# Patient Record
Sex: Male | Born: 1945
Health system: Southern US, Community
[De-identification: ages and names within clinical notes are randomized; demographics above are authoritative.]

## PROBLEM LIST (undated history)

## (undated) DIAGNOSIS — I219 Acute myocardial infarction, unspecified: Secondary | ICD-10-CM

## (undated) DIAGNOSIS — I2510 Atherosclerotic heart disease of native coronary artery without angina pectoris: Secondary | ICD-10-CM

## (undated) DIAGNOSIS — I739 Peripheral vascular disease, unspecified: Secondary | ICD-10-CM

## (undated) DIAGNOSIS — M1990 Unspecified osteoarthritis, unspecified site: Secondary | ICD-10-CM

## (undated) DIAGNOSIS — I639 Cerebral infarction, unspecified: Secondary | ICD-10-CM

## (undated) HISTORY — DX: Unspecified osteoarthritis, unspecified site: M19.90

## (undated) HISTORY — PX: CARDIAC CATHETERIZATION: SHX172

## (undated) HISTORY — PX: VASCULAR SURGERY: SHX849

## (undated) HISTORY — PX: CORONARY ARTERY BYPASS GRAFT: SHX141

---

## 2000-06-18 ENCOUNTER — Emergency Department (HOSPITAL_COMMUNITY): Admission: EM | Admit: 2000-06-18 | Discharge: 2000-06-18 | Payer: Self-pay | Admitting: Emergency Medicine

## 2005-03-29 ENCOUNTER — Ambulatory Visit (HOSPITAL_COMMUNITY): Admission: RE | Admit: 2005-03-29 | Discharge: 2005-03-29 | Payer: Self-pay | Admitting: Gastroenterology

## 2005-03-29 ENCOUNTER — Encounter (INDEPENDENT_AMBULATORY_CARE_PROVIDER_SITE_OTHER): Payer: Self-pay | Admitting: *Deleted

## 2009-12-24 DIAGNOSIS — I219 Acute myocardial infarction, unspecified: Secondary | ICD-10-CM

## 2009-12-24 HISTORY — DX: Acute myocardial infarction, unspecified: I21.9

## 2010-04-29 ENCOUNTER — Ambulatory Visit: Payer: Self-pay | Admitting: Internal Medicine

## 2010-04-29 ENCOUNTER — Inpatient Hospital Stay (HOSPITAL_COMMUNITY)
Admission: EM | Admit: 2010-04-29 | Discharge: 2010-05-06 | Payer: Self-pay | Source: Home / Self Care | Admitting: Emergency Medicine

## 2010-04-30 ENCOUNTER — Encounter: Payer: Self-pay | Admitting: Cardiovascular Disease

## 2010-04-30 ENCOUNTER — Ambulatory Visit: Payer: Self-pay | Admitting: Thoracic Surgery (Cardiothoracic Vascular Surgery)

## 2010-05-01 ENCOUNTER — Encounter: Payer: Self-pay | Admitting: Cardiovascular Disease

## 2010-05-08 ENCOUNTER — Ambulatory Visit: Payer: Self-pay | Admitting: Thoracic Surgery (Cardiothoracic Vascular Surgery)

## 2010-05-15 ENCOUNTER — Ambulatory Visit: Payer: Self-pay | Admitting: Thoracic Surgery (Cardiothoracic Vascular Surgery)

## 2010-05-23 ENCOUNTER — Ambulatory Visit: Payer: Self-pay | Admitting: Thoracic Surgery (Cardiothoracic Vascular Surgery)

## 2010-05-29 ENCOUNTER — Ambulatory Visit: Payer: Self-pay | Admitting: Thoracic Surgery (Cardiothoracic Vascular Surgery)

## 2010-05-29 ENCOUNTER — Encounter
Admission: RE | Admit: 2010-05-29 | Discharge: 2010-05-29 | Payer: Self-pay | Admitting: Thoracic Surgery (Cardiothoracic Vascular Surgery)

## 2010-06-01 ENCOUNTER — Encounter (HOSPITAL_COMMUNITY)
Admission: RE | Admit: 2010-06-01 | Discharge: 2010-08-30 | Payer: Self-pay | Source: Home / Self Care | Admitting: Interventional Cardiology

## 2010-08-24 ENCOUNTER — Ambulatory Visit (HOSPITAL_COMMUNITY): Admission: RE | Admit: 2010-08-24 | Discharge: 2010-08-24 | Payer: Self-pay | Admitting: Interventional Cardiology

## 2010-09-04 ENCOUNTER — Ambulatory Visit: Payer: Self-pay | Admitting: Surgery

## 2010-09-22 ENCOUNTER — Ambulatory Visit: Payer: Self-pay | Admitting: Surgery

## 2010-09-22 ENCOUNTER — Inpatient Hospital Stay (HOSPITAL_COMMUNITY)
Admission: RE | Admit: 2010-09-22 | Discharge: 2010-09-26 | Payer: Self-pay | Source: Home / Self Care | Admitting: Surgery

## 2010-09-22 ENCOUNTER — Encounter: Payer: Self-pay | Admitting: Surgery

## 2010-09-22 HISTORY — PX: FEMORAL BYPASS: SHX50

## 2010-10-02 ENCOUNTER — Ambulatory Visit: Payer: Self-pay | Admitting: Surgery

## 2010-10-16 ENCOUNTER — Ambulatory Visit: Payer: Self-pay | Admitting: Surgery

## 2010-11-06 ENCOUNTER — Ambulatory Visit: Payer: Self-pay | Admitting: Surgery

## 2010-12-04 ENCOUNTER — Ambulatory Visit: Payer: Self-pay | Admitting: Surgery

## 2011-01-15 ENCOUNTER — Ambulatory Visit: Admit: 2011-01-15 | Payer: Self-pay | Admitting: Surgery

## 2011-01-15 ENCOUNTER — Ambulatory Visit
Admission: RE | Admit: 2011-01-15 | Discharge: 2011-01-15 | Payer: Self-pay | Source: Home / Self Care | Attending: Surgery | Admitting: Surgery

## 2011-01-16 NOTE — Assessment & Plan Note (Addendum)
OFFICE VISIT  [name], [name] DOB:  [birth year]                                       01/15/2011 JJOAC#:[id number]  The patient returns today for followup.  He is status post left femoral to above-knee popliteal bypass with reversed ipsilateral greater saphenous vein.  Simultaneously he underwent endarterectomy of left common femoral and profunda femoral artery for high-grade profunda femoral stenosis.  His postoperative  course was complicated by slight wound separation in the distal incision which has now subsequently healed.  He is back to work and without significant complaints.  He does have issues with some left leg swelling if he is on his feet all day.  I have given him a prescription for compression stockings; however, he has not been reliable in wearing these.  PHYSICAL EXAMINATION:  Heart rate 60, blood pressure 127/79, O2 sats 98%.  General:  Well-appearing, in no distress.  Respirations are nonlabored.  On his left leg, his incisions are well-healed.  He does have 1 to 2+ pitting edema of the left leg, no active ulceration.  DIAGNOSTIC STUDIES:  Ultrasound was performed today which shows an ABI of 1.2 on the left and 1.2 on the right.  Bypass graft is widely patent without evidence of stenosis.  ASSESSMENT:  Status post left leg bypass.  PLAN:  The patient will be placed on our ultrasound protocol.  I will plan on seeing him back in 6 months.  I have again reiterated to him to wear his compression stockings or at least keep his leg elevated when he is on his feet all day.    Jorge Ny, MD Electronically Signed  VWB/MEDQ  D:  01/15/2011  T:  01/16/2011  Job:  [id number]  cc:   Corky Crafts, MD

## 2011-01-18 IMAGING — CR DG CHEST 1V PORT
1 series · 1 of 1 positions shown · non-contrast
Comparison: 09/12/2010 and 05/29/2010

CLINICAL DATA: Elevated temperature.  Claudication.

PORTABLE CHEST - 1 VIEW

[AP]
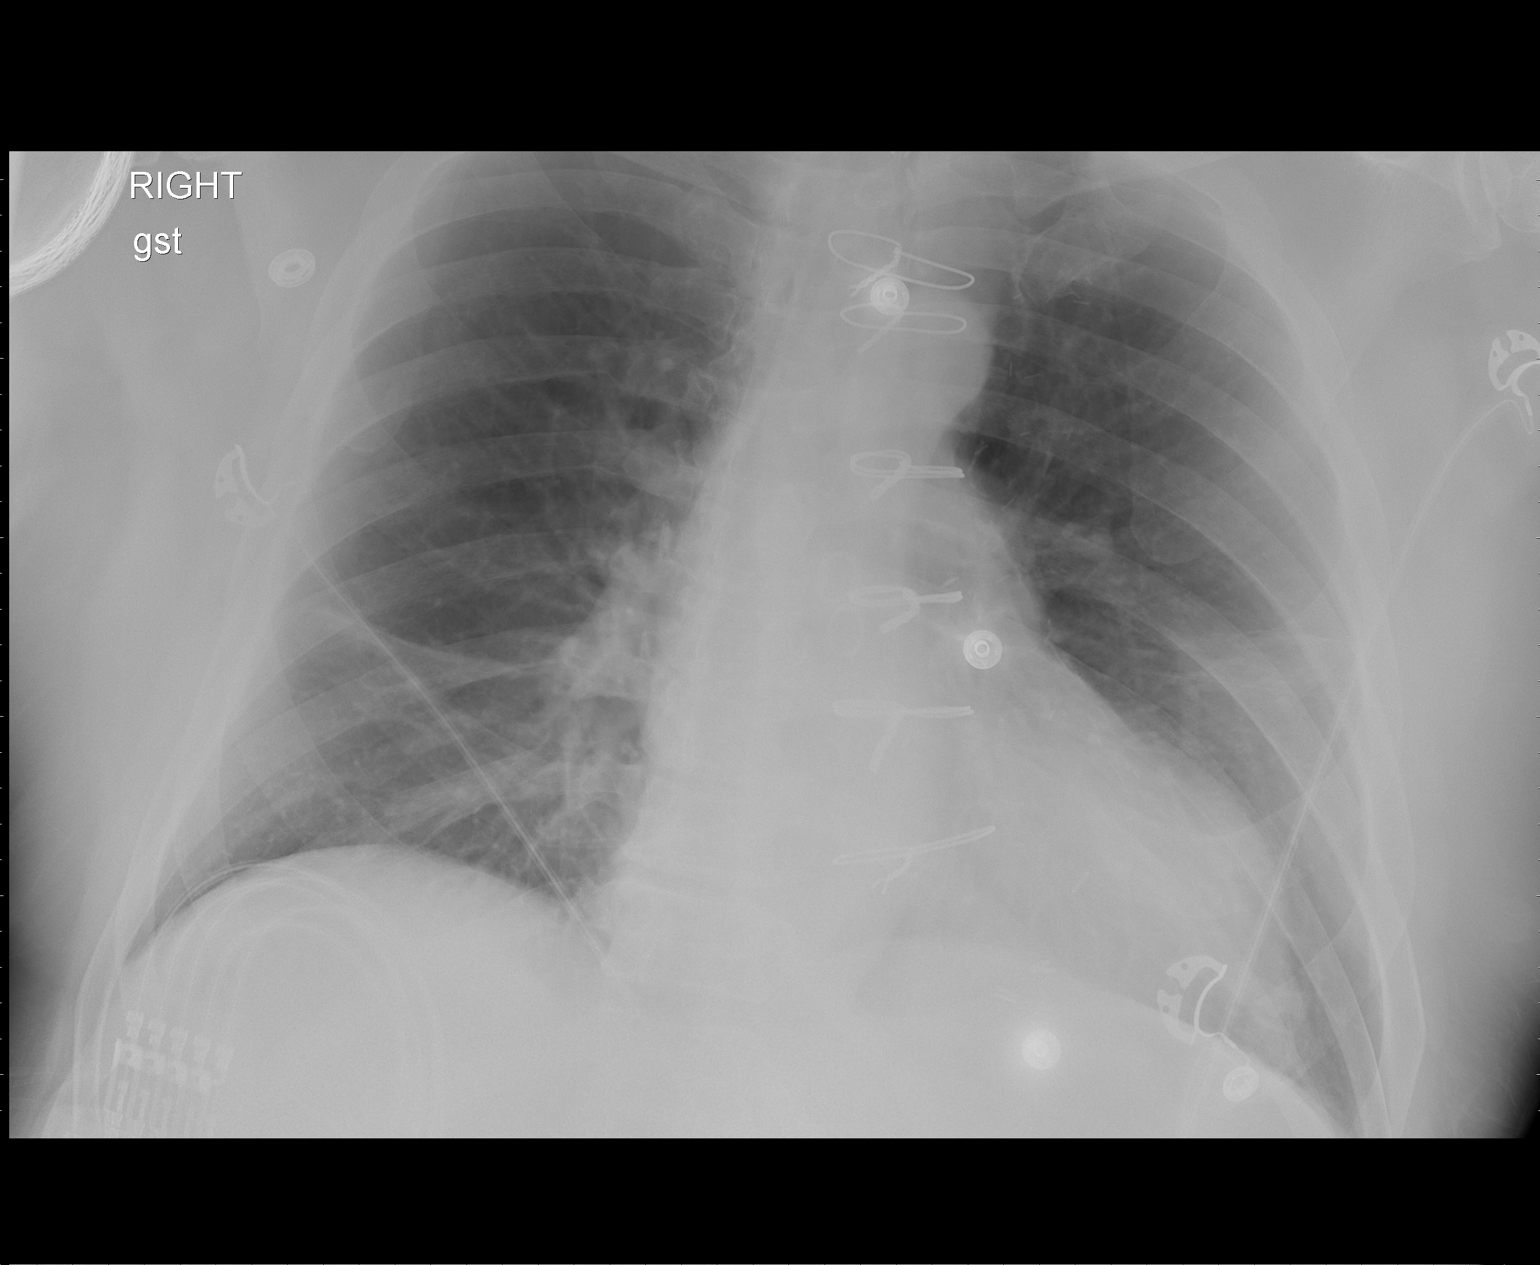

[1 of 1 positions shown; findings below may reference images not displayed]

FINDINGS: Single view of the chest demonstrates a median
sternotomy.  Streaky densities in the left mid lung and right lower
lung.  Findings may represent subsegmental atelectasis.  No
evidence for pulmonary edema.  Heart size is upper limits of normal
but stable.  Osseous structures are intact.
IMPRESSION: Bilateral streaky linear densities that may represent atelectasis.

## 2011-01-22 NOTE — Procedures (Unsigned)
BYPASS GRAFT EVALUATION  INDICATION:  Left lower extremity bypass.  HISTORY: Diabetes:  No. Cardiac:  Yes. Hypertension:  Yes. Smoking:  Quit 14 years ago. Previous Surgery:  Left fem-pop graft 09/22/2010.  SINGLE LEVEL ARTERIAL EXAM                              RIGHT              LEFT Brachial:                    136                138 Anterior tibial:             153                159 Posterior tibial:            169                171 Peroneal: Ankle/brachial index:        1.22               1.24  PREVIOUS ABI:  Date:  10/02/2010  RIGHT:  1.16  LEFT:  1.08  LOWER EXTREMITY BYPASS GRAFT DUPLEX EXAM:  DUPLEX:  Widely patent left fem-pop graft without evidence of restenosis or hyperplasia although a distal anastomosis is difficult to visualize. Triphasic waveforms throughout.  IMPRESSION: 1. Patent left femoral-popliteal graft. 2. Normal ankle brachial indices bilaterally.  ___________________________________________ V. Charlena Cross, MD  LT/MEDQ  D:  01/15/2011  T:  01/15/2011  Job:  [id number]

## 2011-01-24 DIAGNOSIS — I639 Cerebral infarction, unspecified: Secondary | ICD-10-CM

## 2011-01-24 HISTORY — DX: Cerebral infarction, unspecified: I63.9

## 2011-01-28 ENCOUNTER — Emergency Department (HOSPITAL_COMMUNITY): Payer: Medicare Other

## 2011-01-28 ENCOUNTER — Inpatient Hospital Stay (HOSPITAL_COMMUNITY)
Admission: EM | Admit: 2011-01-28 | Discharge: 2011-02-07 | DRG: 246 | Disposition: A | Discharge status: Rehabilitation Facility | Payer: Medicare Other | Attending: Interventional Cardiology | Admitting: Interventional Cardiology

## 2011-01-28 DIAGNOSIS — R079 Chest pain, unspecified: Secondary | ICD-10-CM

## 2011-01-28 DIAGNOSIS — Z951 Presence of aortocoronary bypass graft: Secondary | ICD-10-CM

## 2011-01-28 DIAGNOSIS — Z7902 Long term (current) use of antithrombotics/antiplatelets: Secondary | ICD-10-CM

## 2011-01-28 DIAGNOSIS — I2510 Atherosclerotic heart disease of native coronary artery without angina pectoris: Principal | ICD-10-CM | POA: Diagnosis present

## 2011-01-28 DIAGNOSIS — IMO0002 Reserved for concepts with insufficient information to code with codable children: Secondary | ICD-10-CM | POA: Diagnosis present

## 2011-01-28 DIAGNOSIS — I739 Peripheral vascular disease, unspecified: Secondary | ICD-10-CM | POA: Diagnosis present

## 2011-01-28 DIAGNOSIS — I6340 Cerebral infarction due to embolism of unspecified cerebral artery: Secondary | ICD-10-CM | POA: Diagnosis not present

## 2011-01-28 DIAGNOSIS — G8190 Hemiplegia, unspecified affecting unspecified side: Secondary | ICD-10-CM | POA: Diagnosis not present

## 2011-01-28 DIAGNOSIS — Z7982 Long term (current) use of aspirin: Secondary | ICD-10-CM

## 2011-01-28 DIAGNOSIS — E119 Type 2 diabetes mellitus without complications: Secondary | ICD-10-CM | POA: Diagnosis present

## 2011-01-28 DIAGNOSIS — I10 Essential (primary) hypertension: Secondary | ICD-10-CM | POA: Diagnosis present

## 2011-01-28 LAB — DIFFERENTIAL
Basophils Relative: 0 % (ref 0–1)
Eosinophils Absolute: 0.3 10*3/uL (ref 0.0–0.7)
Eosinophils Relative: 5 % (ref 0–5)
Monocytes Absolute: 0.8 10*3/uL (ref 0.1–1.0)
Monocytes Relative: 15 % — ABNORMAL HIGH (ref 3–12)

## 2011-01-28 LAB — COMPREHENSIVE METABOLIC PANEL
CO2: 29 mEq/L (ref 19–32)
Calcium: 9 mg/dL (ref 8.4–10.5)
Creatinine, Ser: 1.26 mg/dL (ref 0.4–1.5)
GFR calc Af Amer: >60 mL/min (ref >60)
GFR calc non Af Amer: 57 mL/min — ABNORMAL LOW (ref >60)
Glucose, Bld: 108 mg/dL — ABNORMAL HIGH (ref 70–99)

## 2011-01-28 LAB — CBC
Hemoglobin: 15.2 g/dL (ref 13.0–17.0)
MCH: 30.5 pg (ref 26.0–34.0)
MCHC: 33 g/dL (ref 30.0–36.0)
RDW: 14.1 % (ref 11.5–15.5)

## 2011-01-28 LAB — CK TOTAL AND CKMB (NOT AT ARMC)
CK, MB: 3.4 ng/mL (ref 0.3–4.0)
Total CK: 180 U/L (ref 7–232)

## 2011-01-28 LAB — URINALYSIS, ROUTINE W REFLEX MICROSCOPIC
Bilirubin Urine: NEGATIVE
Hgb urine dipstick: NEGATIVE
Protein, ur: NEGATIVE mg/dL
Urine Glucose, Fasting: NEGATIVE mg/dL

## 2011-01-28 LAB — POCT CARDIAC MARKERS
Myoglobin, poc: 134 ng/mL (ref 12–200)
Troponin i, poc: <0.05 ng/mL (ref 0.00–0.09)

## 2011-01-28 LAB — PROTIME-INR: Prothrombin Time: 13.3 seconds (ref 11.6–15.2)

## 2011-01-28 LAB — TROPONIN I: Troponin I: 0.02 ng/mL (ref 0.00–0.06)

## 2011-01-29 ENCOUNTER — Inpatient Hospital Stay (HOSPITAL_COMMUNITY): Payer: Medicare Other

## 2011-01-29 LAB — HEMOGLOBIN A1C
Hgb A1c MFr Bld: 6.4 % — ABNORMAL HIGH (ref <5.7)
Mean Plasma Glucose: 137 mg/dL — ABNORMAL HIGH (ref <117)

## 2011-01-29 LAB — CARDIAC PANEL(CRET KIN+CKTOT+MB+TROPI)
CK, MB: 2.2 ng/mL (ref 0.3–4.0)
Relative Index: 1.6 (ref 0.0–2.5)
Relative Index: 1.6 (ref 0.0–2.5)
Troponin I: 0.01 ng/mL (ref 0.00–0.06)

## 2011-01-29 LAB — BASIC METABOLIC PANEL
BUN: 9 mg/dL (ref 6–23)
BUN: 8 mg/dL (ref 6–23)
Calcium: 8.7 mg/dL (ref 8.4–10.5)
Calcium: 8.5 mg/dL (ref 8.4–10.5)
Creatinine, Ser: 1.25 mg/dL (ref 0.4–1.5)
GFR calc non Af Amer: 59 mL/min — ABNORMAL LOW (ref >60)
GFR calc non Af Amer: 58 mL/min — ABNORMAL LOW (ref >60)
Glucose, Bld: 103 mg/dL — ABNORMAL HIGH (ref 70–99)

## 2011-01-29 LAB — GLUCOSE, CAPILLARY
Glucose-Capillary: 92 mg/dL (ref 70–99)
Glucose-Capillary: 109 mg/dL — ABNORMAL HIGH (ref 70–99)

## 2011-01-29 LAB — PROTIME-INR
INR: 1.91 — ABNORMAL HIGH (ref 0.00–1.49)
Prothrombin Time: 22 seconds — ABNORMAL HIGH (ref 11.6–15.2)

## 2011-01-29 LAB — CBC
HCT: 44.2 % (ref 39.0–52.0)
Hemoglobin: 14.2 g/dL (ref 13.0–17.0)
Platelets: 218 10*3/uL (ref 150–400)
RBC: 4.78 MIL/uL (ref 4.22–5.81)
RDW: 14.1 % (ref 11.5–15.5)
WBC: 6.5 10*3/uL (ref 4.0–10.5)

## 2011-01-29 LAB — LIPID PANEL
Cholesterol: 138 mg/dL (ref 0–200)
HDL: 35 mg/dL — ABNORMAL LOW (ref >39)
LDL Cholesterol: 86 mg/dL (ref 0–99)
Total CHOL/HDL Ratio: 3.9 RATIO
Triglycerides: 85 mg/dL (ref <150)

## 2011-01-29 LAB — MRSA PCR SCREENING: MRSA by PCR: NEGATIVE

## 2011-01-29 LAB — APTT: aPTT: 68 seconds — ABNORMAL HIGH (ref 24–37)

## 2011-01-30 ENCOUNTER — Inpatient Hospital Stay (HOSPITAL_COMMUNITY): Payer: Medicare Other

## 2011-01-30 LAB — LIPID PANEL
LDL Cholesterol: 100 mg/dL — ABNORMAL HIGH (ref 0–99)
Triglycerides: 74 mg/dL (ref <150)
VLDL: 15 mg/dL (ref 0–40)

## 2011-01-30 LAB — BASIC METABOLIC PANEL
Calcium: 8.8 mg/dL (ref 8.4–10.5)
Creatinine, Ser: 1.27 mg/dL (ref 0.4–1.5)
GFR calc Af Amer: >60 mL/min (ref >60)
GFR calc non Af Amer: 57 mL/min — ABNORMAL LOW (ref >60)
Glucose, Bld: 85 mg/dL (ref 70–99)
Sodium: 138 mEq/L (ref 135–145)

## 2011-01-30 LAB — GLUCOSE, CAPILLARY
Glucose-Capillary: 83 mg/dL (ref 70–99)
Glucose-Capillary: 79 mg/dL (ref 70–99)
Glucose-Capillary: 67 mg/dL — ABNORMAL LOW (ref 70–99)
Glucose-Capillary: 88 mg/dL (ref 70–99)
Glucose-Capillary: 68 mg/dL — ABNORMAL LOW (ref 70–99)

## 2011-01-30 LAB — CBC
MCH: 30.5 pg (ref 26.0–34.0)
MCHC: 33.3 g/dL (ref 30.0–36.0)
RDW: 14.3 % (ref 11.5–15.5)

## 2011-01-30 LAB — POCT ACTIVATED CLOTTING TIME: Activated Clotting Time: 629 seconds

## 2011-01-31 ENCOUNTER — Other Ambulatory Visit (HOSPITAL_COMMUNITY): Payer: MEDICARE

## 2011-01-31 LAB — CBC
MCH: 31.6 pg (ref 26.0–34.0)
MCHC: 34.4 g/dL (ref 30.0–36.0)
MCV: 91.9 fL (ref 78.0–100.0)
Platelets: 229 10*3/uL (ref 150–400)
RDW: 14 % (ref 11.5–15.5)

## 2011-01-31 LAB — BASIC METABOLIC PANEL
BUN: 10 mg/dL (ref 6–23)
Calcium: 8.8 mg/dL (ref 8.4–10.5)
Chloride: 101 mEq/L (ref 96–112)
Creatinine, Ser: 1.29 mg/dL (ref 0.4–1.5)
GFR calc Af Amer: >60 mL/min (ref >60)

## 2011-01-31 LAB — GLUCOSE, CAPILLARY
Glucose-Capillary: 89 mg/dL (ref 70–99)
Glucose-Capillary: 92 mg/dL (ref 70–99)
Glucose-Capillary: 73 mg/dL (ref 70–99)

## 2011-01-31 LAB — MRSA PCR SCREENING: MRSA by PCR: NEGATIVE

## 2011-01-31 LAB — PROTIME-INR: Prothrombin Time: 13.8 seconds (ref 11.6–15.2)

## 2011-02-01 ENCOUNTER — Inpatient Hospital Stay (HOSPITAL_COMMUNITY): Payer: Medicare Other

## 2011-02-01 DIAGNOSIS — R209 Unspecified disturbances of skin sensation: Secondary | ICD-10-CM

## 2011-02-01 DIAGNOSIS — I69921 Dysphasia following unspecified cerebrovascular disease: Secondary | ICD-10-CM

## 2011-02-01 DIAGNOSIS — I69998 Other sequelae following unspecified cerebrovascular disease: Secondary | ICD-10-CM

## 2011-02-01 DIAGNOSIS — G8100 Flaccid hemiplegia affecting unspecified side: Secondary | ICD-10-CM

## 2011-02-01 LAB — GLUCOSE, CAPILLARY
Glucose-Capillary: 76 mg/dL (ref 70–99)
Glucose-Capillary: 80 mg/dL (ref 70–99)
Glucose-Capillary: 86 mg/dL (ref 70–99)
Glucose-Capillary: 89 mg/dL (ref 70–99)
Glucose-Capillary: 93 mg/dL (ref 70–99)

## 2011-02-01 MED ORDER — IOHEXOL 300 MG/ML  SOLN
200.0000 mL | Freq: Once | INTRAMUSCULAR | Status: AC | PRN
  Administered 2011-02-01: 110 mL via INTRAVENOUS

## 2011-02-02 LAB — GLUCOSE, CAPILLARY
Glucose-Capillary: 118 mg/dL — ABNORMAL HIGH (ref 70–99)
Glucose-Capillary: 109 mg/dL — ABNORMAL HIGH (ref 70–99)
Glucose-Capillary: 110 mg/dL — ABNORMAL HIGH (ref 70–99)
Glucose-Capillary: 185 mg/dL — ABNORMAL HIGH (ref 70–99)

## 2011-02-03 LAB — BASIC METABOLIC PANEL
CO2: 26 mEq/L (ref 19–32)
Calcium: 8.7 mg/dL (ref 8.4–10.5)
Chloride: 106 mEq/L (ref 96–112)
Creatinine, Ser: 1.31 mg/dL (ref 0.4–1.5)
Glucose, Bld: 137 mg/dL — ABNORMAL HIGH (ref 70–99)
Sodium: 138 mEq/L (ref 135–145)

## 2011-02-03 LAB — GLUCOSE, CAPILLARY
Glucose-Capillary: 127 mg/dL — ABNORMAL HIGH (ref 70–99)
Glucose-Capillary: 137 mg/dL — ABNORMAL HIGH (ref 70–99)
Glucose-Capillary: 119 mg/dL — ABNORMAL HIGH (ref 70–99)
Glucose-Capillary: 119 mg/dL — ABNORMAL HIGH (ref 70–99)

## 2011-02-03 LAB — CBC
HCT: 45.4 % (ref 39.0–52.0)
Hemoglobin: 15.7 g/dL (ref 13.0–17.0)
MCH: 31.2 pg (ref 26.0–34.0)
MCHC: 34.6 g/dL (ref 30.0–36.0)
RBC: 5.04 MIL/uL (ref 4.22–5.81)

## 2011-02-04 LAB — GLUCOSE, CAPILLARY
Glucose-Capillary: 126 mg/dL — ABNORMAL HIGH (ref 70–99)
Glucose-Capillary: 92 mg/dL (ref 70–99)

## 2011-02-04 NOTE — H&P (Signed)
NAME:  [name], [name] NO.:  [id number]  MEDICAL RECORD NO.:  [id number]           PATIENT TYPE:  I  LOCATION:  2025                         FACILITY:  [facility]  PHYSICIAN:  Marca Ancona, MD      DATE OF BIRTH:  02-15-1946  DATE OF ADMISSION:  01/28/2011 DATE OF DISCHARGE:                             HISTORY & PHYSICAL   PRIMARY CARDIOLOGIST:  Lyn Records, MD  PRIMARY CARE PHYSICIAN:  Georgann Housekeeper, MD.  HISTORY OF PRESENT ILLNESS:  This is a 64-year with history of hypertension, diabetes, peripheral arterial disease and coronary artery disease, status post bypass surgery in May 2011 who presents with chest pain.  For the last 2-3 days, the patient has had occasional episodes of chest tightness that been mild and nonexertional for the most part. However, all morning today, the patient had very mild 2/10 chest tightness that came and went.  This seemed to resolve by the afternoon. However, tonight the patient ate a salad at [employer] __________ then went outside to feed his dog.  While walking back inside, after this, he developed 5/10 chest tightness similar to his prior ischemic pain but not as severe.  EMS came.  The patient was given one nitroglycerin, which helped but did not resolve the pain.  He came to the ER where his pain resolved after a total of three sublingual nitroglycerin.  He is currently completely chest painfree.  ALLERGIES:  No known drug allergies.  MEDICATIONS: 1. Glipizide 2.5 mg b.i.d. 2. Lipitor 20 mg daily. 3. Atenolol 50 mg daily. 4. Aspirin 325 mg daily.  PAST MEDICAL HISTORY: 1. Hypertension. 2. Diabetes. 3. Hyperlipidemia. 4. Peripheral arterial disease. 5. The patient status post left femoral-popliteal bypass. 6. He is also status post endarterectomy at the left common femoral     and profunda femoris arteries. 7. Echocardiogram in May 2011 showed EF 55-60% with normal wall motion 8. Coronary artery disease.   The patient had an inferior MI with     angioplasty in 1990s.  He was admitted with a STEMI in May 2011     likely due to plaque rupture in the circumflex.  He was found on     cath to have severe three-vessel disease and was taken to bypass     surgery with a LIMA to LAD, vein graft to diagonal, vein graft to     obtuse marginal, and sequential saphenous vein graft to the acute     marginal, and the PDA.  SOCIAL HISTORY:  The patient lives in [place] with his wife.  He owns his own business.  He does not smoke or drink any alcohol.  FAMILY HISTORY:  Noncontributory.  REVIEW OF SYSTEMS:  All systems were reviewed and were negative except as noted in history of present illness.  PHYSICAL EXAMINATION:  VITAL SIGNS:  Temperature is 98.6, pulse is in 60s to 70s, blood pressure initially 167/75, decreased to 134/86, oxygen saturation 99% on room air. GENERAL:  A well-developed man in no apparent distress. HEENT:  Normal exam. ABDOMEN:  Soft, nontender.  No hepatosplenomegaly.  Normal bowel sounds. EXTREMITIES:  No clubbing  or cyanosis. NECK:  There is no thyromegaly or thyroid nodule.  There is no JVD. CARDIOVASCULAR:  Heart regular S1 and S2.  No S3, no S4.  There is no murmur.  There is 1+ edema to the knee on the left.  This has been chronic since surgery.  There is minimal edema on the right.  The patient has 2+ posterior tibial pulses bilaterally. LUNGS:  Clear to auscultation bilaterally with normal respiratory effort. NEUROLOGIC:  Alert and orient x3.  Normal affect. SKIN:  Normal exam.  RADIOLOGY:  Chest x-ray shows clear lungs.  EKG shows normal sinus rhythm with old inferior and anterior myocardial infarctions.  The anterior MI is new compared to the May 2011 EKG.  LABORATORY DATA:  Hematocrit 46, potassium 4.3, creatinine 1.26.  First set of cardiac markers is negative.  IMPRESSION:  A 65 year old with history of diabetes, hypertension, peripheral arterial disease,  and coronary artery disease, status post coronary artery bypass grafting who presents with chest pain, worrisome for unstable angina. 1. Chest pain.  This is possible unstable angina.  The patient is     currently chest painfree.  He has had one set of negative cardiac     enzymes.  EKG shows old inferior and anterior myocardial     infarctions.  I am concerned that the pain feels like his prior     myocardial infarction in May though not severe.  We will plan on     cycling his cardiac enzymes as well as EKGs.  We are going to admit     him to a telemetry bed.  We will start him on aspirin, heparin     drip, and continue on a statin and beta-blocker.  We will keep him     n.p.o. now for either a catheterization or Myoview in the morning.     Given the similarity of his symptoms to prior ischemic symptoms and     the relief on nitroglycerin, I think that a catheterization would     certainly be reasonable. 2. Peripheral arterial disease.  This seems to be stable.  The patient     has goo peripheral pulses.  He has had left greater than right     lower leg edema since his last vascular surgery on the left leg,     and there has been no change in this.     Marca Ancona, MD     DM/MEDQ  D:  01/28/2011  T:  01/29/2011  Job:  [id number]  Electronically Signed by Marca Ancona MD on 02/01/2011 08:50:50 AM

## 2011-02-05 ENCOUNTER — Inpatient Hospital Stay (HOSPITAL_COMMUNITY): Payer: Medicare Other

## 2011-02-05 LAB — GLUCOSE, CAPILLARY
Glucose-Capillary: 115 mg/dL — ABNORMAL HIGH (ref 70–99)
Glucose-Capillary: 131 mg/dL — ABNORMAL HIGH (ref 70–99)
Glucose-Capillary: 94 mg/dL (ref 70–99)

## 2011-02-05 LAB — BASIC METABOLIC PANEL
BUN: 16 mg/dL (ref 6–23)
Calcium: 8.6 mg/dL (ref 8.4–10.5)
Chloride: 111 mEq/L (ref 96–112)
Creatinine, Ser: 1.14 mg/dL (ref 0.4–1.5)
GFR calc non Af Amer: >60 mL/min (ref >60)

## 2011-02-06 LAB — GLUCOSE, CAPILLARY
Glucose-Capillary: 92 mg/dL (ref 70–99)
Glucose-Capillary: 97 mg/dL (ref 70–99)

## 2011-02-06 NOTE — Procedures (Signed)
NAME:  [name], [name]            ACCOUNT NO.:  [id number]  MEDICAL RECORD NO.:  [id number]           PATIENT TYPE:  I  LOCATION:  2912                         FACILITY:  [facility]  PHYSICIAN:  Lyn Records, M.D.   DATE OF BIRTH:  [birth year]  DATE OF PROCEDURE:  01/29/2011 DATE OF DISCHARGE:                           CARDIAC CATHETERIZATION   INDICATION FOR THE PROCEDURE:  Recurrent chest pain, post bypass surgery with sequential saphenous vein graft to the right coronary, saphenous vein graft to the diagonal, saphenous vein graft to the OM, and LIMA to the LAD in May 2011.  PROCEDURES PERFORMED: 1. Left heart cath. 2. Coronary angio. 3. Left ventriculography. 4. Bypass graft angiography. 5. LIMA angiography. 6. PTCA and stent using cutting balloon and stent to treat diagonal     LAD bifurcation disease in unprotected LAD territory.  DESCRIPTION:  After informed consent, the patient was brought to the cath lab where the right iliofemoral region was sterilely prepped and draped.  A 5-French sheath was placed in the right femoral artery.  We then performed diagnostic left heart catheterization, left ventriculography, and bypass graft angiography using a 5 F A2 multipurpose catheter.  We used a 5F internal mammary catheter for left internal mammary artery angiography.  He was given 1 mg of Versed and 50 mcg of fentanyl intravenously at the beginning of the case.  The patient became very groggy, started snoring, became hypoxic requiring O2 therapy, and began having involuntary arm and leg movement.  We gave additional Versed which seemed to make things worse.  We ultimately gave Romazicon 0.4 mg IV to reverse Versed(see below). This did incompletely improve the restlessness. He was arousable and able to follow commands.  We decided to perform PCI of his left anterior descending/diagonal after we identified that the LIMA to the LAD was atretic, the saphenous vein graft to  the diagonal was occluded. We also noted the saphenous vein graft to the right coronary had moderate to severe mid vessel obstruction. We discussed the findings and plans for stenting the LAD/diagonal with the patient before proceeding.  We used an XB LAD 6-French #4 guide catheter.  We used a two-wire technique to protect the diagonal and LAD.  Angiomax bolus followed by an infusion produced an ACT greater than 300.  Plavix 600 mg was administered orally.  We performed cutting balloon angioplasty using a 2.25 x 10- mm long cutting balloon.  We then did angioplasty followed by stenting of the LAD.  A 3.0 Promus Element stent was deployed in the LAD.  The diagonal was not treated with stenting.  We postdilated to 3.5 using a Poneto apex balloon to 16 atmospheres.    During the PCI, the patient became increasingly difficult to control on the table.  He could not control movement of his legs. We gave Romazicon during the PCI in an attempt to reverse the Versed thinking that he was having an adverse reaction (as noted above). The procedure was completed with difficulty and angio-Seal  was performed postprocedure to facilitate groin hemostasis with good result.  RESULTS: 1. Hemodynamic data:     a.  Aortic pressure 131/80.     b.     Left ventricular pressure 134/90 mmHg. 2. Left ventriculography:  LV cavity size is dilated.  Inferior wall     was severely hypokinetic.  EF was 45%. 3. Coronary angiography:     a.     Left main coronary:  50% distal stenosis.     b.     Left anterior descending coronary artery:  Left anterior      descending coronary artery contains 80% proximal eccentric      stenosis.  The diagonal that arises from this territory contains      90% segmental ostial stenosis and 90% mid. The distal LAD contains      90% obstruction.     c.     Circumflex artery:  99% proximal. Competitive flow from the patent graft was noted.     d.     Right coronary:  99% mid with  competitive flow distally. 4. Coronary angiography:     a.     Saphenous vein graft sequential to the RCA:  This graft      contains 70% mid graft body stenosis.     b.     Saphenous vein graft to the diagonal:  100% aorto-ostial.     c.     Saphenous vein graft to the OM:  Widely patent. 5. LIMA to LAD:  Occluded in the misd vessel. 6. PCI of the LAD diagonal:  The diagonal was angioplastied from 95%     to 25% using a 2.25 x 10-mm long cutting balloon.  One balloon     inflation for 1 minute 30 seconds was performed.  The LAD contained     85% stenosis and was reduced to 0% after stenting with a Promus     Element 3.0 stent, postdilated to 3.90mm.  RESULTS: 1. Coronary atherosclerotic heart disease with occlusion of the     saphenous vein graft to the diagonal, and 70% stenosis of the     saphenous vein graft to the right coronary. 2. Atretic LIMA to LAD. 3. 80-85% proximal LAD, 90% first diagonal with distal disease in the LAD     as well.  The proximal circumflex has high-grade disease, but is     grafted.  The right coronary is severely diseased, but has a patent     graft, although there is moderate and perhaps significant stenosis     in the mid graft. 4. Successful drug-eluting stent LAD.  Successful cutting balloon     angioplasty diagonal #1. 5. Left ventricular dysfunction. 6. Postprocedure neurological abnormalities were noted.  I was     concerned that the patient developed an adverse reaction to     medications, although we have called a code stroke to rule out the     possibility of embolic CVA related to his cath.  PLAN: 1. Emergency CT. 2. Neurology. 3. Continue aspirin and Plavix. 4. ICU after scanning. 5. Discuss with family in full disclosure.     Lyn Records, M.D.     HWS/MEDQ  D:  01/29/2011  T:  01/30/2011  Job:  [id number]  cc:   Georgann Housekeeper, MD  Electronically Signed by Verdis Prime M.D. on 01/31/2011 04:54:09 PM

## 2011-02-07 ENCOUNTER — Inpatient Hospital Stay (HOSPITAL_COMMUNITY)
Admission: RE | Admit: 2011-02-07 | Discharge: 2011-03-07 | DRG: 945 | Disposition: A | Discharge status: Home / Self Care | Payer: Medicare Other | Source: Other Acute Inpatient Hospital | Attending: Physical Medicine & Rehabilitation | Admitting: Physical Medicine & Rehabilitation

## 2011-02-07 DIAGNOSIS — Z7982 Long term (current) use of aspirin: Secondary | ICD-10-CM

## 2011-02-07 DIAGNOSIS — M109 Gout, unspecified: Secondary | ICD-10-CM

## 2011-02-07 DIAGNOSIS — Z7902 Long term (current) use of antithrombotics/antiplatelets: Secondary | ICD-10-CM

## 2011-02-07 DIAGNOSIS — Z8249 Family history of ischemic heart disease and other diseases of the circulatory system: Secondary | ICD-10-CM

## 2011-02-07 DIAGNOSIS — R1310 Dysphagia, unspecified: Secondary | ICD-10-CM

## 2011-02-07 DIAGNOSIS — Z951 Presence of aortocoronary bypass graft: Secondary | ICD-10-CM

## 2011-02-07 DIAGNOSIS — I6322 Cerebral infarction due to unspecified occlusion or stenosis of basilar arteries: Secondary | ICD-10-CM

## 2011-02-07 DIAGNOSIS — I6330 Cerebral infarction due to thrombosis of unspecified cerebral artery: Secondary | ICD-10-CM

## 2011-02-07 DIAGNOSIS — R29810 Facial weakness: Secondary | ICD-10-CM

## 2011-02-07 DIAGNOSIS — I2510 Atherosclerotic heart disease of native coronary artery without angina pectoris: Secondary | ICD-10-CM

## 2011-02-07 DIAGNOSIS — G8190 Hemiplegia, unspecified affecting unspecified side: Secondary | ICD-10-CM

## 2011-02-07 DIAGNOSIS — Z833 Family history of diabetes mellitus: Secondary | ICD-10-CM

## 2011-02-07 DIAGNOSIS — R4789 Other speech disturbances: Secondary | ICD-10-CM

## 2011-02-07 DIAGNOSIS — Z87891 Personal history of nicotine dependence: Secondary | ICD-10-CM

## 2011-02-07 DIAGNOSIS — E669 Obesity, unspecified: Secondary | ICD-10-CM

## 2011-02-07 DIAGNOSIS — E785 Hyperlipidemia, unspecified: Secondary | ICD-10-CM

## 2011-02-07 DIAGNOSIS — Z5189 Encounter for other specified aftercare: Principal | ICD-10-CM

## 2011-02-07 LAB — GLUCOSE, CAPILLARY
Glucose-Capillary: 101 mg/dL — ABNORMAL HIGH (ref 70–99)
Glucose-Capillary: 115 mg/dL — ABNORMAL HIGH (ref 70–99)
Glucose-Capillary: 97 mg/dL (ref 70–99)

## 2011-02-07 LAB — URINALYSIS, ROUTINE W REFLEX MICROSCOPIC
Hgb urine dipstick: NEGATIVE
Nitrite: NEGATIVE
Specific Gravity, Urine: 1.019 (ref 1.005–1.030)
Urobilinogen, UA: 1 mg/dL (ref 0.0–1.0)

## 2011-02-08 DIAGNOSIS — G8100 Flaccid hemiplegia affecting unspecified side: Secondary | ICD-10-CM

## 2011-02-08 DIAGNOSIS — I6340 Cerebral infarction due to embolism of unspecified cerebral artery: Secondary | ICD-10-CM

## 2011-02-08 LAB — DIFFERENTIAL
Basophils Relative: 1 % (ref 0–1)
Eosinophils Absolute: 0.4 10*3/uL (ref 0.0–0.7)
Eosinophils Relative: 6 % — ABNORMAL HIGH (ref 0–5)
Monocytes Absolute: 0.6 10*3/uL (ref 0.1–1.0)
Monocytes Relative: 10 % (ref 3–12)
Neutrophils Relative %: 62 % (ref 43–77)

## 2011-02-08 LAB — COMPREHENSIVE METABOLIC PANEL
AST: 34 U/L (ref 0–37)
Albumin: 2.8 g/dL — ABNORMAL LOW (ref 3.5–5.2)
Calcium: 8.8 mg/dL (ref 8.4–10.5)
Creatinine, Ser: 1.43 mg/dL (ref 0.4–1.5)
GFR calc Af Amer: >60 mL/min (ref >60)

## 2011-02-08 LAB — GLUCOSE, CAPILLARY
Glucose-Capillary: 87 mg/dL (ref 70–99)
Glucose-Capillary: 103 mg/dL — ABNORMAL HIGH (ref 70–99)

## 2011-02-08 LAB — CBC
MCH: 31 pg (ref 26.0–34.0)
MCHC: 34.3 g/dL (ref 30.0–36.0)
Platelets: 322 10*3/uL (ref 150–400)
RDW: 13.3 % (ref 11.5–15.5)

## 2011-02-09 LAB — GLUCOSE, CAPILLARY
Glucose-Capillary: 96 mg/dL (ref 70–99)
Glucose-Capillary: 96 mg/dL (ref 70–99)
Glucose-Capillary: 81 mg/dL (ref 70–99)

## 2011-02-09 LAB — URINE CULTURE
Colony Count: >=100000
Culture  Setup Time: 201202160129

## 2011-02-10 LAB — GLUCOSE, CAPILLARY: Glucose-Capillary: 86 mg/dL (ref 70–99)

## 2011-02-11 LAB — GLUCOSE, CAPILLARY
Glucose-Capillary: 89 mg/dL (ref 70–99)
Glucose-Capillary: 119 mg/dL — ABNORMAL HIGH (ref 70–99)

## 2011-02-12 DIAGNOSIS — G8100 Flaccid hemiplegia affecting unspecified side: Secondary | ICD-10-CM

## 2011-02-12 DIAGNOSIS — I6330 Cerebral infarction due to thrombosis of unspecified cerebral artery: Secondary | ICD-10-CM

## 2011-02-12 LAB — GLUCOSE, CAPILLARY: Glucose-Capillary: 130 mg/dL — ABNORMAL HIGH (ref 70–99)

## 2011-02-13 LAB — BASIC METABOLIC PANEL
Calcium: 9.1 mg/dL (ref 8.4–10.5)
Creatinine, Ser: 1.36 mg/dL (ref 0.4–1.5)
GFR calc Af Amer: >60 mL/min (ref >60)
GFR calc non Af Amer: 53 mL/min — ABNORMAL LOW (ref >60)

## 2011-02-13 LAB — GLUCOSE, CAPILLARY
Glucose-Capillary: 107 mg/dL — ABNORMAL HIGH (ref 70–99)
Glucose-Capillary: 87 mg/dL (ref 70–99)

## 2011-02-14 LAB — GLUCOSE, CAPILLARY: Glucose-Capillary: 94 mg/dL (ref 70–99)

## 2011-02-14 NOTE — H&P (Signed)
NAME:  [name], [name]            ACCOUNT NO.:  [id number]  MEDICAL RECORD NO.:  [id number]           PATIENT TYPE:  I  LOCATION:  4039                         FACILITY:  [facility]  PHYSICIAN:  Erick Colace, M.D.DATE OF BIRTH:  May 13, 1946  DATE OF ADMISSION:  02/07/2011 DATE OF DISCHARGE:                             HISTORY & PHYSICAL   REASON FOR ADMISSION:  Right-sided weakness.  A 65 year old male with history of coronary artery disease, underwent a CABG in May 2011.  He was admitted on January 29, 2011, with chest pain, unstable angina, underwent cardiac cath revealing left IMA occlusion. He underwent stenting with Dr. Verdis Prime.  Postprocedure, noted to have right facial weakness, slurred speech.  Code stroke initiated.  MRI of the brain showed acute infarct throughout the left PCA and small infarcts in left cerebellum, bilateral thalami and right occipital lobes.  Carotid Dopplers showed no ICA stenosis.  MRA of the brain showed severe basal artery stenosis and cerebral angiogram showed a large basilar clot.  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  The patient had right great toe pain secondary to gout, started on IV Toradol for treatment.  The patient with dysphagia, initially n.p.o., upgraded to D2 nectar liquids.  Noted to have left gaze preference, right facial weakness, dense right hemiparesis.  REVIEW OF SYSTEMS:  Positive for double vision, right-sided numbness.  PAST HISTORY:  In addition to above, dyslipidemia, obesity, colonic polyps, left lower extremity claudication status post endarterectomy, fem-pop October 2011, diverticulosis, gout.  FAMILY HISTORY:  Diabetes and CAD.  SOCIAL HISTORY:  Married, lives in two-level home, three steps to enter. Bedroom on first level.  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  Quit tobacco 15 years ago.  Negative EtOH.  FUNCTIONAL HISTORY:  Independent prior to  admission.  Meds include atenolol, Lipitor, aspirin, glipizide.  His last BUN was 8, creatinine 1.26.  Sodium 140, potassium 4.3, hemoglobin 15.2, white count 5.1.  Hemoglobin A1c 6.4.  PHYSICAL EXAMINATION:  GENERAL:  This is an obese male in no acute distress.  He appears somnolent but easily arousable to voice. VITAL SIGNS:  His blood pressure is 141/90, pulse 60, respirations 18, temp 97.7. EYES:  Anicteric.  He has mild ptosis, left.  He has right facial droop. OROPHARYNX:  Some thrush on tongue, otherwise clear. NECK:  Supple without adenopathy. RESPIRATORY:  Effort is good. LUNGS:  Clear. HEART:  Regular rate and rhythm. ABDOMEN:  Mildly distended.  Positive bowel sounds. EXTREMITIES:  Pedal pulses and posterior tibial pulses normal.  He has edema bilateral lower extremities 1+ pretibial. MUSCULOSKELETAL:  Motor strength is 0/5 in the right deltoid, biceps, triceps, finger flexor, hip flexor, quad, TA, and gastroc.  He does have some flicker of adductor on the right side, but this is not reproducible.  Left side is 4/5 strength in left upper and left lower extremity. PSYCHIATRIC:  Orientation to person.  He is oriented to [facility] but thinks he is in short stay.  He has decreased memory, flat affect. NEUROLOGIC:  Sensation is reduced on the right side, although he can identify  which finger is touched but it takes him a long time and repeated testing to identify correctly toe or finger touched on the right.  POSTADMISSION PHYSICIAN EVALUATION: 1. 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  He has right     hemiparesis, right facial weakness, dysphagia, dysarthria,     cognitive deficits. 2. This patient admitted to receive collaborative interdisciplinary     care between the physiatrist, rehab nursing staff, and therapy     team. 3. The patient's level of medical complexity and substantial therapy      needs in context of that medical necessity cannot be provided at a     lesser intensity of care. 4. The patient has experienced potential functional loss from his     baseline.  Upon functional assessment at the time of preadmission     screening, the patient was not yet tested.  Upon functional     examination today, the patient was at a +3 total assist transfers     40% with lower extremities locked.  He has difficulty with holding     and sitting at midline.  Judging by the patient's diagnosis,     physical exam, functional history, the patient has potential for     functional progress which will result in measurable gains while in     inpatient rehab.  These gains will be of substantial and practical     use upon discharge to home in facilitating mobility, self-care, and     independence.  Interim changes in medical status since preadmission     screening are detailed in history of present illness. 5. Physiatrist will provide 24-hour management of medical needs as     well as oversight of the therapy plan/treatment and provide     guidance as appropriate regarding interaction of the two. 6. The 24-Hour Rehab Nursing will assess in management of skin, bowel,     bladder, help integrate therapy concepts, techniques, education. 7. PT will assess and treat for pre-gait training, gait training,     endurance, safety, sitting balance.  Goals are for min assist     wheelchair level, mobility. 8. OT will assess and treat for ADLs, cognitive perceptual skills,     transfers, toileting.  Goals are for min assist with ADLs including     toileting. 9. Speech and Language Pathology will assess and treat for dysphagia,     dysarthria, cognition.  Goals are for safe p.o. intake, 100% of     fluid and caloric needs. 10.Case Management and social worker will assess and treat for     psychosocial issues and discharge planning. 11.Team conference will be held weekly to assess the patient's      progress/goals and to determine barriers to discharge. 12.The patient has demonstrated sufficient medical stability and     exercise capacity to tolerate at least 3 hours of therapy per day     at least 5 days per week. 13.Estimated length of stay is 3-4 weeks.  Prognosis for further     functional improvement is fair to good.  MEDICAL PROBLEMS AND PLAN: 1. Diabetes.  Add glipizide 2.5 b.i.d. 2. Hypertension.  Atenolol 25 b.i.d.  We will let blood pressures run     a little higher on the systolic side given his low-flow state     through his basilar artery. 3. Somnolence.  Consider Ritalin, this is likely due to thalamic  infarcts. 4. Baseline artery thrombosis.  Plavix and aspirin. 5. Deep vein thrombosis prophylaxis.  Lovenox.  Discussed rehab medicine services with wife and the patient, questions answered.  The patient has adequate mood and motivation, although his somnolence may be an issue in terms of participating full in this rehab program.  We will monitor overtime.     Erick Colace, M.D.     AEK/MEDQ  D:  02/07/2011  T:  02/07/2011  Job:  [id number]  cc:   Georgann Housekeeper, MD Lyn Records, M.D. Pramod P. Pearlean Brownie, MD Everardo All Madilyn Fireman, M.D.  Electronically Signed by Claudette Laws M.D. on 02/14/2011 09:56:30 AM

## 2011-02-15 LAB — GLUCOSE, CAPILLARY
Glucose-Capillary: 104 mg/dL — ABNORMAL HIGH (ref 70–99)
Glucose-Capillary: 79 mg/dL (ref 70–99)

## 2011-02-16 DIAGNOSIS — G8100 Flaccid hemiplegia affecting unspecified side: Secondary | ICD-10-CM

## 2011-02-16 DIAGNOSIS — I6330 Cerebral infarction due to thrombosis of unspecified cerebral artery: Secondary | ICD-10-CM

## 2011-02-16 LAB — GLUCOSE, CAPILLARY: Glucose-Capillary: 140 mg/dL — ABNORMAL HIGH (ref 70–99)

## 2011-02-17 DIAGNOSIS — I6330 Cerebral infarction due to thrombosis of unspecified cerebral artery: Secondary | ICD-10-CM

## 2011-02-17 DIAGNOSIS — G8100 Flaccid hemiplegia affecting unspecified side: Secondary | ICD-10-CM

## 2011-02-17 LAB — GLUCOSE, CAPILLARY
Glucose-Capillary: 163 mg/dL — ABNORMAL HIGH (ref 70–99)
Glucose-Capillary: 92 mg/dL (ref 70–99)
Glucose-Capillary: 81 mg/dL (ref 70–99)

## 2011-02-18 LAB — GLUCOSE, CAPILLARY: Glucose-Capillary: 105 mg/dL — ABNORMAL HIGH (ref 70–99)

## 2011-02-19 LAB — GLUCOSE, CAPILLARY
Glucose-Capillary: 101 mg/dL — ABNORMAL HIGH (ref 70–99)
Glucose-Capillary: 101 mg/dL — ABNORMAL HIGH (ref 70–99)

## 2011-02-20 LAB — GLUCOSE, CAPILLARY
Glucose-Capillary: 99 mg/dL (ref 70–99)
Glucose-Capillary: 76 mg/dL (ref 70–99)

## 2011-02-21 LAB — GLUCOSE, CAPILLARY: Glucose-Capillary: 112 mg/dL — ABNORMAL HIGH (ref 70–99)

## 2011-02-22 DIAGNOSIS — I6330 Cerebral infarction due to thrombosis of unspecified cerebral artery: Secondary | ICD-10-CM

## 2011-02-22 DIAGNOSIS — G8100 Flaccid hemiplegia affecting unspecified side: Secondary | ICD-10-CM

## 2011-02-22 LAB — GLUCOSE, CAPILLARY
Glucose-Capillary: 106 mg/dL — ABNORMAL HIGH (ref 70–99)
Glucose-Capillary: 144 mg/dL — ABNORMAL HIGH (ref 70–99)
Glucose-Capillary: 76 mg/dL (ref 70–99)
Glucose-Capillary: 87 mg/dL (ref 70–99)

## 2011-02-23 DIAGNOSIS — F4321 Adjustment disorder with depressed mood: Secondary | ICD-10-CM

## 2011-02-23 DIAGNOSIS — I619 Nontraumatic intracerebral hemorrhage, unspecified: Secondary | ICD-10-CM

## 2011-02-23 LAB — GLUCOSE, CAPILLARY
Glucose-Capillary: 176 mg/dL — ABNORMAL HIGH (ref 70–99)
Glucose-Capillary: 59 mg/dL — ABNORMAL LOW (ref 70–99)
Glucose-Capillary: 73 mg/dL (ref 70–99)

## 2011-02-24 DIAGNOSIS — I6330 Cerebral infarction due to thrombosis of unspecified cerebral artery: Secondary | ICD-10-CM

## 2011-02-24 DIAGNOSIS — G8100 Flaccid hemiplegia affecting unspecified side: Secondary | ICD-10-CM

## 2011-02-24 LAB — GLUCOSE, CAPILLARY
Glucose-Capillary: 106 mg/dL — ABNORMAL HIGH (ref 70–99)
Glucose-Capillary: 150 mg/dL — ABNORMAL HIGH (ref 70–99)

## 2011-02-25 LAB — GLUCOSE, CAPILLARY: Glucose-Capillary: 130 mg/dL — ABNORMAL HIGH (ref 70–99)

## 2011-02-26 LAB — GLUCOSE, CAPILLARY: Glucose-Capillary: 115 mg/dL — ABNORMAL HIGH (ref 70–99)

## 2011-02-26 NOTE — Consult Note (Signed)
NAMEVASHON, [name]            ACCOUNT NO.:  [id number]  MEDICAL RECORD NO.:  [id number]           PATIENT TYPE:  I  LOCATION:  2912                         FACILITY:  [facility]  PHYSICIAN:  Joycelyn Schmid, MD   DATE OF BIRTH:  [birth year]  DATE OF CONSULTATION:  01/29/2011 DATE OF DISCHARGE:                                CONSULTATION   REASON FOR CONSULTATION:  Facial droop.  HISTORY OF PRESENT ILLNESS:  The patient is a 65 year old male who was undergoing angiography and developed facial droop on the left side upon waking up.  He was last known to be completely normal before the procedure at 12:05 p.m.  The patient, at the time of reversible sedation, started having some difficulty with left-sided facial weakness.  The patient does not report any such instances in the past. The patient reports being normal before undergoing the procedure.  The patient does not recall any such instances of TIA or stroke in the past. The patient does not report any vision problem.  The patient does not report any nausea, vomiting, diarrhea, constipation, fever, headache, or vision loss at this time.  PAST MEDICAL HISTORY: 1. Hypertension. 2. Diabetes type 2. 3. High cholesterol. 4. Myocardial infarction 16 years ago. 5. Coronary artery disease. 6. Peripheral arterial disease. 7. Status post left femoral-popliteal bypass. 8. Post-endarterectomy of left common femoral, profunda femoris     arteries. 9. Echo, May 2011, with EF of 55-60%.  SOCIAL HISTORY:  Not known smoker or alcoholic.  Lives with family.  No report of illicit drug use.  FAMILY HISTORY:  Hypertension and diabetes in parents, could not provide any more specifics at the time of interview due to sedation.  REVIEW OF SYMPTOMS:  As per HPI.  All 10 systems were interviewed and were negative.  PHYSICAL EXAMINATION:  VITAL SIGNS:  Blood pressure 141/86, pulse of 62 sinus rhythm, respiratory rate of 18, 99% oxygen saturation  on 2 liters, temperature is 96. NEUROLOGIC:  Mental status, alert and oriented x3.  Carries out 2-step commands.  Cranial nerves, eyes, PERRLA, midline, conjugate gaze deficiency on the left eye towards the medial side.  Lateral conjugate gaze is intact on both eyes.  There is no ptosis.  There is no visual defect.  There is no nystagmus.  Face is symmetrical with minimal change on the left side which is more prominent with the clenching of teeth as well as broad smile.  Tongue is protruding midline as well as uvula is midline.  There is no dysarthria, aphasia, or slurred speech.  There is no sensation deficit on V1 to V3.  Shoulder shrug and head turns are normal.  Coordination exam, finger-to-nose as well as heel-to-shin exam are normal, however, on finger-to-nose exam, there is some past-pointing which likely is not from ataxia but from the visual difficulty.  Gait was not assessed.  Motor exam, there is a 5/5 power in all extremities and deep tendon reflexes are normal in all extremities.  There is no pronator drift. CORONARY:  Clear to auscultation bilaterally.  No wheezing, no rhonchi. CARDIOVASCULAR:  S1 and S2 are normal.  Regular rate and rhythm.  No murmurs.  There is no bruit in the neck.  Sensations are normal.  LABS:  Sodium 138, potassium 4.3, chloride 104, bicarb 25, BUN is 9, creatinine is 1.23, glucose of 103.  Hemoglobin of 14.2, white count of 5.6, platelets is 236.  Triglycerides are 85, cholesterol is 138, HDL is 35, LDL is 86.  IMAGING STUDIES:  CT head is negative for hemorrhagic stroke.  MRI is suggestive for further investigation, ischemic stroke is suspected.  ASSESSMENT AND PLAN: 1. Left-sided facial droop.  The patient has developed left-sided     facial droop with left conjugate deficiency on the left eyeball to     the medial side which is likely secondary to small vessel disease.     The patient has severe left anterior descending coronary artery      block as further angiogram that was done at this time.  The     patient, at this time, likely will be most benefited with aspirin     and Plavix agents.  We also advised that other risk factors     including diabetes, hypertension, and hypercholesteremia will be     aggressively managed.  The patient should get carotid Dopplers for     further workup for possible carotid artery stenosis.     Clerance Lav, MD PhD   I examined and evaluated patient and agree with plan above.  ______________________________ Joycelyn Schmid, MD    RS/MEDQ  D:  01/29/2011  T:  01/30/2011  Job:  [id number]  Electronically Signed by Clerance Lav MD PHD on 02/26/2011 10:58:05 AM Electronically Signed by Joycelyn Schmid  on 02/26/2011 02:11:11 PM

## 2011-02-27 LAB — GLUCOSE, CAPILLARY
Glucose-Capillary: 115 mg/dL — ABNORMAL HIGH (ref 70–99)
Glucose-Capillary: 107 mg/dL — ABNORMAL HIGH (ref 70–99)
Glucose-Capillary: 142 mg/dL — ABNORMAL HIGH (ref 70–99)

## 2011-02-28 LAB — GLUCOSE, CAPILLARY

## 2011-03-01 LAB — GLUCOSE, CAPILLARY
Glucose-Capillary: 95 mg/dL (ref 70–99)
Glucose-Capillary: 77 mg/dL (ref 70–99)

## 2011-03-01 NOTE — Consult Note (Signed)
  NAME:  [name], [name]            ACCOUNT NO.:  [id number]  MEDICAL RECORD NO.:  [id number]           PATIENT TYPE:  I  LOCATION:  3005                         FACILITY:  [facility]  PHYSICIAN:  Debbera Wolken C. Madilyn Fireman, M.D.    DATE OF BIRTH:  12-27-1945  DATE OF CONSULTATION:  02/03/2011 DATE OF DISCHARGE:                                CONSULTATION   REASON FOR CONSULTATION:  PEG placement.  HISTORY OF PRESENT ILLNESS:  The patient is a 65 year old white male who underwent cardiac catheterization, found to have facial droop and subsequent hemiparesis and dysphagia, was found to have a CVA, defined as a large distal basilar artery thrombus.  He has been on tube feeds and has had a high risk of aspiration on previous modified barium swallow.  Another modified barium swallow was ordered for 2 days from now.  We requested to consider PEG.  The patient is on aspirin and Plavix since the event. He was fed some applesauce yesterday according to his wife which he seemed to tolerate.  PAST MEDICAL HISTORY: 1. Hypertension. 2. Diabetes. 3. Coronary artery disease. 4. Peripheral artery disease, status post femoral-popliteal bypass. 5. History of left femoral endarterectomy.  SOCIAL HISTORY:  Denies alcohol or tobacco use.  FAMILY HISTORY:  Noncontributory.  PHYSICAL EXAM:  GENERAL:  Obese, black male, in no acute distress. Tends to speak with some facial asymmetry and some slurred speech. ABDOMEN:  Soft, distended, with normoactive bowel sounds.  No hepatosplenomegaly, mass, or guarding noted.  No abdominal surgical scars, except 2 small punctate scars left over from previous CABG.  LABS:  Hemoglobin 15, platelets 235, WBC 7900.  PT 13.8, INR 1.04, PTT 29.  IMPRESSION:  Status post cerebrovascular accident with persistent deficits precluding adequate p.o. intake at present, consideration being given for a PEG placement.  PLAN:  Before proceeding with PEG, we will need to discuss with  partners who will be doing the procedure, comfort level of proceeding with the patient on Plavix and whether there is an alternative reversible IV anticoagulant that could be substituted during the periprocedural.  We will also defer until results of modified barium swallow are to see whether PEG could clearly will be warranted.          ______________________________ Everardo All. Madilyn Fireman, M.D.     JCH/MEDQ  D:  02/03/2011  T:  02/04/2011  Job:  [id number]  Electronically Signed by Dorena Cookey M.D. on 02/27/2011 07:10:05 PM

## 2011-03-02 DIAGNOSIS — I69921 Dysphasia following unspecified cerebrovascular disease: Secondary | ICD-10-CM

## 2011-03-02 LAB — GLUCOSE, CAPILLARY
Glucose-Capillary: 94 mg/dL (ref 70–99)
Glucose-Capillary: 91 mg/dL (ref 70–99)
Glucose-Capillary: 117 mg/dL — ABNORMAL HIGH (ref 70–99)

## 2011-03-02 NOTE — Discharge Summary (Signed)
NAME:  [name], FARABEE NO.:  [id number]  MEDICAL RECORD NO.:  [id number]           PATIENT TYPE:  I  LOCATION:  3005                         FACILITY:  [facility]  PHYSICIAN:  Lyn Records, M.D.   DATE OF BIRTH:  [birth year]  DATE OF ADMISSION:  01/28/2011 DATE OF DISCHARGE:  02/08/2011                              DISCHARGE SUMMARY   REASON FOR ADMISSION TO THE HOSPITAL:  Recurrent angina in a patient who is 8 months status post coronary artery bypass surgery.  DISCHARGE DIAGNOSES: 1. Coronary atherosclerotic heart disease with documented bypass graft     occlusion including atresia/occlusion of the left internal mammary     artery graft to the left anterior descending, total occlusion of     the saphenous vein graft to the diagonal, and 70% stenosis of the     saphenous vein graft to the right coronary.     a.     Drug-eluting stent implantation and percutaneous      transluminal coronary angioplasty of left anterior      descending/diagonal on January 29, 2011.  This lesion was felt to      be the culprit for the patient's presentation with recurrent      angina. 2. Posterior circulation embolic cerebrovascular accident with     devastating deficits complicating catheterization performed on     January 29, 2011.     a.     Right hemiplegia plus other neurological deficits. 3. Hypertension. 4. Diabetes mellitus. 5. Hyperlipidemia. 6. Peripheral vascular disease.     a.     Prior history of left femoral artery and profunda      endarterectomy.     b.     Atherosclerosis of the vertebrobasilar system documented by      MRI and selective cerebral angiography during hospital stay.  PROCEDURES PERFORMED: 1. Cardiac catheterization with percutaneous coronary intervention on     January 29, 2011. 2. Head CT at 2:23 p.m. on January 29, 2011. 3. Repeat head CT on January 29, 2011, at 9:30 p.m. 4. A 2-D echocardiogram on January 30, 2011. 5. MRI/MRA of brain on  January 31, 2011. 6. Carotid and vertebral angiography on February 01, 2011. 7. Panda tube insertion on January 30, 2011.  CONSULTATIONS: 1. Neurology consultation on January 29, 2011, immediately post     cardiac catheterization/stent procedure. 2. Rehab Medicine consultation on February 01, 2011. 3. Neuro radiology consultation on January 31, 2011. 4. GI consultation on February 03, 2011.  DISCHARGE PLANS: 1. The patient will be discharged to the [facility]. 2. Medications at discharge include clopidogrel 75 mg per day for 1     year, aspirin 325 mg per day continuously, atenolol 50 mg per day,     glipizide 2.5 mg every morning, atorvastatin 20 mg daily,     nitroglycerin 0.4 mg sublingually p.r.n. chest pain, Docusate 100     mg daily. 3. Aspirin and Plavix dual antiplatelet therapy should be continued     for 1 year because of drug-eluting stent placed in the LAD. 4. Neurology will follow up with the patient  in 2-3 months post     discharge from the hospital. 5. Cardiology followup in 10-14 days post discharge from [facility]     Inpatient Rehab. 6. Family support as much as possible.  HISTORY AND PHYSICAL AND HOSPITAL COURSE:  Please see the admitting history and physical.  In brief summary, the patient is 25 and had undergone coronary artery bypass grafting in May 2011.  On the day of admission to the hospital, he experienced severe recurring angina.  He was hospitalized and placed on IV heparin, aspirin, beta-blocker therapy, and statin therapy.  The following morning, the patient's symptoms had resolved, although the patient's story was compatible with recurrent angina.  Markers were negative.  After discussing the potential complications of recurrent symptoms, coronary angiography was discussed as a means of identifying the problem.  After some discussion, the patient agreed to allow coronary angiography to define anatomy.  This was performed later in the day  on January 29, 2011.  This procedure identified bypass graft occlusion with total occlusion of the LIMA to LAD, total occlusion of the saphenous vein graft to the diagonal, and 70% stenosis of the saphenous vein graft to the right coronary.  The saphenous vein graft to the circumflex was widely patent.  Native vessel disease was severe with 80% eccentric proximal LAD 90 plus percent stenosis in the first diagonal, 90% distal LAD, 99% circumflex stenosis, and 99% mid RCA with 50% distal left main. After reviewing the anatomy, the patient underwent PCI with drug-eluting stent implantation in the LAD and cutting balloon angioplasty of the first diagonal with a good angiographic result.  The procedure was complicated from the very beginning by restlessness.  This occurred nearly immediately upon given the patient Versed for sedation during the diagnostic procedure.  Please see the cardiac catheterization report for details.  Immediately upon arriving in the holding area, the staff became concerned that the patient had neurological findings.  He was able to move all extremities and was arousable.  He was able to participate in conversation.  There appeared to be a right facial droop and some difficulty with left eye movement.  After given Narcan and Romazicon, a code stroke was called when his neurological status seemed unchanged, and we were certain that the sedation had no role in his behavior.  He was seen immediately by Neurology.  Code stroke protocol was followed.  Head CT scan was performed that was unremarkable.  Aspirin and Plavix were continued.  It was felt that he had had an embolic stroke related to the cath.  Over the next 6-7 hours, the patient's clinical status continued to worsen.  There was waxing and waning neurological abnormalities.  This included slurring of speech intermittently and some difficulty with moving his right arm.  Neurology was consulted again concerning  this and a repeat CT scan was performed at around 9:30 p.m.  Again, this study was without abnormality or any evidence of bleeding.  By the next morning, the patient had developed complete hemiplegia of his right side.  He continued to be followed by Neurology.  Multiple testing was then performed including an MRI/MRA, which demonstrated severe stenosis in the superior portion of the basal artery and also evidence of vertebral obstruction bilaterally up to 70%.  The patient ultimately underwent cerebral angiography by Dr. Kerby Nora.  This was very revealing, demonstrating evidence of a filling defect in the superior portion of the basal artery.  An MRI earlier had demonstrated multiple infarcts in the brainstem  and cerebellum.  The thought was that the patient had an embolic CVA at the time of catheterization.  The progressive course was unusual.  The source of the embolus could include catheter-based, arterial embolic debris from instrumentation of the left subclavian doing left internal mammary artery angiography, or cardioembolic from within the cardiac chambers, perhaps induced by the ventriculogram.  The neurological deficits are devastating.  There is complete hemiplegia.  For the ensuing 48 hours, the patient was unable to open his eyes.  He was unable to pass a swallow test.  After approximately 6 days, the patient's alertness improved.  He was able to pass a swallow test, and even though Gastroenterology was consulted for PEG tube placement, this was decided against as the patient was able to swallow on his own.  At the time of discharge, he has had no recurrent angina.  He still has a dense right hemiplegia.  He is able to speak and participate in conversation with understanding, although there is still some confusion. He will be transferred to the [facility] for further rehabilitation and occupational therapy.  CONDITION ON DISCHARGE:   Clearly worse than on presentation to the hospital because he has a life-altering brainstem/posterior circulation stroke.  Cardiac status is stable.     Lyn Records, M.D.     HWS/MEDQ  D:  02/07/2011  T:  02/07/2011  Job:  [id number]  cc:   Pramod P. Pearlean Brownie, MD Georgann Housekeeper, MD  Electronically Signed by Verdis Prime M.D. on 03/01/2011 09:22:49 AM

## 2011-03-03 LAB — GLUCOSE, CAPILLARY
Glucose-Capillary: 112 mg/dL — ABNORMAL HIGH (ref 70–99)
Glucose-Capillary: 107 mg/dL — ABNORMAL HIGH (ref 70–99)
Glucose-Capillary: 117 mg/dL — ABNORMAL HIGH (ref 70–99)

## 2011-03-04 LAB — GLUCOSE, CAPILLARY
Glucose-Capillary: 92 mg/dL (ref 70–99)
Glucose-Capillary: 103 mg/dL — ABNORMAL HIGH (ref 70–99)

## 2011-03-05 LAB — GLUCOSE, CAPILLARY
Glucose-Capillary: 98 mg/dL (ref 70–99)
Glucose-Capillary: 90 mg/dL (ref 70–99)
Glucose-Capillary: 103 mg/dL — ABNORMAL HIGH (ref 70–99)

## 2011-03-06 DIAGNOSIS — G8100 Flaccid hemiplegia affecting unspecified side: Secondary | ICD-10-CM

## 2011-03-06 DIAGNOSIS — I6330 Cerebral infarction due to thrombosis of unspecified cerebral artery: Secondary | ICD-10-CM

## 2011-03-06 DIAGNOSIS — I69921 Dysphasia following unspecified cerebrovascular disease: Secondary | ICD-10-CM

## 2011-03-06 LAB — GLUCOSE, CAPILLARY
Glucose-Capillary: 97 mg/dL (ref 70–99)
Glucose-Capillary: 110 mg/dL — ABNORMAL HIGH (ref 70–99)
Glucose-Capillary: 88 mg/dL (ref 70–99)

## 2011-03-08 LAB — TYPE AND SCREEN: Antibody Screen: NEGATIVE

## 2011-03-08 LAB — BASIC METABOLIC PANEL
CO2: 29 mEq/L (ref 19–32)
CO2: 31 mEq/L (ref 19–32)
Chloride: 101 mEq/L (ref 96–112)
Chloride: 102 mEq/L (ref 96–112)
Creatinine, Ser: 1.27 mg/dL (ref 0.4–1.5)
GFR calc Af Amer: >60 mL/min (ref >60)
GFR calc Af Amer: >60 mL/min (ref >60)
Glucose, Bld: 128 mg/dL — ABNORMAL HIGH (ref 70–99)
Potassium: 4.9 mEq/L (ref 3.5–5.1)
Sodium: 135 mEq/L (ref 135–145)
Sodium: 136 mEq/L (ref 135–145)

## 2011-03-08 LAB — URINALYSIS, MICROSCOPIC ONLY
Bilirubin Urine: NEGATIVE
Glucose, UA: NEGATIVE mg/dL
Ketones, ur: NEGATIVE mg/dL
Leukocytes, UA: NEGATIVE
Nitrite: NEGATIVE
Specific Gravity, Urine: 1.022 (ref 1.005–1.030)
pH: 6 (ref 5.0–8.0)

## 2011-03-08 LAB — COMPREHENSIVE METABOLIC PANEL
AST: 24 U/L (ref 0–37)
BUN: 11 mg/dL (ref 6–23)
CO2: 31 mEq/L (ref 19–32)
Calcium: 9.6 mg/dL (ref 8.4–10.5)
Chloride: 103 mEq/L (ref 96–112)
Creatinine, Ser: 1.38 mg/dL (ref 0.4–1.5)
GFR calc non Af Amer: 52 mL/min — ABNORMAL LOW (ref >60)
Glucose, Bld: 105 mg/dL — ABNORMAL HIGH (ref 70–99)
Total Bilirubin: 0.9 mg/dL (ref 0.3–1.2)

## 2011-03-08 LAB — GLUCOSE, CAPILLARY
Glucose-Capillary: 121 mg/dL — ABNORMAL HIGH (ref 70–99)
Glucose-Capillary: 99 mg/dL (ref 70–99)

## 2011-03-08 LAB — CBC
HCT: 40.4 % (ref 39.0–52.0)
Hemoglobin: 13.1 g/dL (ref 13.0–17.0)
Hemoglobin: 13.2 g/dL (ref 13.0–17.0)
Hemoglobin: 15.9 g/dL (ref 13.0–17.0)
MCH: 29.9 pg (ref 26.0–34.0)
MCH: 30.1 pg (ref 26.0–34.0)
MCH: 30.8 pg (ref 26.0–34.0)
MCHC: 32.7 g/dL (ref 30.0–36.0)
MCHC: 33.8 g/dL (ref 30.0–36.0)
MCV: 90.2 fL (ref 78.0–100.0)
MCV: 92 fL (ref 78.0–100.0)
MCV: 91.1 fL (ref 78.0–100.0)
Platelets: 181 10*3/uL (ref 150–400)
RBC: 4.38 MIL/uL (ref 4.22–5.81)
RBC: 4.39 MIL/uL (ref 4.22–5.81)
RBC: 5.17 MIL/uL (ref 4.22–5.81)
WBC: 10.4 10*3/uL (ref 4.0–10.5)

## 2011-03-08 LAB — APTT: aPTT: 29 seconds (ref 24–37)

## 2011-03-08 LAB — PROTIME-INR
INR: 1.01 (ref 0.00–1.49)
Prothrombin Time: 13.5 seconds (ref 11.6–15.2)

## 2011-03-08 LAB — URIC ACID: Uric Acid, Serum: 7.2 mg/dL (ref 4.0–7.8)

## 2011-03-08 LAB — URINALYSIS, ROUTINE W REFLEX MICROSCOPIC
Bilirubin Urine: NEGATIVE
Ketones, ur: NEGATIVE mg/dL
Nitrite: NEGATIVE
Protein, ur: NEGATIVE mg/dL
pH: 5.5 (ref 5.0–8.0)

## 2011-03-08 LAB — POTASSIUM: Potassium: 4.9 mEq/L (ref 3.5–5.1)

## 2011-03-13 LAB — GLUCOSE, CAPILLARY
Glucose-Capillary: 100 mg/dL — ABNORMAL HIGH (ref 70–99)
Glucose-Capillary: 104 mg/dL — ABNORMAL HIGH (ref 70–99)
Glucose-Capillary: 117 mg/dL — ABNORMAL HIGH (ref 70–99)
Glucose-Capillary: 118 mg/dL — ABNORMAL HIGH (ref 70–99)
Glucose-Capillary: 143 mg/dL — ABNORMAL HIGH (ref 70–99)
Glucose-Capillary: 147 mg/dL — ABNORMAL HIGH (ref 70–99)
Glucose-Capillary: 168 mg/dL — ABNORMAL HIGH (ref 70–99)
Glucose-Capillary: 203 mg/dL — ABNORMAL HIGH (ref 70–99)

## 2011-03-13 LAB — CBC
HCT: 30.9 % — ABNORMAL LOW (ref 39.0–52.0)
HCT: 39.1 % (ref 39.0–52.0)
HCT: 39.8 % (ref 39.0–52.0)
Hemoglobin: 10.5 g/dL — ABNORMAL LOW (ref 13.0–17.0)
Hemoglobin: 11.6 g/dL — ABNORMAL LOW (ref 13.0–17.0)
Hemoglobin: 12.8 g/dL — ABNORMAL LOW (ref 13.0–17.0)
Hemoglobin: 13.1 g/dL (ref 13.0–17.0)
Hemoglobin: 13.6 g/dL (ref 13.0–17.0)
Hemoglobin: 14.6 g/dL (ref 13.0–17.0)
MCHC: 33.6 g/dL (ref 30.0–36.0)
MCHC: 33.7 g/dL (ref 30.0–36.0)
MCHC: 33.9 g/dL (ref 30.0–36.0)
MCHC: 34 g/dL (ref 30.0–36.0)
MCHC: 34 g/dL (ref 30.0–36.0)
MCHC: 34 g/dL (ref 30.0–36.0)
MCHC: 34.1 g/dL (ref 30.0–36.0)
MCV: 92.9 fL (ref 78.0–100.0)
MCV: 93.4 fL (ref 78.0–100.0)
MCV: 93.8 fL (ref 78.0–100.0)
MCV: 93.8 fL (ref 78.0–100.0)
MCV: 93.9 fL (ref 78.0–100.0)
MCV: 94.1 fL (ref 78.0–100.0)
Platelets: 109 10*3/uL — ABNORMAL LOW (ref 150–400)
Platelets: 136 10*3/uL — ABNORMAL LOW (ref 150–400)
Platelets: 140 10*3/uL — ABNORMAL LOW (ref 150–400)
Platelets: 193 10*3/uL (ref 150–400)
Platelets: 193 10*3/uL (ref 150–400)
RBC: 4.01 MIL/uL — ABNORMAL LOW (ref 4.22–5.81)
RBC: 4.19 MIL/uL — ABNORMAL LOW (ref 4.22–5.81)
RBC: 4.24 MIL/uL (ref 4.22–5.81)
RBC: 4.6 MIL/uL (ref 4.22–5.81)
RDW: 13.8 % (ref 11.5–15.5)
RDW: 13.8 % (ref 11.5–15.5)
RDW: 14 % (ref 11.5–15.5)
RDW: 14.2 % (ref 11.5–15.5)
WBC: 14.1 10*3/uL — ABNORMAL HIGH (ref 4.0–10.5)
WBC: 17.8 10*3/uL — ABNORMAL HIGH (ref 4.0–10.5)
WBC: 4.8 10*3/uL (ref 4.0–10.5)
WBC: 6.3 10*3/uL (ref 4.0–10.5)

## 2011-03-13 LAB — BASIC METABOLIC PANEL
BUN: 11 mg/dL (ref 6–23)
BUN: 15 mg/dL (ref 6–23)
CO2: 23 mEq/L (ref 19–32)
CO2: 27 mEq/L (ref 19–32)
CO2: 27 mEq/L (ref 19–32)
CO2: 31 mEq/L (ref 19–32)
Calcium: 8 mg/dL — ABNORMAL LOW (ref 8.4–10.5)
Calcium: 8.3 mg/dL — ABNORMAL LOW (ref 8.4–10.5)
Chloride: 102 mEq/L (ref 96–112)
Chloride: 107 mEq/L (ref 96–112)
Chloride: 108 mEq/L (ref 96–112)
Creatinine, Ser: 1.17 mg/dL (ref 0.4–1.5)
Creatinine, Ser: 1.34 mg/dL (ref 0.4–1.5)
Creatinine, Ser: 1.48 mg/dL (ref 0.4–1.5)
GFR calc Af Amer: >60 mL/min (ref >60)
GFR calc Af Amer: >60 mL/min (ref >60)
GFR calc non Af Amer: 56 mL/min — ABNORMAL LOW (ref >60)
GFR calc non Af Amer: >60 mL/min (ref >60)
Glucose, Bld: 147 mg/dL — ABNORMAL HIGH (ref 70–99)
Glucose, Bld: 96 mg/dL (ref 70–99)
Glucose, Bld: 97 mg/dL (ref 70–99)
Potassium: 4.2 mEq/L (ref 3.5–5.1)
Potassium: 4.2 mEq/L (ref 3.5–5.1)
Sodium: 138 mEq/L (ref 135–145)
Sodium: 138 mEq/L (ref 135–145)

## 2011-03-13 LAB — COMPREHENSIVE METABOLIC PANEL
ALT: 20 U/L (ref 0–53)
ALT: 26 U/L (ref 0–53)
AST: 22 U/L (ref 0–37)
Albumin: 3 g/dL — ABNORMAL LOW (ref 3.5–5.2)
Alkaline Phosphatase: 59 U/L (ref 39–117)
Alkaline Phosphatase: 69 U/L (ref 39–117)
CO2: 27 mEq/L (ref 19–32)
GFR calc Af Amer: >60 mL/min (ref >60)
GFR calc non Af Amer: 52 mL/min — ABNORMAL LOW (ref >60)
Glucose, Bld: 117 mg/dL — ABNORMAL HIGH (ref 70–99)
Potassium: 4 mEq/L (ref 3.5–5.1)
Potassium: 4 mEq/L (ref 3.5–5.1)
Sodium: 137 mEq/L (ref 135–145)
Sodium: 141 mEq/L (ref 135–145)
Total Protein: 6.3 g/dL (ref 6.0–8.3)

## 2011-03-13 LAB — POCT I-STAT 4, (NA,K, GLUC, HGB,HCT)
Glucose, Bld: 96 mg/dL (ref 70–99)
Glucose, Bld: 99 mg/dL (ref 70–99)
HCT: 28 % — ABNORMAL LOW (ref 39.0–52.0)
HCT: 31 % — ABNORMAL LOW (ref 39.0–52.0)
HCT: 39 % (ref 39.0–52.0)
Hemoglobin: 10.5 g/dL — ABNORMAL LOW (ref 13.0–17.0)
Hemoglobin: 13.3 g/dL (ref 13.0–17.0)
Hemoglobin: 9.5 g/dL — ABNORMAL LOW (ref 13.0–17.0)
Potassium: 4.3 mEq/L (ref 3.5–5.1)
Potassium: 4.3 mEq/L (ref 3.5–5.1)
Potassium: 4.9 mEq/L (ref 3.5–5.1)
Sodium: 132 mEq/L — ABNORMAL LOW (ref 135–145)
Sodium: 134 mEq/L — ABNORMAL LOW (ref 135–145)
Sodium: 136 mEq/L (ref 135–145)

## 2011-03-13 LAB — BLOOD GAS, ARTERIAL
Acid-Base Excess: 2.8 mmol/L — ABNORMAL HIGH (ref 0.0–2.0)
O2 Saturation: 94.4 %
Patient temperature: 98.7
TCO2: 29.2 mmol/L (ref 0–100)

## 2011-03-13 LAB — HEMOGLOBIN AND HEMATOCRIT, BLOOD
HCT: 28.9 % — ABNORMAL LOW (ref 39.0–52.0)
Hemoglobin: 9.8 g/dL — ABNORMAL LOW (ref 13.0–17.0)

## 2011-03-13 LAB — DIFFERENTIAL
Basophils Absolute: 0 10*3/uL (ref 0.0–0.1)
Basophils Relative: 1 % (ref 0–1)
Eosinophils Absolute: 0.2 10*3/uL (ref 0.0–0.7)
Eosinophils Relative: 3 % (ref 0–5)
Lymphocytes Relative: 32 % (ref 12–46)
Lymphs Abs: 1.7 10*3/uL (ref 0.7–4.0)
Monocytes Absolute: 0.6 10*3/uL (ref 0.1–1.0)
Monocytes Relative: 10 % (ref 3–12)
Neutro Abs: 2.9 10*3/uL (ref 1.7–7.7)
Neutrophils Relative %: 54 % (ref 43–77)

## 2011-03-13 LAB — URINALYSIS, ROUTINE W REFLEX MICROSCOPIC
Bilirubin Urine: NEGATIVE
Ketones, ur: NEGATIVE mg/dL
Nitrite: NEGATIVE
Protein, ur: NEGATIVE mg/dL
Specific Gravity, Urine: 1.011 (ref 1.005–1.030)
Urobilinogen, UA: 0.2 mg/dL (ref 0.0–1.0)

## 2011-03-13 LAB — APTT: aPTT: 33 seconds (ref 24–37)

## 2011-03-13 LAB — POCT I-STAT 3, ART BLOOD GAS (G3+)
Acid-base deficit: 2 mmol/L (ref 0.0–2.0)
Acid-base deficit: 3 mmol/L — ABNORMAL HIGH (ref 0.0–2.0)
Bicarbonate: 22.5 mEq/L (ref 20.0–24.0)
Bicarbonate: 26.8 mEq/L — ABNORMAL HIGH (ref 20.0–24.0)
O2 Saturation: 93 %
Patient temperature: 35.3
pCO2 arterial: 43.2 mmHg (ref 35.0–45.0)
pH, Arterial: 7.327 — ABNORMAL LOW (ref 7.350–7.450)
pH, Arterial: 7.408 (ref 7.350–7.450)
pO2, Arterial: 272 mmHg — ABNORMAL HIGH (ref 80.0–100.0)
pO2, Arterial: 65 mmHg — ABNORMAL LOW (ref 80.0–100.0)
pO2, Arterial: 68 mmHg — ABNORMAL LOW (ref 80.0–100.0)

## 2011-03-13 LAB — PROTIME-INR
INR: 1.01 (ref 0.00–1.49)
INR: 1.13 (ref 0.00–1.49)
INR: 1.52 — ABNORMAL HIGH (ref 0.00–1.49)
Prothrombin Time: 13.2 seconds (ref 11.6–15.2)
Prothrombin Time: 14.4 seconds (ref 11.6–15.2)
Prothrombin Time: 18.2 seconds — ABNORMAL HIGH (ref 11.6–15.2)

## 2011-03-13 LAB — CARDIAC PANEL(CRET KIN+CKTOT+MB+TROPI)
CK, MB: 2.1 ng/mL (ref 0.3–4.0)
CK, MB: 4.7 ng/mL — ABNORMAL HIGH (ref 0.3–4.0)
Relative Index: 1.4 (ref 0.0–2.5)
Relative Index: 2.4 (ref 0.0–2.5)
Total CK: 196 U/L (ref 7–232)

## 2011-03-13 LAB — POCT I-STAT, CHEM 8
BUN: 18 mg/dL (ref 6–23)
Calcium, Ion: 1.18 mmol/L (ref 1.12–1.32)
Creatinine, Ser: 1.7 mg/dL — ABNORMAL HIGH (ref 0.4–1.5)
TCO2: 26 mmol/L (ref 0–100)

## 2011-03-13 LAB — TYPE AND SCREEN
ABO/RH(D): O POS
Antibody Screen: NEGATIVE

## 2011-03-13 LAB — HEPARIN LEVEL (UNFRACTIONATED)
Heparin Unfractionated: 0.29 IU/mL — ABNORMAL LOW (ref 0.30–0.70)
Heparin Unfractionated: 0.38 IU/mL (ref 0.30–0.70)

## 2011-03-13 LAB — MRSA PCR SCREENING: MRSA by PCR: NEGATIVE

## 2011-03-13 LAB — BRAIN NATRIURETIC PEPTIDE: Pro B Natriuretic peptide (BNP): <30 pg/mL (ref 0.0–100.0)

## 2011-03-13 LAB — POCT I-STAT GLUCOSE: Glucose, Bld: 93 mg/dL (ref 70–99)

## 2011-03-13 LAB — LIPID PANEL
HDL: 31 mg/dL — ABNORMAL LOW (ref >39)
Triglycerides: 71 mg/dL (ref <150)
VLDL: 14 mg/dL (ref 0–40)

## 2011-03-13 LAB — MAGNESIUM: Magnesium: 2.2 mg/dL (ref 1.5–2.5)

## 2011-03-13 LAB — CK TOTAL AND CKMB (NOT AT ARMC): Total CK: 232 U/L (ref 7–232)

## 2011-03-13 LAB — ABO/RH: ABO/RH(D): O POS

## 2011-03-13 LAB — HEMOGLOBIN A1C
Hgb A1c MFr Bld: 6.6 % — ABNORMAL HIGH (ref <5.7)
Mean Plasma Glucose: 143 mg/dL — ABNORMAL HIGH (ref <117)

## 2011-03-13 LAB — CREATININE, SERUM: GFR calc Af Amer: 47 mL/min — ABNORMAL LOW (ref >60)

## 2011-03-13 LAB — TROPONIN I: Troponin I: 0.04 ng/mL (ref 0.00–0.06)

## 2011-03-22 NOTE — Discharge Summary (Signed)
NAME:  [name], [name]            ACCOUNT NO.:  [id number]  MEDICAL RECORD NO.:  [id number]           PATIENT TYPE:  I  LOCATION:  4039                         FACILITY:  [facility]  PHYSICIAN:  Erick Colace, M.D.DATE OF BIRTH:  July 15, 1946  DATE OF ADMISSION:  02/07/2011 DATE OF DISCHARGE:  03/07/2011                              DISCHARGE SUMMARY   DISCHARGE DIAGNOSES: 1. Embolic posterior cerebral artery PCA infarct with right     hemiparesis, right facial weakness, dysphagia, dysarthria, left     gaze preference and diplopia and cognitive deficits. 2. Diabetes mellitus, type 2. 3. Coronary artery disease status post stenting. 4. Obesity. 5. Peripheral edema, resolved. 6. Gout flare. 7. Diabetes mellitus, type 2.  HISTORY OF PRESENT ILLNESS:  [name] is a 65 year old male with history of coronary artery disease with CABG in May 2011, peripheral vascular disease, admitted on January 29, 2011, with chest pain, question unstable angina.  He underwent cardiac cath revealing LIMA occlusion and underwent PCI of his stent by Dr. Katrinka Blazing.  Post procedure, the patient with right facial weakness and slurred speech.  Code stroke was initiated, and MRA of brain done showed acute infarcts throughout left PCA and small infarct in left cerebellum, bilateral thalamic and right occipital lobe.  Carotid Dopplers done showed no ICA stenosis. MRI brain showed severe basilar artery stenosis, and cerebral angio showed large basilar artery clot, question source.  The patient is on aspirin, Plavix for posterior circulation infarct due to basilar occlusion per Neurology input.  Currently, the patient has been limited by right toe pain due to gout flare.  He has been treated with IV Toradol.  The patient also with dysphagia and recent MBS done showed reduced mastication with decreased sensation.  Currently, the patient is on D2 diet nectar liquids due to fatigue factor.  He currently  continues with left gaze preference, right facial weakness, and dense right hemiparesis.  PAST MEDICAL HISTORY:  Significant for: 1. Coronary artery disease with CABG. 2. Dyslipidemia. 3. Obesity. 4. Colon polyps. 5. Left lower extremity claudication with left common femoral vein and     profunda femoral artery. 6. Above knee bypass graft. 7. Diverticulosis. 8. Chronic lower extremity edema. 9. Gout. 10.Recent diagnosis of diabetes.  ALLERGIES:  No known drug allergies.  FAMILY HISTORY:  Positive for diabetes mellitus and coronary artery disease.  SOCIAL HISTORY:  The patient is married, lives in two-level home with three steps at entry, bedroom on first level.  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  Quit tobacco 15 years ago.  Does not use any alcohol.  FUNCTIONAL HISTORY:  The patient was independent and working prior to admission.  FUNCTIONAL STATUS:  The patient is currently working on visual tracking and finding as well as holding head in midline.  He is able to sit at edge of bed with min to total assist for 25 minutes.  He is +3 total assist 40% for transfer with bilateral lower extremity block.  PHYSICAL EXAMINATION:  VITAL SIGNS:  Blood pressure 141/90, pulse 60, respirations 18, temperature 97.7. GENERAL:  The patient is an obese male with right facial weakness and  left ptosis, in no acute distress. HEENT:  Eyes anicteric and noninjected.  He has mild ptosis on the left. Oral mucosa with some thrush on tongue, otherwise moist. NECK:  Supple without JVD or lymphadenopathy. LUNGS:  Good respiratory effort and are clear to auscultation bilaterally. HEART:  Regular rate and rhythm without murmurs or gallops. ABDOMEN:  Soft, nontender with positive bowel sounds. EXTREMITIES:  Positive posterior tibial pulses, 1+ pretibial edema bilaterally. NEUROLOGIC:  The patient is oriented to person and place.  He was noted to have decreased memory, flat affect, and appears somewhat  somnolent but easily arousable.  Sensation reduced on right side.  Left motor strength is 0/5 right deltoid, biceps, triceps, finger flexors, hip flexures, quad, TA, and gastroc.  He does have some flick of abductor on right side but this is not reproducible.  Left side strength is 4/5 in left upper and left lower extremity.  HOSPITAL COURSE:  [name] was admitted to rehab on February 07, 2011, for inpatient therapies to consist of PT, OT, and speech therapy at least 3 hours 5 days a week.  Past admission physiatrist rehab RN and therapy team have worked together to provide customized collaborative interdisciplinary care.  The patient's blood pressures were checked on b.i.d. basis during this stay and these have shown good control ranging from 110s to 130s systolic, 70s to 91Y diastolic, heart rate has been stable in the 60s range.  His p.o. intake was monitored on his dysphagia diet nectar liquids.  Wife has been very supportive and has helped pushing fluids to maintain hydration on nectar liquids. Routine check of labs were done during this stay with close monitoring of renal status.  The patient was advanced to regular diet, D3 diet thin liquids, by the time of discharge and currently is tolerating this without difficulty.  Most recent electrolytes of February 13, 2011, revealed sodium 136, potassium 4.7, chloride 104, CO2 of 25, BUN 21, creatinine 1.36, glucose 104.  CBC last of February 08, 2011, reveals hemoglobin 15.0, hematocrit 43.7, white count 5.8, platelets 322.  A UA/UC was done past admission, and the patient was noted to have Serratia UTI.  This was treated with Cipro.  DISCHARGE SUMMARY ADDENDUM TO FOLLOW.     Delle Reining, P.A.   ______________________________ Erick Colace, M.D.    PL/MEDQ  D:  03/07/2011  T:  03/08/2011  Job:  [id number]  cc:   Georgann Housekeeper, MD Lyn Records, M.D. Pramod P. Pearlean Brownie, MD  Electronically Signed by Osvaldo Shipper. on 03/12/2011 08:28:15 AM Electronically Signed by Claudette Laws M.D. on 03/22/2011 04:38:58 PM

## 2011-03-22 NOTE — Discharge Summary (Signed)
NAME:  [name], [name]            ACCOUNT NO.:  [id number]  MEDICAL RECORD NO.:  [id number]           PATIENT TYPE:  I  LOCATION:  4039                         FACILITY:  [facility]  PHYSICIAN:  Erick Colace, M.D.DATE OF BIRTH:  [birth year]  DATE OF ADMISSION:  02/07/2011 DATE OF DISCHARGE:  03/07/2011                              DISCHARGE SUMMARY   ADDENDUM TO DISCHARE SUMMARY  DISCHARGE DIAGNOSES: 1. 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 2. Diabetes mellitus type 2. 3. Coronary artery disease status post stents. 4. Obesity. 5. Peripheral neuropathy. 6. Gout flare.  At the time of admission, the patient was noted to have Serratia UTI. He was treated with Cipro x7 days for this.  CBGs were checked on a.c. and h.s. basis during this stay.  Sliding scale insulin was used for elevated blood sugars.  Initially, blood sugars were noted to be high due to thickened liquids most of which were juices.  His glipizide was increased to 2.5 mg b.i.d.  Currently, blood sugars are ranging from 80- 110 range.  The patient and wife have been advised regarding continuing carb-modified diet for now.  Recheck blood sugars on b.i.d. basis and follow up with Dr. Donette Larry for further adjustment in his diabetes regimen.  The patient was noted to have issues with adjustment reaction and some lability during this stay.  Dr. Leonides Cave, Neuropsych, evaluated the patient and has been following along for support.  He felt the patient was unguarded and insightful, but he was experiencing no psychological distress than he reported, especially as a man of his generation who was used to being self-reliant and taking care of his family.  He felt the patient with adjustment reaction with depressed mood.  The patient was not interested in any mood enhancing medication at this  time.  Ego support has been offered throughout his stay by the whole therapy team.  The patient's wife also reported some questionable history of sleep apnea.  O2 was used on p.r.n. basis at bedtime to help with his symptoms.  They are to follow up with primary care regarding setting up a sleep study on outpatient basis.  During the patient's stay in rehab, weekly team conferences were held to monitor the patient's progress, set goals, as well as discuss barriers to discharge.  At admission, the patient was noted to have right hemiparesis with right lean, right inattention, as well as decreased strength and decreased balance overall affecting his mobility.  The patient required +2 sliding board assist for transfers.  He was able to maintain standing balance and +2 total assist 20%.  The patient's wife has been supportive and has been here during most of his therapy sessions.  Currently, the patient has progress to being at min assist for transfers.  He was supervision for wheelchair mobility.  He is total assist +2 25% for ambulating short distances.  The patient is now able to initiate right lower extremity movement, but due to his hemisensory deficits and proprioception deficits, gait goals were not  met.  Family education has been completed with wife regarding min assist, wheelchair transfers, as well as mod assist car transfers.  Further followup home health therapies to continue working on overall mobility deficits.  OT has been ongoing during this stay with neuro reeducation on going for right upper extremity weakness as well as his diplopia.  The patient initially required cues for his right inattention as well as body spatial awareness.  He was at max to total assist for self-care tasks. Currently, the patient is min assist for bathing and dressing.  Wife is to provide assistance with self-care tasks past discharge.  Speech Therapy has been working with the patient on dysphagia  treatment. Currently, the patient has been advanced to D3 diet with thin liquids. He is told provide straws to prevent aspiration episodes.  Speech Therapy has also worked with the patient on improving ability to follow two-step commands as well as worked on sustained attention for functional tasks.  They have also been working on oral motor exercises to help improve speech intelligibility.  Currently, the patient is supervision with basic communication tasks.  He is modified independent for his word-finding deficits.  He is showing increased use of organizational word strategies for his expressive difficulties.  He continues to require mod to max assist for high-level cognitive tasks. Further followup home health speech therapy and occupational therapy to continue past discharge.  On March 07, 2011, the patient is discharged to home.  DISCHARGE MEDICATIONS: 1. Tylenol 325-650 mg p.o. q.4 h. p.r.n. pain. 2. Allopurinol 100 mg p.o. per day. 3. Pepcid 20 mg p.o. b.i.d. 4. Senokot-S one p.o. at bedtime. 5. Atenolol 50 mg one p.o. b.i.d. 6. Glipizide 5 mg one p.o. b.i.d. 7. Coated aspirin 325 mg per day. 8. Plavix 75 mg per day with meals. 9. Lipitor 20 mg p.o. q.p.m. 10.Nitroglycerin 0.4 mg sublingual p.r.n. chest pain.  DIET:  Carb-modified, medium low fat, low salt.  SPECIAL INSTRUCTIONS:  A 24-hour supervision and assistance.  No strenuous activity.  No alcohol, no smoking, no driving.  No straws. Check blood sugars on b.i.d. basis and record.  [facility] to provide PT, OT, Speech Therapy, and RN.  FOLLOWUP:  The patient to follow up with Dr. Wynn Banker on April 10, 2011, at 9:30 to 10 a.m., follow up with Dr. Donette Larry in 2 weeks, follow up with Dr. Verdis Prime in 2-4 weeks, follow up with Dr. Pearlean Brownie in 4 weeks.     Delle Reining, P.A.   ______________________________ Erick Colace, M.D.    PL/MEDQ  D:  03/07/2011  T:  03/08/2011  Job:  [id number]  cc:   Georgann Housekeeper, MD Lyn Records, M.D. Pramod P. Pearlean Brownie, MD  Electronically Signed by Osvaldo Shipper. on 03/12/2011 08:26:09 AM Electronically Signed by Claudette Laws M.D. on 03/22/2011 04:38:50 PM

## 2011-04-02 ENCOUNTER — Ambulatory Visit: Payer: Medicare Other | Attending: Internal Medicine | Admitting: Physical Therapy

## 2011-04-02 DIAGNOSIS — M2420 Disorder of ligament, unspecified site: Secondary | ICD-10-CM | POA: Insufficient documentation

## 2011-04-02 DIAGNOSIS — Z5189 Encounter for other specified aftercare: Secondary | ICD-10-CM | POA: Insufficient documentation

## 2011-04-02 DIAGNOSIS — I69998 Other sequelae following unspecified cerebrovascular disease: Secondary | ICD-10-CM | POA: Insufficient documentation

## 2011-04-02 DIAGNOSIS — R41842 Visuospatial deficit: Secondary | ICD-10-CM | POA: Insufficient documentation

## 2011-04-02 DIAGNOSIS — R269 Unspecified abnormalities of gait and mobility: Secondary | ICD-10-CM | POA: Insufficient documentation

## 2011-04-02 DIAGNOSIS — M629 Disorder of muscle, unspecified: Secondary | ICD-10-CM | POA: Insufficient documentation

## 2011-04-02 DIAGNOSIS — M6281 Muscle weakness (generalized): Secondary | ICD-10-CM | POA: Insufficient documentation

## 2011-04-03 ENCOUNTER — Ambulatory Visit: Payer: Medicare Other | Admitting: Physical Therapy

## 2011-04-03 ENCOUNTER — Ambulatory Visit: Payer: Medicare Other | Admitting: Occupational Therapy

## 2011-04-09 ENCOUNTER — Ambulatory Visit: Payer: Medicare Other | Admitting: Physical Therapy

## 2011-04-10 ENCOUNTER — Inpatient Hospital Stay (HOSPITAL_BASED_OUTPATIENT_CLINIC_OR_DEPARTMENT_OTHER): Payer: Medicare Other | Admitting: Physical Medicine & Rehabilitation

## 2011-04-10 ENCOUNTER — Ambulatory Visit: Payer: Medicare Other | Admitting: Physical Therapy

## 2011-04-10 ENCOUNTER — Encounter: Payer: Medicare Other | Attending: Physical Medicine & Rehabilitation

## 2011-04-10 DIAGNOSIS — I69959 Hemiplegia and hemiparesis following unspecified cerebrovascular disease affecting unspecified side: Secondary | ICD-10-CM | POA: Insufficient documentation

## 2011-04-10 DIAGNOSIS — I2510 Atherosclerotic heart disease of native coronary artery without angina pectoris: Secondary | ICD-10-CM | POA: Insufficient documentation

## 2011-04-10 DIAGNOSIS — Z951 Presence of aortocoronary bypass graft: Secondary | ICD-10-CM | POA: Insufficient documentation

## 2011-04-10 DIAGNOSIS — I739 Peripheral vascular disease, unspecified: Secondary | ICD-10-CM | POA: Insufficient documentation

## 2011-04-10 DIAGNOSIS — G8110 Spastic hemiplegia affecting unspecified side: Secondary | ICD-10-CM

## 2011-04-11 NOTE — Assessment & Plan Note (Signed)
REASON FOR VISIT:  Right-sided weakness.  HISTORY:  A 65 year old male with history of coronary artery disease, status post CABG in May 2011, peripheral vascular disease as well admitted with chest pain on January 29, 2011, underwent cardiac cath revealing coronary artery occlusion, underwent stenting, but had a peri- procedure CVA.  MRI of the brain showed acute infarcts throughout the left PCA and small infarct in the left cerebellum, bilateral thalamic and right occipital lobe as well.  MRA showed severe basilar artery stenosis, large basilar artery clot, placed on Plavix.  He initially had severe dysphagia, but was able to be upgraded to a regular diet without signs of aspiration.  He went through inpatient rehabilitation from February 07, 2011 to March 07, 2011.  He initially received home health therapy, but this was not particularly helpful and they switched rather rapidly to outpatient therapy once the patient was able to get in and out of the car without difficulty.  He has been going to outpatient PT, OT and speech.  He has had no seizures.  No falls.  He does complain of some depression, but this is more or less ups and downs day-to-day rather than a continuous type process.  He has been fitted for a KAFO.  He had blood pressure 118/46, pulse 66, respirations 18 and O2 sat 97% on room air.  General in no acute distress.  Mood and affect appropriate.  His gait was not tested.  Sensation is intact.  Motor strength is 5/5 in the left upper and left lower extremity.  In the right upper extremity, he has 2- at the arm flexors and arm abductor. In the right lower extremity, he has 2- in the hip flexors and has intermittent knee extensor in a synergy pattern.  He does have a right central seven facial droop.  IMPRESSION: 1. Right spastic hemiplegia due to cerebrovascular accident.  His     spasticity is not a huge issue at this point, although he does have     some pronator  spasticity that does inhibit his supination. 2. In terms of his gait, has just been fitted with knee-ankle-foot     orthosis.  PLAN:  I will see him back in 1 month to monitor his therapy progress, assess for need for Botox.  I will ask him to bring in his KAFO to ambulate for me next visit.  He will continue with his current medications.  He will follow up Dr. Pearlean Brownie on April 16, 2011.  He will follow up with primary care as well as Cardiology.     Erick Colace, M.D. Electronically Signed    AEK/MedQ D:  04/10/2011 13:17:07  T:  04/11/2011 00:45:41  Job #:  [id number]  cc:   Pramod P. Pearlean Brownie, MD Fax: [phone]  Lyn Records, M.D. Fax: [phone]

## 2011-04-12 ENCOUNTER — Ambulatory Visit: Payer: Medicare Other | Admitting: Occupational Therapy

## 2011-04-12 ENCOUNTER — Ambulatory Visit: Payer: Medicare Other | Admitting: Physical Therapy

## 2011-04-16 ENCOUNTER — Ambulatory Visit: Payer: Medicare Other | Admitting: Physical Therapy

## 2011-04-16 ENCOUNTER — Ambulatory Visit: Payer: Medicare Other | Admitting: Occupational Therapy

## 2011-04-17 ENCOUNTER — Ambulatory Visit: Payer: Medicare Other | Admitting: Occupational Therapy

## 2011-04-17 ENCOUNTER — Ambulatory Visit: Payer: Medicare Other | Admitting: Physical Therapy

## 2011-04-19 ENCOUNTER — Ambulatory Visit: Payer: Medicare Other | Admitting: Occupational Therapy

## 2011-04-19 ENCOUNTER — Ambulatory Visit: Payer: Medicare Other | Admitting: Physical Therapy

## 2011-04-23 ENCOUNTER — Ambulatory Visit: Payer: Medicare Other | Admitting: Occupational Therapy

## 2011-04-23 ENCOUNTER — Ambulatory Visit: Payer: Medicare Other | Admitting: Physical Therapy

## 2011-04-25 ENCOUNTER — Ambulatory Visit: Payer: Medicare Other | Attending: Internal Medicine | Admitting: Occupational Therapy

## 2011-04-25 ENCOUNTER — Ambulatory Visit: Payer: Medicare Other | Admitting: Physical Therapy

## 2011-04-25 DIAGNOSIS — R41842 Visuospatial deficit: Secondary | ICD-10-CM | POA: Insufficient documentation

## 2011-04-25 DIAGNOSIS — M2420 Disorder of ligament, unspecified site: Secondary | ICD-10-CM | POA: Insufficient documentation

## 2011-04-25 DIAGNOSIS — M629 Disorder of muscle, unspecified: Secondary | ICD-10-CM | POA: Insufficient documentation

## 2011-04-25 DIAGNOSIS — I69998 Other sequelae following unspecified cerebrovascular disease: Secondary | ICD-10-CM | POA: Insufficient documentation

## 2011-04-25 DIAGNOSIS — R269 Unspecified abnormalities of gait and mobility: Secondary | ICD-10-CM | POA: Insufficient documentation

## 2011-04-25 DIAGNOSIS — M6281 Muscle weakness (generalized): Secondary | ICD-10-CM | POA: Insufficient documentation

## 2011-04-25 DIAGNOSIS — Z5189 Encounter for other specified aftercare: Secondary | ICD-10-CM | POA: Insufficient documentation

## 2011-04-26 ENCOUNTER — Ambulatory Visit: Payer: Medicare Other | Admitting: Occupational Therapy

## 2011-04-26 ENCOUNTER — Ambulatory Visit: Payer: Medicare Other | Admitting: Physical Therapy

## 2011-04-30 ENCOUNTER — Ambulatory Visit: Payer: Medicare Other | Admitting: Physical Therapy

## 2011-04-30 ENCOUNTER — Ambulatory Visit: Payer: Medicare Other | Admitting: Occupational Therapy

## 2011-05-01 ENCOUNTER — Ambulatory Visit: Payer: Medicare Other | Admitting: Occupational Therapy

## 2011-05-01 ENCOUNTER — Ambulatory Visit: Payer: Medicare Other | Admitting: Physical Therapy

## 2011-05-03 ENCOUNTER — Ambulatory Visit: Payer: Medicare Other | Admitting: Occupational Therapy

## 2011-05-03 ENCOUNTER — Ambulatory Visit: Payer: Medicare Other | Admitting: Physical Therapy

## 2011-05-07 ENCOUNTER — Ambulatory Visit: Payer: Medicare Other | Admitting: Occupational Therapy

## 2011-05-07 ENCOUNTER — Ambulatory Visit: Payer: Medicare Other | Admitting: Physical Therapy

## 2011-05-08 NOTE — Assessment & Plan Note (Signed)
OFFICE VISIT   AZAI, GAFFIN  DOB:  18-May-1946                                        May 08, 2010  CHART #:  [id number]   HISTORY:  The patient comes in today with complaints of increased  swelling and redness around his leg incision.  He is status post  coronary artery bypass grafting x5 on May 01, 2010 by Dr. Dorris Fetch.  His postoperative course was significant for mild volume overload, for  which he was started on diuretics.  Also, a mild renal insufficiency,  which had returned to baseline by the time of discharge.  He was  discharged home on May 06, 2010 in good condition.  He states that  throughout his hospital stay and following his discharge, his right  lower extremity was slightly more edematous than the left as this is a  saphenous vein harvest leg.  However, over the past 24 hours, he has  noticed increased erythema around the puncture site at the lower leg and  extending laterally.  The area has become more tender to touch.  The  patient denies any drainage from the incision sites and has had no  fevers or chills.  His only other complaint has been abdominal bloating.  He is only able to eat small amounts several times a day.  He has not  had a bowel movement since early in his hospital stay.  He is passing  flatus and has had no nausea or vomiting.  He is taking the pain  medication frequently.   PHYSICAL EXAMINATION:  Vital Signs:  Blood pressure is 115/77, pulse is  82, respirations 20, and O2 sat 96% on room air.  Chest:  His sternal  incision is healing well.  His central chest tube site has a small  amount of serosanguineous drainage and an area of superficial  separation.  The left lateral chest tube site is healing well.  There is  no erythema.  Heart:  Regular rate and rhythm without murmurs, rubs, or  gallops.  Lungs:  Clear, although breath sounds are slightly diminished  in both bases.  Extremities:  He does have bilateral  lower extremity  edema, slightly greater on the right than the left.  His EVH incisions  are all dry and intact, however, he does have erythema at the right  lower leg stab and graft site, which extends laterally.  The area is  slightly warm to touch and tender to palpation as well.  There is no  calf tenderness to palpation.  There appears to be a moderate amount of  ecchymosis along the entire [facility] tunnel site.   ASSESSMENT/PLAN:  The patient is status post coronary artery bypass  graft x5 on May 01, 2010.  He appears to have an endoscopic vein harvest  site infection and possibly an early cellulitis.  I have started him on  Augmentin 500 mg b.i.d. x1 week.  He also was sent home on a short  course of Lasix and I have extended this for a full week to end when he  returns for followup next week.  I have asked him to keep his legs  elevated as much as possible.  I have also encouraged him to continue  taking stool softeners to help with his constipation issues and he may  take an over-the-counter  laxative as needed.  We will have him return  for followup in 1 week for wound recheck.  I have asked him to call  sooner if the erythema worsens, he develops any drainage from the  incisions, worsening edema, fevers, or chills.   Salvatore Decent Dorris Fetch, M.D.  Electronically Signed   GC/MEDQ  D:  05/08/2010  T:  05/09/2010  Job:  [id number]   cc:   [facility] office  Lyn Records, M.D.

## 2011-05-08 NOTE — Procedures (Signed)
VASCULAR LAB EXAM   INDICATION:  Preop evaluation for bypass graft placement.     HISTORY:  Diabetes:  Yes.  Cardiac:  MI.  Hypertension:  Yes.   EXAM:  Left leg vein mapping.   IMPRESSION:  1. The left greater saphenous vein is compressible with diameter      measurements ranging from 0.36-0.82 cm.  2. The left short saphenous vein is compressible with diameter      measurements ranging from 0.21-0.37 cm.  3. Additional measurements noted on the attached worksheet.    ___________________________________________  V. Charlena Cross, MD   CH/MEDQ  D:  09/04/2010  T:  09/04/2010  Job:  [id number]

## 2011-05-08 NOTE — Assessment & Plan Note (Signed)
OFFICE VISIT   [name], [name]  DOB:  Jun 17, 1946                                       10/16/2010  CHART#:[id number]   The patient comes back in today.  He is status post left femoral to  above-knee popliteal bypass graft on September 22, 2010.  He has had  separation of the below-knee incision.  I have debrided some of tissue  there.  It appears to be very superficial.  I got back to healthy  granulation tissue.  I think this will heal with dressing changes.  I am  arranging for him have hydrogel dressing changes daily from home health  and I will see him back in 3 weeks.     Jorge Ny, MD  Electronically Signed   VWB/MEDQ  D:  10/16/2010  T:  10/17/2010  Job:  [id number]

## 2011-05-08 NOTE — Assessment & Plan Note (Signed)
OFFICE VISIT   [name], [name]  DOB:  Mar 28, 1946                                       11/06/2010  CHART#:[id number]   The patient is status post left femoral to above knee popliteal bypass  graft with reversed ipsilateral greater saphenous vein and  endarterectomy of his left common femoral and profunda femoral artery on  09/22/2010.  He has been doing well but he has had a wound separation of  his distal incision.  He is doing dressing changes with this.  It  appears to be very healthy with good granulation tissue at the base.  I  am going to continue doing dressing changes and see him back in a month.  There was nothing to debride today.     Jorge Ny, MD  Electronically Signed   VWB/MEDQ  D:  11/06/2010  T:  11/07/2010  Job:  [phone]

## 2011-05-08 NOTE — Assessment & Plan Note (Signed)
OFFICE VISIT   Craig, [name]  DOB:  12/31/1945                                        May 29, 2010  CHART #:  [id number]   HISTORY:  The patient comes in today for a 1-week followup.  He was seen  last week in our office for evaluation of what was a resolving right  lower extremity cellulitis and infected EVH site.  He has been treated  with 2-week course of Augmentin, of which he has approximately 2 more  doses.  He states that over the last week his leg has significantly  improved.  He still has a small amount of swelling in both lower  extremities but pretty much the tenderness and erythema have resolved.  Otherwise, he is doing well.  He is status post coronary artery bypass  grafting x5 on May 01, 2010.  He has been seen in follow up by Dr. Katrinka [name]  and by Dr. Donette [name].  He had been running low blood pressures in the 80-90  systolic range and his lisinopril has been discontinued.  Otherwise, he  has progressed as expected.  He is scheduled to begin cardiac rehab  phase II orientation this week.  He is slowly improving with stamina and  mobility.  He denies any discomfort or shortness of breath.   PHYSICAL EXAMINATION:  Blood pressure is 108/75, pulse is 75,  respirations 18, O2 sat 97% on room air.  His sternal incision has  healed well and sternum is stable to palpation.  Heart is regular rate  and rhythm without murmurs, rubs, or gallops.  Lungs are clear.  Lower  extremities show chronic venous stasis changes, but all EVH sites have  healed well and there is no erythema or drainage.  He does have some  mild chronic lower extremity edema, but this has significantly improved  from his last visit as well.   ASSESSMENT AND PLAN:  The patient is progressing well status post  coronary artery bypass graft.  His right lower extremity cellulitis has  resolved at this point.  I have asked him to finish up the course of  antibiotics with the last 2 doses and  I do not feel that he will need  any further treatment.  Also, he may begin to increase his activity at  this point including driving.  I do not feel that he needs to return on  a regular basis for follow up in our office; however, I have asked him  to obtain a chest x-ray in [facility] as he leaves the office  today.  I will review  this, and if there are any issues I will call him with the results.  Otherwise, he will follow up as directed with Dr. Katrinka [name] and with Dr.  Donette [name].   [name] [name], M.D.  Electronically Signed   GC/MEDQ  D:  05/29/2010  T:  05/30/2010  Job:  [id number]   cc:   [name], M.D.  Craig Housekeeper, MD

## 2011-05-08 NOTE — Assessment & Plan Note (Signed)
OFFICE VISIT   [name], [name]  DOB:  09-29-1946                                        May 23, 2010  CHART #:  [id number]   REASON FOR OFFICE VISIT:  Cellulitis of right lower extremity.   HISTORY OF PRESENT ILLNESS:  This is a 65 year old African American male  who is status post CABG x5 on May 01, 2010.  The patient has been seen on  two separate occasions regarding cellulitis of the right lower  extremity.  The patient was last seen in the office on May 15, 2010.  The patient had tenderness and swelling.  He denied any fever or chills.  He had been previously placed on Augmentin, which he took for 5 days.  It was felt that he needed a longer course of antibiotics and he was  given one more week's worth.  The patient states he now has less  swelling, tenderness, and erythema and he has just finished his second  course of Augmentin.  He denies any fever, chills, chest pain, or  shortness of breath.   PHYSICAL EXAMINATION:  General:  This is a pleasant 65 year old [place]  American male, who is in no acute distress, who is alert, oriented, and  cooperative.  Vital Signs:  His vital signs are as follows.  BP 85/62,  heart rate 81, respirations 18, and O2 sat 96% on room air.  Cardiovascular:  Regular rate and rhythm.  Pulmonary:  Clear to  auscultation bilaterally.  No rales, wheezes, or rhonchi.  Sternal wound  is clean and dry.  There is a scab on the midline, this was removed.  There is also a scab on the previous right chest tube site, which was  removed.  Extremities:  Right lower extremity, positive swelling, minor  erythema, and tenderness.  Right lower extremity wound is clean, dry,  and well healed.  Right thigh is soft.   IMPRESSION AND PLAN:  Cellulitis of the right lower extremity.  The  patient will be continued on Augmentin for one more week.  He will  return to the office next Monday for followup appointment.  The patient  was  instructed if he develops any fever, chills, increased redness,  tenderness, or erythema, he is to contact the office immediately.  At  this time, the patient does not need to be admitted for the  administration of IV antibiotics.  We will continue to monitor his  progress closely, but his right lower extremity cellulitis is continuing  to improve.  Regarding the patient's blood pressure at today's office  visit, the patient has typically been in the low 100s/60s.  The patient  states he was seen by Dr. Katrinka Blazing recently and his blood pressure was also  lower there.  There have been no adjustments made to his medication.  He  is currently on atenolol 50 mg p.o. daily as well as lisinopril 10 mg  p.o. daily.  The patient was instructed to obtain blood pressure  readings and keep a log.  He was also instructed to contact our office  or Dr. Katrinka Blazing if he develops any dizziness or lightheadedness as he may  need to have adjustments either to his atenolol or lisinopril.   Salvatore Decent Dorris Fetch, M.D.  Electronically Signed   DZ/MEDQ  D:  05/23/2010  T:  05/24/2010  Job:  [id number]   cc:   Lyn Records, M.D.

## 2011-05-08 NOTE — Assessment & Plan Note (Signed)
OFFICE VISIT   [name], [name]  DOB:  1946-01-18                                        May 15, 2010  CHART #:  [id number]   HISTORY:  The patient is seen in routine followup following coronary  artery bypass grafting x5 done May 01, 2010 by Dr. Dorris Fetch.  He was  last seen in the office on May 08, 2010, at which time he was noted to  have some increased erythema in the right lower leg at the vein harvest  site associated with tenderness and swelling.  He is seen on today's  date to reevaluate this.  Currently, he reports that he does feel  somewhat better, but there is still continues to be some tenderness and  he also notes that he continues to be somewhat red.  He denies fevers,  chills, or other constitutional symptoms.   PHYSICAL EXAMINATION:  Vital Signs:  Blood pressure is 103/68, pulse 74,  respirations 16, and oxygen saturation is 97% on room air.  Extremities:  The right lower extremity is inspected.  There does continue to be some  pretibial edema and erythema.  It is tender to palpation.   ASSESSMENT/PLAN:  Continued cellulitis associated with right lower  extremity vein harvest site in the setting of chronic venous stasis.  The patient reports that he only took 5 days worth of antibiotics.  There is some improvement as compared to previous evaluation, it may be  that he just was not treated with a long enough course.  I will give him  another week of Augmentin 500 mg p.o. b.i.d. and we will see him again  in 1 week.  If it is better at that time, consideration will be made to  change the antibiotics versus the possible need for intravenous  antibiotics.  If it worsens prior to then, we will see him of course on  a p.r.n. basis.   Rowe Clack, P.A.-C.   Sherryll Burger  D:  05/15/2010  T:  05/16/2010  Job:  [id number]   cc:   Lyn Records, M.D.

## 2011-05-08 NOTE — Procedures (Signed)
   NAME:  [name], [name]            ACCOUNT NO.:  [id number]   MEDICAL RECORD NO.:  [id number]          PATIENT TYPE:  AMB   LOCATION:  SDS                          FACILITY:  [facility]   PHYSICIAN:  Corky Crafts, MDDATE OF BIRTH:  March 30, 1946   DATE OF PROCEDURE:  08/24/2010  DATE OF DISCHARGE:                    PERIPHERAL VASCULAR INVASIVE PROCEDURE   PRIMARY CARDIOLOGIST:  Lyn Records, M.D.   PROCEDURE PERFORMED:  1. Abdominal aortogram.  2. Bilateral lower extremity runoff pelvic angiogram.   OPERATOR:  Corky Crafts, MD   INDICATIONS:  Claudication.   PROCEDURE NARRATIVE:  The risks and benefits of PV angiography were  explained to the patient and informed consent was obtained.  He was  brought to the [facility] laboratory.  He was prepped and draped in the usual  sterile fashion.  His right groin was infiltrated 1% lidocaine.  A 5-  French sheath was placed into the right common femoral artery using  modified Seldinger technique.  A pigtail catheter was advanced through  the abdominal aorta under fluoroscopic guidance.  Power injection of  contrast was performed in the AP projection image of the abdominal  aorta.  The catheter was then withdrawn to the aortoiliac bifurcation.  Power injection of contrast was performed in the AP projection image of  both lower extremities and LAO projection of the pelvic vasculature was  then performed using the pigtail catheter.   FINDINGS:  There is no abdominal aortic aneurysm.  There are bilateral  single renal arteries, both of which were widely patent.  There is mild  atherosclerosis of the aorta.  The aortoiliac bifurcation is widely  patent.  Bilateral common iliacs and bilateral external iliacs appear  widely patent.  Both internal iliacs appear widely patent.   Right leg:  Common femoral artery, profunda femoral artery are widely  patent.  The SFA has mild atherosclerosis to the mid portion.  The  popliteal artery is  widely patent.  There is patent 3-vessel runoff  below the knee.   Left leg:  Shows a patent common femoral artery.  The superficial  femoral artery is occluded at the origin.  The profunda femoral artery  has a 90% ostial with a proximal stenosis.  The SFA reconstitutes  distally.  There is 3-vessel runoff below the leg.  There also appears  to be a collateral from the external iliac to the profunda femoral  artery.   IMPRESSION:  1. Widely patent right lower extremity vasculature.  2. Severe left lower extremity atherosclerotic disease at the      superficial femoral artery/profunda femoral artery bifurcation and      extending into the mid SFA.   RECOMMENDATIONS:  This is not a good candidate for endovascular repair.  Discussed case with vascular surgery and consider femoral popliteal  bypass.      Corky Crafts, MD     JSV/MEDQ  D:  08/24/2010  T:  08/24/2010  Job:  [id number]   Electronically Signed by Lance Muss MD on 08/31/2010 12:04:52 PM

## 2011-05-08 NOTE — Assessment & Plan Note (Signed)
OFFICE VISIT   [name], [name]  DOB:  1946/04/10                                       12/04/2010  CHART#:[id number]   The patient comes back in today for a wound check.  He underwent left  fem-pop bypass graft with ipsilateral reversed greater saphenous vein,  and simultaneous common femoral endarterectomy and profunda femoral  endarterectomy on 09/22/2010.  He had some separation of his distal  incision that he has been doing hydrogel dressing changes to.  He states  that his claudication symptoms have resolved.  He still has some mild  edema in his leg.  On inspection of his incision it is nearly healed.  I  touched it with silver nitrate today.  I told him to just do dry  dressing changes.  I will plan on seeing him back in 6 weeks for his  first surveillance duplex.     Jorge Ny, MD  Electronically Signed   VWB/MEDQ  D:  12/04/2010  T:  12/04/2010  Job:  [id number]   cc:   Corky Crafts, MD

## 2011-05-08 NOTE — Assessment & Plan Note (Signed)
OFFICE VISIT   [name], [name]  DOB:  1946/05/19                                       09/04/2010  CHART#:[id number]   REASON FOR VISIT:  Left leg claudication.   HISTORY:  This is a 65 year old gentleman I am seeing at the request of  Dr.  Eldridge Dace for evaluation of left leg claudication.  The patient is  status post coronary artery bypass graft in May, and has been in cardiac  rehab with  walking on the a treadmill, he has to stop in approximately  3 to 4 minutes secondary to cramping in his left leg.  After resting for  awhile, the pain goes away and he is able to resume his walking.  He was  taken for arteriogram by Dr. Eldridge Dace which revealed a high-grade left  profunda femoral stenosis and an occluded left superficial femoral  artery.  He was referred for surgical revascularization.  He has a three-  vessel runoff.   The patient suffers from coronary artery disease.  He is recently status  post CABG by Dr. Orson Aloe.  He is also medically managed for his  hypercholesterolemia.  He had a former heart attack in 1994.   REVIEW OF SYSTEMS:  GENERAL:  Negative for fevers, chills, weight gain,  weight loss.  VASCULAR:  Positive for pain in the legs when walking.  CARDIAC:  Negative.  GI:  Negative.  NEURO:  Negative.  PULMONARY:  Negative.  HEMATOLOGIC:  Negative.  GU:  Negative.  ENT:  Negative.  MUSCULOSKELETAL:  Negative.  PSYCHIATRIC:  Negative.   PAST MEDICAL HISTORY:  Hypertension, hyperlipidemia, erectile  dysfunction, coronary artery disease, umbilical hernia repair, type 2  diabetes.   PAST SURGICAL HISTORY:  Open heart surgery May 01, 2010.   SOCIAL HISTORY:  Married with 2 children.  Does not smoke, quit IN 1994.  Does not drink.   FAMILY HISTORY:  Positive for  coronary artery disease in his father who  died of an MI in his 60s.   PHYSICAL EXAMINATION:  VITAL SIGNS:  Heart rate 63, blood pressure  130/91, temperature is 98.2.   General:  Well-appearing, in no distress.  HEENT:  Within normal limits.  Lungs:  Clear bilaterally, no wheezes or  rhonchi.  Cardiovascular:  Regular rate and rhythm.  No carotid bruits,  palpable femoral pulses bilaterally.  Abdomen:  Obese but soft.  No  hepatosplenomegaly.  Musculoskeletal:  Without major deformities.  Neurologic:  He has no focal deficits or weakness.  SKIN:  Without rash.   DIAGNOSTIC STUDIES:  I have ordered a vein mapping which shows him to  have an excellent greater saphenous vein on the left; the right has been  surgically harvested.   ASSESSMENT/PLAN:  Left leg claudication.   PLAN:  The patient will be scheduled for a left above-knee femoral-  popliteal bypass graft.  A profundoplasty will need to be done given the  high-grade stenosis in his profunda femoral artery.  I will plan on  accessing the distal above knee popliteal artery.  Risks and benefits of  the procedure were discussed with the patient including risks of  infection,  long-term patency, need for surveillance, cardiopulmonary  complications and infection.  All of his questions were answered today.  His operation has been scheduled for Friday,  September 30.  Jorge Ny, MD  Electronically Signed   VWB/MEDQ  D:  09/04/2010  T:  09/05/2010  Job:  [id number]   cc:   Corky Crafts, MD  Salvatore Decent Dorris Fetch, M.D.  Lyn Records, M.D.

## 2011-05-08 NOTE — Assessment & Plan Note (Signed)
OFFICE VISIT   ISIDOR, [name]  DOB:  [birth year]                                       10/02/2010  CHART#:[id number]   Patient comes back in today for his first postoperative visit.  He is  status post left femoral-popliteal bypass graft with vein performed on  September 30th.  He has been doing well at home.  He has had a small  amount of drainage from his above-knee incision but otherwise is doing  well.  He has complained of some swelling and bowel irregularity.   His incisions appear well healed today.  There is some slight separation  in the distal incision.  He does have significant edema in his left leg.   Ultrasound was performed today, which shows bilateral ABIs of 1.0   I think he is recovering nicely from his operation.  I am actually very  pleased with his progress.  I am going to give him 5 days worth of Lasix  to help with his swelling and place him in a 15-20 mm compression  stocking.  He is going to come back in 3 months with an ultrasound to  evaluate his bypass.  He will call me before then if he has any  problems.     Jorge Ny, MD  Electronically Signed   VWB/MEDQ  D:  10/02/2010  T:  10/02/2010  Job:  [id number]   cc:   Corky Crafts, MD

## 2011-05-09 ENCOUNTER — Ambulatory Visit: Payer: Medicare Other | Admitting: Occupational Therapy

## 2011-05-09 ENCOUNTER — Ambulatory Visit: Payer: Medicare Other | Admitting: Rehabilitative and Restorative Service Providers"

## 2011-05-10 ENCOUNTER — Ambulatory Visit: Payer: Medicare Other | Admitting: Occupational Therapy

## 2011-05-10 ENCOUNTER — Ambulatory Visit: Payer: Medicare Other | Admitting: Physical Therapy

## 2011-05-11 ENCOUNTER — Ambulatory Visit (HOSPITAL_BASED_OUTPATIENT_CLINIC_OR_DEPARTMENT_OTHER): Payer: Medicare Other | Admitting: Physical Medicine & Rehabilitation

## 2011-05-11 ENCOUNTER — Encounter: Payer: Medicare Other | Attending: Physical Medicine & Rehabilitation

## 2011-05-11 DIAGNOSIS — G8110 Spastic hemiplegia affecting unspecified side: Secondary | ICD-10-CM

## 2011-05-11 DIAGNOSIS — I2510 Atherosclerotic heart disease of native coronary artery without angina pectoris: Secondary | ICD-10-CM | POA: Insufficient documentation

## 2011-05-11 DIAGNOSIS — Z951 Presence of aortocoronary bypass graft: Secondary | ICD-10-CM | POA: Insufficient documentation

## 2011-05-11 DIAGNOSIS — I739 Peripheral vascular disease, unspecified: Secondary | ICD-10-CM | POA: Insufficient documentation

## 2011-05-11 DIAGNOSIS — I69959 Hemiplegia and hemiparesis following unspecified cerebrovascular disease affecting unspecified side: Secondary | ICD-10-CM | POA: Insufficient documentation

## 2011-05-11 NOTE — Op Note (Signed)
NAME:  [name], [name]            ACCOUNT NO.:  [id number]   MEDICAL RECORD NO.:  [id number]          PATIENT TYPE:  AMB   LOCATION:  ENDO                         FACILITY:  [facility]   PHYSICIAN:  Danise Edge, M.D.   DATE OF BIRTH:  1946-06-01   DATE OF PROCEDURE:  03/29/2005  DATE OF DISCHARGE:                                 OPERATIVE REPORT   PROCEDURE:  Screening colonoscopy.   INDICATIONS:  Mr. [name] is a 65 year old male born April 09, 1946. Mr. [name] was scheduled to undergo his first screening colonoscopy  with polypectomy to prevent colon cancer.   ENDOSCOPIST:  Danise Edge, M.D.   PREMEDICATION:  Versed 4 mg, Demerol 50 mg.   PROCEDURE:  Versed 4 mg, Demerol 50 mg.   After obtaining informed consent, Mr. [name] was placed in the left lateral  decubitus position. I administered intravenous Demerol and intravenous  Versed to achieve conscious sedation for the procedure. The patient's blood  pressure, oxygen saturation and cardiac rhythm were monitored throughout the  procedure and documented in the medical record.   Anal inspection and digital rectal exam were normal. The prostate was  nonnodular. The Olympus adjustable pediatric colonoscope was introduced into  the rectum and advanced to the cecum. The appendiceal orifice and ileocecal  valve were identified. Colonic preparation for the exam today was excellent.   Mr. [name] has universal colonic diverticulosis.   RECTUM:  I was unable to perform a retroflexed view of the distal rectum. On  the distal rectum, a 1-mm sessile polyp was removed with the cold biopsy  forceps.  SIGMOID COLON AND DESCENDING COLON:  Normal.  SPLENIC FLEXURE:  Normal.  TRANSVERSE COLON:  Normal.  HEPATIC FLEXURE:  Normal.  ASCENDING COLON:  From the proximal ascending colon, a 1 mm sessile polyp  was removed with cold biopsy forceps.  CECUM AND ILEOCECAL VALVE:  Normal.   ASSESSMENT:  1.  A diminutive polyp was  removed from the proximal ascending colon and a      diminutive polyp was removed from the distal rectum.  2.  Universal colonic diverticulosis.      MJ/MEDQ  D:  03/29/2005  T:  03/29/2005  Job:  [id number]   cc:   Georgann Housekeeper, MD  [address]  [place]  [place] [address]  Fax: [phone]

## 2011-05-12 NOTE — Assessment & Plan Note (Signed)
REASON FOR VISIT:  Right-sided weakness status post stroke.  A 65 year old male with history of coronary artery disease status post CABG May 2011.  He has peripheral vascular disease and was admitted with chest pain, January 29, 2011, cardiac cath revealing coronary artery occlusion, underwent stenting but had a peri-procedure CVA.  MRI showed acute infarcts throughout the left PCA distribution involving the left cerebellum, but he also had bilateral thalamic and right occipital lobe infarcts.  He had severe basilar artery stenosis.  Large basilar artery clot, placed on Plavix, went through inpatient rehabilitation, February 07, 2011, through March 07, 2011, received home health therapy.  He now switched to outpatient therapy.  He still gets outpatient PT and OT and is starting again on outpatient speech.  His last appointment with me on April 10, 2011.  His cardiologist, Dr. Verdis Prime, and his primary care physician Dr. Donette Larry, his neurologist, Dr. Delia Heady.  FUNCTIONAL STATUS:  He walks in therapy with a walker and AFO, mainly uses a wheelchair around the house.  He needs assist with bathing and toileting.  He complains of dizziness on his review of systems.  SOCIAL HISTORY:  Married, lives with his wife who provides 24-hour supervision and assistance as needed.  PHYSICAL EXAMINATION:  VITAL SIGNS:  Blood pressure 123/80, pulse 64, respirations 18, O2 sat 98% on room air. NEUROLOGIC:  Cranial nerves, right central VII. MUSCULOSKELETAL:  Upper extremity strength is 3- in the right deltoid biceps, triceps grip.  In the lower extremity, he has 3- in the hip flexor, knee extensor, and even 2- at the ankle dorsiflexor, this is a big improvement compared to last month.  When he was basically in a 2/5 range at most with the severe synergy pattern which has now improved.  IMPRESSION: 1. Right spastic hemiplegia due to cerebrovascular accident,     spasticity is not a big issue at  this point.  He does have     hyperactive reflexes particularly in the right biceps, but really     no disabling spasticity. 2. Coronary artery disease followup with Cardiology. 3. Stroke prophylaxis.  Continue Plavix, followup with Dr. Pearlean Brownie as     per his office, he last saw Dr. Pearlean Brownie, April 16, 2011. 4. Primary care followup.  He would like to trim his medication list,     the only one I would feel comfortable trimming off his list would     be the Pepcid, he has no GI discomfort, they will stop it, with 20     mg once or twice a day currently.  I will see him back in 6 weeks.     Erick Colace, M.D. Electronically Signed    AEK/MedQ D:  05/11/2011 12:09:06  T:  05/12/2011 00:34:05  Job #:  [id number]

## 2011-05-16 ENCOUNTER — Ambulatory Visit: Payer: Medicare Other | Admitting: Physical Therapy

## 2011-05-16 ENCOUNTER — Ambulatory Visit: Payer: Medicare Other | Admitting: *Deleted

## 2011-05-17 ENCOUNTER — Ambulatory Visit: Payer: Medicare Other | Admitting: Physical Therapy

## 2011-05-22 ENCOUNTER — Ambulatory Visit: Payer: Medicare Other | Admitting: Occupational Therapy

## 2011-05-22 ENCOUNTER — Ambulatory Visit: Payer: Medicare Other | Admitting: Physical Therapy

## 2011-05-24 ENCOUNTER — Ambulatory Visit: Payer: Medicare Other

## 2011-05-24 ENCOUNTER — Encounter: Payer: Medicare Other | Admitting: Occupational Therapy

## 2011-05-24 DIAGNOSIS — F4321 Adjustment disorder with depressed mood: Secondary | ICD-10-CM

## 2011-05-29 ENCOUNTER — Ambulatory Visit: Payer: Medicare Other | Attending: Internal Medicine | Admitting: Physical Therapy

## 2011-05-29 ENCOUNTER — Ambulatory Visit: Payer: Medicare Other | Admitting: Occupational Therapy

## 2011-05-29 DIAGNOSIS — R41842 Visuospatial deficit: Secondary | ICD-10-CM | POA: Insufficient documentation

## 2011-05-29 DIAGNOSIS — I69998 Other sequelae following unspecified cerebrovascular disease: Secondary | ICD-10-CM | POA: Insufficient documentation

## 2011-05-29 DIAGNOSIS — R269 Unspecified abnormalities of gait and mobility: Secondary | ICD-10-CM | POA: Insufficient documentation

## 2011-05-29 DIAGNOSIS — M2420 Disorder of ligament, unspecified site: Secondary | ICD-10-CM | POA: Insufficient documentation

## 2011-05-29 DIAGNOSIS — M629 Disorder of muscle, unspecified: Secondary | ICD-10-CM | POA: Insufficient documentation

## 2011-05-29 DIAGNOSIS — M6281 Muscle weakness (generalized): Secondary | ICD-10-CM | POA: Insufficient documentation

## 2011-05-29 DIAGNOSIS — Z5189 Encounter for other specified aftercare: Secondary | ICD-10-CM | POA: Insufficient documentation

## 2011-05-31 ENCOUNTER — Ambulatory Visit: Payer: Medicare Other | Admitting: Occupational Therapy

## 2011-05-31 ENCOUNTER — Ambulatory Visit: Payer: Medicare Other | Admitting: Physical Therapy

## 2011-06-05 ENCOUNTER — Ambulatory Visit: Payer: Medicare Other | Admitting: Physical Therapy

## 2011-06-05 ENCOUNTER — Ambulatory Visit: Payer: Medicare Other | Admitting: Occupational Therapy

## 2011-06-07 ENCOUNTER — Ambulatory Visit: Payer: Medicare Other | Admitting: Physical Therapy

## 2011-06-07 ENCOUNTER — Encounter: Payer: Medicare Other | Admitting: Occupational Therapy

## 2011-06-11 ENCOUNTER — Ambulatory Visit: Payer: Medicare Other | Admitting: Occupational Therapy

## 2011-06-11 ENCOUNTER — Ambulatory Visit: Payer: Medicare Other | Admitting: Physical Therapy

## 2011-06-12 ENCOUNTER — Ambulatory Visit: Payer: Medicare Other | Admitting: Rehabilitative and Restorative Service Providers"

## 2011-06-12 ENCOUNTER — Ambulatory Visit: Payer: Medicare Other | Admitting: Occupational Therapy

## 2011-06-18 ENCOUNTER — Ambulatory Visit: Payer: Medicare Other | Admitting: Physical Therapy

## 2011-06-18 ENCOUNTER — Ambulatory Visit: Payer: Medicare Other | Admitting: Occupational Therapy

## 2011-06-18 ENCOUNTER — Encounter: Payer: Medicare Other | Attending: Physical Medicine & Rehabilitation

## 2011-06-18 ENCOUNTER — Ambulatory Visit (HOSPITAL_BASED_OUTPATIENT_CLINIC_OR_DEPARTMENT_OTHER): Payer: Medicare Other | Admitting: Physical Medicine & Rehabilitation

## 2011-06-18 DIAGNOSIS — I2510 Atherosclerotic heart disease of native coronary artery without angina pectoris: Secondary | ICD-10-CM | POA: Insufficient documentation

## 2011-06-18 DIAGNOSIS — I739 Peripheral vascular disease, unspecified: Secondary | ICD-10-CM | POA: Insufficient documentation

## 2011-06-18 DIAGNOSIS — Z951 Presence of aortocoronary bypass graft: Secondary | ICD-10-CM | POA: Insufficient documentation

## 2011-06-18 DIAGNOSIS — G8110 Spastic hemiplegia affecting unspecified side: Secondary | ICD-10-CM

## 2011-06-18 DIAGNOSIS — I69959 Hemiplegia and hemiparesis following unspecified cerebrovascular disease affecting unspecified side: Secondary | ICD-10-CM | POA: Insufficient documentation

## 2011-06-19 NOTE — Assessment & Plan Note (Signed)
REASON FOR VISIT:  Right-sided weakness due to stroke.  A 65 year old male who I saw last on May 11, 2011.  He has a history of CVA in the left cerebellar area, bilateral thalamic and right occipital area.  He has severe basilar stenosis, placed on Plavix, went through inpatient rehab from February 07, 2011 to March 07, 2011.  He switched to outpatient therapy, continues with speech, is on PT, OT.  Follows with Dr. Katrinka Blazing from Cardiology.  PRIMARY CARE PHYSICIAN:  Georgann Housekeeper, MD  NEUROLOGIST:  Pramod P. Pearlean Brownie, MD  FUNCTIONAL STATUS:  Walks in Therapy with a walker he states and he does as well with an AFO as well as without, but I do not have any therapy notes that indicate this.  He has seen Dr. Leonides Cave from Psychology.  He had no major depressive symptoms mainly adjustment reaction.  Examination right deltoid 3-, right biceps 3-, right triceps 3-, right- sided grip 3-, right lower extremity 3- in hip flexor, knee extensor and 2- at the ankle dorsiflexor which was equivalent to May 12, 2011.  Sensation is equal bilateral upper and lower extremity.  Cranial nerves intact with exception of right central VII.  Functional status needs help with dressing and bathing still.  Mobility as above.  He uses a wheelchair to come to the office.  Social married and lives with his wife.  Nonsmoker, nondrinker.  Blood pressure 129/75, pulse 81, respirations 18 and O2 sat 96% on room air.  This is mildly overweight male, in no acute distress.  Mood and affect are appropriate.  Motor exam as above.  IMPRESSION:  Right spastic hemiplegia plateaued in terms of motor strength compared to 6 weeks ago.  As I explained to the patient, he may get some further improvements over the next 2 months, but I really do not expect any measurable gains beyond 6 months post stroke.  We will see him back in August around the 74-month mark.  We will continue outpatient therapy for now as he is continuing to  make some compensatory gains.  Discussed with the patient and wife agree with plan.  Medical followup per Dr. Donette Larry.  Cardiology followup per Dr. Katrinka Blazing.  Neurology followup per Dr. Pearlean Brownie.     Erick Colace, M.D. Electronically Signed    AEK/MedQ D:  06/18/2011 11:48:35  T:  06/19/2011 00:30:03  Job #:  [id number]

## 2011-06-21 ENCOUNTER — Encounter (INDEPENDENT_AMBULATORY_CARE_PROVIDER_SITE_OTHER): Payer: Medicare Other

## 2011-06-21 ENCOUNTER — Ambulatory Visit: Payer: Medicare Other | Admitting: Physical Therapy

## 2011-06-21 ENCOUNTER — Ambulatory Visit: Payer: Medicare Other | Admitting: Occupational Therapy

## 2011-06-21 DIAGNOSIS — I739 Peripheral vascular disease, unspecified: Secondary | ICD-10-CM

## 2011-06-21 DIAGNOSIS — Z48812 Encounter for surgical aftercare following surgery on the circulatory system: Secondary | ICD-10-CM

## 2011-06-26 ENCOUNTER — Ambulatory Visit: Payer: Medicare Other | Admitting: Occupational Therapy

## 2011-06-26 ENCOUNTER — Ambulatory Visit: Payer: Medicare Other | Attending: Internal Medicine | Admitting: Physical Therapy

## 2011-06-26 DIAGNOSIS — I69919 Unspecified symptoms and signs involving cognitive functions following unspecified cerebrovascular disease: Secondary | ICD-10-CM | POA: Insufficient documentation

## 2011-06-26 DIAGNOSIS — M6281 Muscle weakness (generalized): Secondary | ICD-10-CM | POA: Insufficient documentation

## 2011-06-26 DIAGNOSIS — R269 Unspecified abnormalities of gait and mobility: Secondary | ICD-10-CM | POA: Insufficient documentation

## 2011-06-26 DIAGNOSIS — M2420 Disorder of ligament, unspecified site: Secondary | ICD-10-CM | POA: Insufficient documentation

## 2011-06-26 DIAGNOSIS — Z5189 Encounter for other specified aftercare: Secondary | ICD-10-CM | POA: Insufficient documentation

## 2011-06-26 DIAGNOSIS — I69928 Other speech and language deficits following unspecified cerebrovascular disease: Secondary | ICD-10-CM | POA: Insufficient documentation

## 2011-06-26 DIAGNOSIS — M629 Disorder of muscle, unspecified: Secondary | ICD-10-CM | POA: Insufficient documentation

## 2011-06-26 DIAGNOSIS — I69998 Other sequelae following unspecified cerebrovascular disease: Secondary | ICD-10-CM | POA: Insufficient documentation

## 2011-06-29 ENCOUNTER — Ambulatory Visit: Payer: Medicare Other | Admitting: Rehabilitative and Restorative Service Providers"

## 2011-07-02 ENCOUNTER — Ambulatory Visit: Payer: Medicare Other | Admitting: Occupational Therapy

## 2011-07-02 ENCOUNTER — Ambulatory Visit: Payer: Medicare Other | Admitting: Physical Therapy

## 2011-07-05 ENCOUNTER — Ambulatory Visit: Payer: Medicare Other | Admitting: Occupational Therapy

## 2011-07-05 ENCOUNTER — Ambulatory Visit: Payer: Medicare Other | Admitting: Physical Therapy

## 2011-07-10 ENCOUNTER — Ambulatory Visit: Payer: Medicare Other | Admitting: Physical Therapy

## 2011-07-11 ENCOUNTER — Ambulatory Visit: Payer: Medicare Other | Admitting: Physical Therapy

## 2011-07-13 ENCOUNTER — Ambulatory Visit: Payer: Medicare Other | Admitting: Rehabilitative and Restorative Service Providers"

## 2011-07-17 ENCOUNTER — Ambulatory Visit: Payer: Medicare Other | Admitting: Rehabilitative and Restorative Service Providers"

## 2011-07-17 ENCOUNTER — Ambulatory Visit: Payer: Medicare Other | Admitting: Occupational Therapy

## 2011-07-17 NOTE — Procedures (Unsigned)
BYPASS GRAFT EVALUATION  INDICATION:  Follow up left fem-to-above-knee graft placed, 09/22/2010.  HISTORY: Diabetes:  No. Cardiac:  Yes. Hypertension:  Yes. Smoking:  Previous. Previous Surgery:  Left lower extremity graft.  SINGLE LEVEL ARTERIAL EXAM                              RIGHT              LEFT Brachial:                    130                135 Anterior tibial:             130                126 Posterior tibial:            139                135 Peroneal: Ankle/brachial index:        1.03               1.0  PREVIOUS ABI:  Date: 01/15/11  RIGHT:  1.22  LEFT:  1.24  LOWER EXTREMITY BYPASS GRAFT DUPLEX EXAM:  DUPLEX: 1. Widely patent left fem-to-above-knee popliteal graft with triphasic     waveforms throughout. 2. The distal anastomosis is not well visualized due to surgical     scarring and depth. 3. Please see attached worksheet for velocity details.  IMPRESSION:  Patent left femoral-popliteal graft, as described above.  ___________________________________________ V. Charlena Cross, MD  LT/MEDQ  D:  06/21/2011  T:  06/21/2011  Job:  [id number]

## 2011-07-20 ENCOUNTER — Ambulatory Visit: Payer: Medicare Other | Admitting: Rehabilitative and Restorative Service Providers"

## 2011-07-20 ENCOUNTER — Ambulatory Visit: Payer: Medicare Other | Admitting: Occupational Therapy

## 2011-07-23 ENCOUNTER — Ambulatory Visit: Payer: Medicare Other | Admitting: Occupational Therapy

## 2011-07-23 ENCOUNTER — Ambulatory Visit: Payer: Medicare Other | Admitting: Rehabilitative and Restorative Service Providers"

## 2011-07-25 ENCOUNTER — Encounter: Payer: Medicare Other | Admitting: Occupational Therapy

## 2011-07-25 ENCOUNTER — Ambulatory Visit: Payer: Medicare Other | Admitting: Rehabilitative and Restorative Service Providers"

## 2011-07-30 ENCOUNTER — Ambulatory Visit: Payer: Medicare Other | Admitting: Rehabilitative and Restorative Service Providers"

## 2011-07-30 ENCOUNTER — Encounter: Payer: Medicare Other | Admitting: Occupational Therapy

## 2011-08-01 ENCOUNTER — Ambulatory Visit: Payer: Medicare Other | Attending: Internal Medicine | Admitting: Physical Therapy

## 2011-08-01 ENCOUNTER — Ambulatory Visit: Payer: Medicare Other | Admitting: Occupational Therapy

## 2011-08-01 DIAGNOSIS — Z5189 Encounter for other specified aftercare: Secondary | ICD-10-CM | POA: Insufficient documentation

## 2011-08-01 DIAGNOSIS — I69998 Other sequelae following unspecified cerebrovascular disease: Secondary | ICD-10-CM | POA: Insufficient documentation

## 2011-08-01 DIAGNOSIS — M629 Disorder of muscle, unspecified: Secondary | ICD-10-CM | POA: Insufficient documentation

## 2011-08-01 DIAGNOSIS — M2420 Disorder of ligament, unspecified site: Secondary | ICD-10-CM | POA: Insufficient documentation

## 2011-08-01 DIAGNOSIS — R269 Unspecified abnormalities of gait and mobility: Secondary | ICD-10-CM | POA: Insufficient documentation

## 2011-08-01 DIAGNOSIS — R41842 Visuospatial deficit: Secondary | ICD-10-CM | POA: Insufficient documentation

## 2011-08-01 DIAGNOSIS — M6281 Muscle weakness (generalized): Secondary | ICD-10-CM | POA: Insufficient documentation

## 2011-08-06 ENCOUNTER — Ambulatory Visit: Payer: Medicare Other | Admitting: Physical Therapy

## 2011-08-06 ENCOUNTER — Ambulatory Visit: Payer: Medicare Other | Admitting: Occupational Therapy

## 2011-08-07 ENCOUNTER — Ambulatory Visit: Payer: Medicare Other | Admitting: Occupational Therapy

## 2011-08-07 ENCOUNTER — Ambulatory Visit: Payer: Medicare Other | Admitting: Physical Therapy

## 2011-08-13 ENCOUNTER — Encounter: Payer: Medicare Other | Admitting: Occupational Therapy

## 2011-08-13 ENCOUNTER — Ambulatory Visit: Payer: Medicare Other | Admitting: Physical Therapy

## 2011-08-16 ENCOUNTER — Ambulatory Visit: Payer: Medicare Other | Admitting: Neurosurgery

## 2011-08-17 ENCOUNTER — Ambulatory Visit: Payer: Medicare Other | Admitting: Occupational Therapy

## 2011-08-17 ENCOUNTER — Ambulatory Visit: Payer: Medicare Other | Admitting: Rehabilitative and Restorative Service Providers"

## 2011-08-21 ENCOUNTER — Encounter: Payer: Medicare Other | Attending: Physical Medicine & Rehabilitation

## 2011-08-21 ENCOUNTER — Ambulatory Visit (HOSPITAL_BASED_OUTPATIENT_CLINIC_OR_DEPARTMENT_OTHER): Payer: Medicare Other | Admitting: Physical Medicine & Rehabilitation

## 2011-08-21 DIAGNOSIS — R1310 Dysphagia, unspecified: Secondary | ICD-10-CM | POA: Insufficient documentation

## 2011-08-21 DIAGNOSIS — M25519 Pain in unspecified shoulder: Secondary | ICD-10-CM | POA: Insufficient documentation

## 2011-08-21 DIAGNOSIS — I69991 Dysphagia following unspecified cerebrovascular disease: Secondary | ICD-10-CM | POA: Insufficient documentation

## 2011-08-21 DIAGNOSIS — G8110 Spastic hemiplegia affecting unspecified side: Secondary | ICD-10-CM

## 2011-08-21 DIAGNOSIS — I69922 Dysarthria following unspecified cerebrovascular disease: Secondary | ICD-10-CM | POA: Insufficient documentation

## 2011-08-21 DIAGNOSIS — I69992 Facial weakness following unspecified cerebrovascular disease: Secondary | ICD-10-CM | POA: Insufficient documentation

## 2011-08-21 DIAGNOSIS — I69959 Hemiplegia and hemiparesis following unspecified cerebrovascular disease affecting unspecified side: Secondary | ICD-10-CM | POA: Insufficient documentation

## 2011-08-22 NOTE — Assessment & Plan Note (Signed)
REASON FOR VISIT:  Left lower extremity spasticity.  HISTORY:  A 65 year old male with embolic posterior cerebral artery infarct with right hemiparesis, right facial weakness, dysphagia, dysarthria, cognitive deficits, onset in January 2012.  He has had inpatient rehab until from February 15 to March 07, 2011.  Home with home health PT/OT.  Followed by Outpatient Therapy.  He has had increasing right lower extremity spasticity.  He is fortunately doing more ambulation with physical therapy, but this starts to getting clonus in the right lower extremity.  The patient states that his right shoulder feels tight as well and he has pain in the right shoulder with overhead activity.  He feels that over the last few days his right shoulder tightness is increasing.  He does not drive.  He does not climb steps.  He uses a wheelchair for further distances, ambulates with therapy mainly.  PHYSICAL EXAMINATION:  His right upper extremity has mildly positive impingement sign.  Some pain with external rotation of the shoulder, some increased tone at the pectoralis with external rotation of the arm. He does have grade 2 spasticity on the Ashworth modified scale in the right bicep.  He also has clonus at the finger and wrist flexors on the right side, but this is not sustained.  He does have about five beats of clonus in the right ankle plantar flexors.  He does have some increased tone in the right biceps and the right hamstrings as well.  He has 1-2+ edema on the right lower extremity at the pretibial area.  Mood and affect have lability.  There is some minimal dysarthria residual.  IMPRESSION: 1. His spastic right hemiplegia.  We discussed treatment options.  I     think that tizanidine 2 mg b.i.d. is good place to start on concern     about possible sedation effects, so we will start a fairly small     dose.  Certainly, he could take it at night.  It should cause some     daytime drowsiness  given that he has some nighttime discomfort     particularly in the upper extremity which is likely tone related. 2. Scheduled for right tibial nerve block with phenol in 2-3 weeks.     This assuming he does not get adequate relief from his Tizanidine. 3. Consider right pectoralis and biceps brachia botulinum toxin     injection. 4. Continue PT, OT outpatient.     Erick Colace, M.D. Electronically Signed    AEK/MedQ D:  08/21/2011 16:18:11  T:  08/21/2011 17:00:58  Job #:  [id number]  cc:   Mendel Corning [facility]

## 2011-08-29 ENCOUNTER — Ambulatory Visit: Payer: Medicare Other | Admitting: Occupational Therapy

## 2011-08-29 ENCOUNTER — Ambulatory Visit: Payer: Medicare Other | Attending: Internal Medicine | Admitting: Rehabilitative and Restorative Service Providers"

## 2011-08-29 DIAGNOSIS — R41842 Visuospatial deficit: Secondary | ICD-10-CM | POA: Insufficient documentation

## 2011-08-29 DIAGNOSIS — M2420 Disorder of ligament, unspecified site: Secondary | ICD-10-CM | POA: Insufficient documentation

## 2011-08-29 DIAGNOSIS — I69998 Other sequelae following unspecified cerebrovascular disease: Secondary | ICD-10-CM | POA: Insufficient documentation

## 2011-08-29 DIAGNOSIS — Z5189 Encounter for other specified aftercare: Secondary | ICD-10-CM | POA: Insufficient documentation

## 2011-08-29 DIAGNOSIS — M629 Disorder of muscle, unspecified: Secondary | ICD-10-CM | POA: Insufficient documentation

## 2011-08-29 DIAGNOSIS — R269 Unspecified abnormalities of gait and mobility: Secondary | ICD-10-CM | POA: Insufficient documentation

## 2011-08-29 DIAGNOSIS — M6281 Muscle weakness (generalized): Secondary | ICD-10-CM | POA: Insufficient documentation

## 2011-08-30 ENCOUNTER — Ambulatory Visit: Payer: Medicare Other | Admitting: Occupational Therapy

## 2011-08-30 ENCOUNTER — Ambulatory Visit: Payer: Medicare Other | Admitting: Physical Therapy

## 2011-09-03 ENCOUNTER — Ambulatory Visit: Payer: Medicare Other | Admitting: Occupational Therapy

## 2011-09-03 ENCOUNTER — Ambulatory Visit: Payer: Medicare Other | Admitting: Physical Therapy

## 2011-09-05 ENCOUNTER — Ambulatory Visit: Payer: Medicare Other | Admitting: Occupational Therapy

## 2011-09-05 ENCOUNTER — Ambulatory Visit: Payer: Medicare Other | Admitting: Rehabilitative and Restorative Service Providers"

## 2011-09-10 ENCOUNTER — Ambulatory Visit: Payer: Medicare Other | Admitting: Physical Therapy

## 2011-09-10 ENCOUNTER — Encounter: Payer: Medicare Other | Admitting: Occupational Therapy

## 2011-09-11 ENCOUNTER — Emergency Department (HOSPITAL_COMMUNITY): Payer: Medicare Other

## 2011-09-11 ENCOUNTER — Inpatient Hospital Stay (HOSPITAL_COMMUNITY)
Admission: EM | Admit: 2011-09-11 | Discharge: 2011-09-13 | DRG: 303 | Disposition: A | Discharge status: Home / Self Care | Payer: Medicare Other | Attending: Interventional Cardiology | Admitting: Interventional Cardiology

## 2011-09-11 DIAGNOSIS — R079 Chest pain, unspecified: Secondary | ICD-10-CM

## 2011-09-11 DIAGNOSIS — I2510 Atherosclerotic heart disease of native coronary artery without angina pectoris: Secondary | ICD-10-CM | POA: Diagnosis present

## 2011-09-11 DIAGNOSIS — E119 Type 2 diabetes mellitus without complications: Secondary | ICD-10-CM | POA: Diagnosis present

## 2011-09-11 DIAGNOSIS — M109 Gout, unspecified: Secondary | ICD-10-CM | POA: Diagnosis present

## 2011-09-11 DIAGNOSIS — E785 Hyperlipidemia, unspecified: Secondary | ICD-10-CM | POA: Diagnosis present

## 2011-09-11 DIAGNOSIS — I498 Other specified cardiac arrhythmias: Secondary | ICD-10-CM | POA: Diagnosis present

## 2011-09-11 DIAGNOSIS — E669 Obesity, unspecified: Secondary | ICD-10-CM | POA: Diagnosis present

## 2011-09-11 DIAGNOSIS — I10 Essential (primary) hypertension: Secondary | ICD-10-CM | POA: Diagnosis present

## 2011-09-11 DIAGNOSIS — Z79899 Other long term (current) drug therapy: Secondary | ICD-10-CM

## 2011-09-11 DIAGNOSIS — I739 Peripheral vascular disease, unspecified: Secondary | ICD-10-CM | POA: Diagnosis present

## 2011-09-11 DIAGNOSIS — I440 Atrioventricular block, first degree: Secondary | ICD-10-CM | POA: Diagnosis present

## 2011-09-11 DIAGNOSIS — Z87891 Personal history of nicotine dependence: Secondary | ICD-10-CM

## 2011-09-11 DIAGNOSIS — Z7902 Long term (current) use of antithrombotics/antiplatelets: Secondary | ICD-10-CM

## 2011-09-11 DIAGNOSIS — Z833 Family history of diabetes mellitus: Secondary | ICD-10-CM

## 2011-09-11 DIAGNOSIS — I209 Angina pectoris, unspecified: Secondary | ICD-10-CM | POA: Diagnosis present

## 2011-09-11 DIAGNOSIS — Z9861 Coronary angioplasty status: Secondary | ICD-10-CM

## 2011-09-11 DIAGNOSIS — Z8673 Personal history of transient ischemic attack (TIA), and cerebral infarction without residual deficits: Secondary | ICD-10-CM

## 2011-09-11 DIAGNOSIS — I2582 Chronic total occlusion of coronary artery: Secondary | ICD-10-CM | POA: Diagnosis present

## 2011-09-11 DIAGNOSIS — Z8249 Family history of ischemic heart disease and other diseases of the circulatory system: Secondary | ICD-10-CM

## 2011-09-11 DIAGNOSIS — I25810 Atherosclerosis of coronary artery bypass graft(s) without angina pectoris: Principal | ICD-10-CM | POA: Diagnosis present

## 2011-09-11 DIAGNOSIS — Z7982 Long term (current) use of aspirin: Secondary | ICD-10-CM

## 2011-09-11 LAB — POCT I-STAT TROPONIN I: Troponin i, poc: 0.01 ng/mL (ref 0.00–0.08)

## 2011-09-11 LAB — COMPREHENSIVE METABOLIC PANEL
ALT: 26 U/L (ref 0–53)
AST: 21 U/L (ref 0–37)
Albumin: 3.4 g/dL — ABNORMAL LOW (ref 3.5–5.2)
Alkaline Phosphatase: 69 U/L (ref 39–117)
Glucose, Bld: 120 mg/dL — ABNORMAL HIGH (ref 70–99)
Potassium: 4.2 mEq/L (ref 3.5–5.1)
Sodium: 138 mEq/L (ref 135–145)
Total Protein: 7.3 g/dL (ref 6.0–8.3)

## 2011-09-11 LAB — CBC
HCT: 43.6 % (ref 39.0–52.0)
Hemoglobin: 15.2 g/dL (ref 13.0–17.0)
MCHC: 34.9 g/dL (ref 30.0–36.0)
RBC: 4.83 MIL/uL (ref 4.22–5.81)

## 2011-09-12 ENCOUNTER — Inpatient Hospital Stay (HOSPITAL_COMMUNITY): Payer: Medicare Other

## 2011-09-12 ENCOUNTER — Ambulatory Visit: Payer: Medicare Other | Admitting: Rehabilitative and Restorative Service Providers"

## 2011-09-12 ENCOUNTER — Encounter: Payer: Medicare Other | Admitting: Occupational Therapy

## 2011-09-12 LAB — HEPARIN LEVEL (UNFRACTIONATED)
Heparin Unfractionated: 0.19 IU/mL — ABNORMAL LOW (ref 0.30–0.70)
Heparin Unfractionated: 0.23 IU/mL — ABNORMAL LOW (ref 0.30–0.70)

## 2011-09-12 LAB — CBC
Hemoglobin: 14.7 g/dL (ref 13.0–17.0)
Platelets: 280 10*3/uL (ref 150–400)
RBC: 4.85 MIL/uL (ref 4.22–5.81)
WBC: 4.8 10*3/uL (ref 4.0–10.5)

## 2011-09-12 LAB — GLUCOSE, CAPILLARY
Glucose-Capillary: 103 mg/dL — ABNORMAL HIGH (ref 70–99)
Glucose-Capillary: 115 mg/dL — ABNORMAL HIGH (ref 70–99)
Glucose-Capillary: 134 mg/dL — ABNORMAL HIGH (ref 70–99)
Glucose-Capillary: 76 mg/dL (ref 70–99)

## 2011-09-12 LAB — CARDIAC PANEL(CRET KIN+CKTOT+MB+TROPI)
Relative Index: 2.4 (ref 0.0–2.5)
Relative Index: 2.5 (ref 0.0–2.5)
Total CK: 141 U/L (ref 7–232)

## 2011-09-12 LAB — LIPID PANEL
Cholesterol: 149 mg/dL (ref 0–200)
HDL: 32 mg/dL — ABNORMAL LOW (ref >39)
Total CHOL/HDL Ratio: 4.7 RATIO

## 2011-09-12 LAB — TROPONIN I: Troponin I: <0.3 ng/mL (ref <0.30)

## 2011-09-12 LAB — CK TOTAL AND CKMB (NOT AT ARMC): Relative Index: 2.5 (ref 0.0–2.5)

## 2011-09-12 MED ORDER — TECHNETIUM TC 99M TETROFOSMIN IV KIT
30.0000 | PACK | Freq: Once | INTRAVENOUS | Status: AC | PRN
  Administered 2011-09-12: 30 via INTRAVENOUS

## 2011-09-12 MED ORDER — TECHNETIUM TC 99M TETROFOSMIN IV KIT
10.0000 | PACK | Freq: Once | INTRAVENOUS | Status: AC | PRN
  Administered 2011-09-12: 10 via INTRAVENOUS

## 2011-09-13 ENCOUNTER — Ambulatory Visit: Payer: Medicare Other | Admitting: Physical Medicine & Rehabilitation

## 2011-09-13 LAB — CBC
HCT: 43.8 % (ref 39.0–52.0)
Hemoglobin: 15.1 g/dL (ref 13.0–17.0)
MCH: 31.3 pg (ref 26.0–34.0)
MCHC: 34.5 g/dL (ref 30.0–36.0)
RDW: 13.3 % (ref 11.5–15.5)

## 2011-09-13 LAB — GLUCOSE, CAPILLARY
Glucose-Capillary: 118 mg/dL — ABNORMAL HIGH (ref 70–99)
Glucose-Capillary: 77 mg/dL (ref 70–99)

## 2011-09-17 ENCOUNTER — Ambulatory Visit: Payer: Medicare Other | Admitting: Physical Therapy

## 2011-09-20 ENCOUNTER — Ambulatory Visit: Payer: Medicare Other | Admitting: Physical Therapy

## 2011-09-25 ENCOUNTER — Ambulatory Visit: Payer: Medicare Other | Attending: Internal Medicine | Admitting: Rehabilitative and Restorative Service Providers"

## 2011-09-25 DIAGNOSIS — Z5189 Encounter for other specified aftercare: Secondary | ICD-10-CM | POA: Insufficient documentation

## 2011-09-25 DIAGNOSIS — M6281 Muscle weakness (generalized): Secondary | ICD-10-CM | POA: Insufficient documentation

## 2011-09-25 DIAGNOSIS — R269 Unspecified abnormalities of gait and mobility: Secondary | ICD-10-CM | POA: Insufficient documentation

## 2011-09-25 DIAGNOSIS — M2420 Disorder of ligament, unspecified site: Secondary | ICD-10-CM | POA: Insufficient documentation

## 2011-09-25 DIAGNOSIS — M629 Disorder of muscle, unspecified: Secondary | ICD-10-CM | POA: Insufficient documentation

## 2011-09-25 DIAGNOSIS — R41842 Visuospatial deficit: Secondary | ICD-10-CM | POA: Insufficient documentation

## 2011-09-25 DIAGNOSIS — I69998 Other sequelae following unspecified cerebrovascular disease: Secondary | ICD-10-CM | POA: Insufficient documentation

## 2011-09-27 ENCOUNTER — Ambulatory Visit: Payer: Medicare Other | Admitting: Rehabilitative and Restorative Service Providers"

## 2011-10-01 ENCOUNTER — Ambulatory Visit: Payer: Medicare Other | Admitting: Physical Therapy

## 2011-10-02 ENCOUNTER — Ambulatory Visit: Payer: Medicare Other | Admitting: Physical Therapy

## 2011-10-04 NOTE — Discharge Summary (Signed)
[name], [name] NO.:  [id number]  MEDICAL RECORD NO.:  [id number]  LOCATION:                                 FACILITY:  PHYSICIAN:  Lyn Records, M.D.   DATE OF BIRTH:  24-Aug-1946  DATE OF ADMISSION:  09/11/2011 DATE OF DISCHARGE:  09/13/2011                              DISCHARGE SUMMARY   REASON FOR ADMISSION:  Chest pain.  DISCHARGE DIAGNOSES: 1. Prolonged chest pain, likely due to angina without evidence of     myocardial infarctions.     a.     Nuclear stress test performed demonstrating inferior scar      with mild peri-infarct ischemia.  The scan is considered to be      relatively low risk. 2. Coronary atherosclerotic heart disease.     a.     Status post coronary artery bypass graft in 2011.     b.     Bypass graft failure documented by catheterization in      February 2012.     c.     Left anterior descending stent after identification of left      internal mammary artery occlusion in February 2012. 3. Embolic cerebrovascular accident to the posterior circulation     complicating the patient's catheterization and percutaneous     coronary intervention in February.     a.     Significant residual neurological deficits but with also      significant improvement since February via rehab. 4. Hypertension. 5. Diabetes. 6. Peripheral vascular disease.  PROCEDURES PERFORMED:  Lexiscan myocardial perfusion study on September 12, 2011.  DISCHARGE INSTRUCTIONS: 1. The patient is to call if recurrent angina. 2. He is to use sublingual nitroglycerin if pain. 3. He can resume physical therapy.  DISCHARGE MEDICATIONS: 1. Isosorbide mononitrate (new therapy) 60 mg 1/2 tablet per day for 1     week, then increased to 1 full tablet per day. 2. Allopurinol 100 mg each day. 3. Aspirin 325 mg per day. 4. Atenolol 50 mg per day. 5. Glucotrol 5 mg 1/2 tablet twice daily. 6. Lipitor 20 mg daily. 7. Nitroglycerin 0.4 mg sublingually as needed. 8.  Plavix 75 mg per day. 9. Tizanidine 2 mg twice daily.  ACTIVITY:  As tolerated.  DIET:  Carbohydrate-modified.  HISTORY AND PHYSICAL AND HOSPITAL COURSE:  The patient was admitted with a less than 20-minute episode of chest pain.  EKGs and serial markers did not reveal any evidence of infarction.  A nuclear perfusion study did not demonstrate any significant region of myocardial ischemia.  The patient has a known old inferior scar and he had mild peri-infarct ischemia.  His medications were adjusted.  Long-acting nitrates were added.  He tolerated the medications adequately.  His laboratory data was unremarkable.  His admitting creatinine was 1.16, potassium 4.2, hemoglobin 15.1, and white blood cell count 6500.  At discharge, his condition is improved.  We will continue to follow the patient and he is to call if he has any significant recurrences of angina.  Office visit with Dr. Katrinka Blazing is already been scheduled.     Lyn Records, M.D.     HWS/MEDQ  D:  09/13/2011  T:  09/13/2011  Job:  [id number]  Electronically Signed by Verdis Prime M.D. on 10/04/2011 11:00:58 AM

## 2011-10-07 NOTE — H&P (Signed)
[name], [name]            ACCOUNT NO.:  [id number]  MEDICAL RECORD NO.:  [id number]  LOCATION:                                 FACILITY:  PHYSICIAN:  Bevelyn Buckles. Maeson Lourenco, MDDATE OF BIRTH:  1946-03-10  DATE OF ADMISSION:  09/11/2011 DATE OF DISCHARGE:                             HISTORY & PHYSICAL   PRIMARY CARDIOLOGIST:  Lyn Records, MD  PRIMARY CARE PHYSICIAN:  Georgann Housekeeper, MD  [name] is a 65 year old man with history of hypertension, diabetes, peripheral arterial disease, and coronary artery disease status post bypass surgery in May 2011 by Dr. Dorris Fetch.  He was admitted in February 2012 with unstable angina.  His cardiac enzymes were normal. He underwent cardiac catheterization by Dr. Katrinka Blazing, which showed an EF of 45%.  The left main had a 50% stenosis.  The LAD had an 80% proximal stenosis and 90% distal stenosis.  The first diagonal had tandem 90% lesions.  The left circumflex had a 99% proximal lesion.  The RCA had a 99% mid lesion.  The LIMA to the LAD was totally occluded in the midsection.  The saphenous vein graft to the diagonal was totally occluded.  The saphenous vein graft to the RCA had a 70% mid lesion. The saphenous vein graft to the OM was widely patent.  He was treated with a Promus Element drug-eluting stent to the LAD and a cutting balloon angioplasty of the first diagonal.  Unfortunately, his cardiac catheterization was complicated by a large posterior circulation stroke. MRI of the brain showed daily infarct to the left PCA and small infarcts in the left cerebellum, bilateral thalami, and right occipital lobes. MRI of the brain showed severe basilar artery stenosis and cerebral angiogram showed a large basal artery occlusion and clot.  He was transferred to Inpatient Rehab and worked intensively with Dr. Wynn Banker.  He also had a PEG tube placed.  Fortunately, he has been doing much better.  He has been going to Rehab and starting  to walk again.  He has a chest pain about once a week with just last a few minutes.  This can come on any time, not necessary associated with exertion that has been very stable pattern.  Tonight, he went to bed about 5:30 or 6 o'clock to lie down for a little while.  He was feeling fine.  At about 7 o'clock, a friend came over and he got up to talk to him and he was a bit frustrated and he started developing angina.  He took one nitroglycerin.  The pain started to get better.  He called 911.  When they got there, the pain was essentially resolved, but he was told to take a second nitroglycerin.  He was brought to the emergency room and his first set of cardiac markers were normal.  EKG showed mild PR depression inferiorly with new Q-waves in V4-V6.  He is currently chest pain free.  He has been taking his Plavix every day without missing.  REVIEW OF SYSTEMS:  His main complaint is spasms on his right side.  He has not had any palpitations.  No heart failure symptoms.  No bleeding. He continues to be somewhat weak  on the right and has pain there as well.  The remainder of the review of systems is negative except for HPI and problem list.  PROBLEM LIST: 1. Coronary artery disease as described above. 2. History of a large stroke, status post catheterization in February     2012 complicated by right spastic hemiparesis. 3. Peripheral arterial disease.     a.     Status post left femoral-popliteal bypass.     b.     Also status post endarterectomy of the left common femoral      and profunda femoris arteries. 4. Hypertension. 5. Diabetes. 6. Hyperlipidemia. 7. Gout. 8. Osteoarthritis.  CURRENT MEDICATIONS: 1. Tizanidine 2 mg p.o. b.i.d. 2. Nitroglycerin sublingual p.r.n. 3. Lipitor 20 mg a day. 4. Glipizide 2.5 mg b.i.d. 5. Plavix 75 mg a day. 6. Atenolol 25 mg b.i.d. 7. Aspirin 325 a day. 8. Allopurinol 100 mg a day.  ALLERGIES:  No known allergies.  SOCIAL HISTORY:  He  lives in [place] with his wife.  He owns a business.  He used to smoke, but quit about almost 20 years ago.  Does not drink any alcohol.  FAMILY HISTORY:  Notable for diabetes and coronary artery disease.  His father had coronary artery disease in his 50s.  PHYSICAL EXAM:  GENERAL:  He is sitting in bed in no acute distress. VITAL SIGNS:  Blood pressure 120/70, heart rate 61.  He is saturating 98% on 2 liters. HEENT:  Notable for right facial droop, otherwise normal. NECK:  Supple.  No JVD.  Carotids are 2+ bilaterally without bruits. There is no lymphadenopathy or thyromegaly. CARDIAC:  PMI is nondisplaced.  He is regular with no murmurs. LUNGS:  Clear. ABDOMEN:  Obese, nontender, good bowel sounds. EXTREMITIES:  Warm with no cyanosis or clubbing.  H is weak on the right side.  He has some mild edema in the right lower extremity, otherwise normal. NEURO:  Alert and oriented x3.  Cranial nerves are intact except for the right facial droop.  He is weak on the right side as above.  Affect is very pleasant.  EKG shows sinus rhythm.  There is sinus bradycardia rate of 58 with a first-degree AV block.  He has Q-waves in V1-V6 as well as inferiorly. There is mild PR depression inferiorly with mild tugging-up of the ST segments inferiorly.  The Q-waves in V4-V6 are new from February. Otherwise, no significant change.  White count is 5.7, hemoglobin 15.2, and platelet of 310.  Sodium 138, potassium 4.2, BUN 19, and creatinine 1.16.  Troponin was 0.01.  Chest x-ray, no acute process.  ASSESSMENT: 1. Chest pain concerning for unstable angina. 2. Coronary artery disease, status post bypass surgery in May 2011.     a.     Status post percutaneous intervention on the left anterior      descending and first diagonal in February 2012 as described above. 3. History of cerebrovascular accident, status post cardiac     catheterization in February 2012 with right spastic hemiparesis. 4.  Peripheral arterial disease. 5. Diabetes.  PLAN/DISCUSSION:  [name] chest pain is concerning for unstable angina.  EKG is nonacute, but does have new Q-waves in V4-V6 which correlate with his previous angioplasty.  I think at this point he needs to be admitted and treated with aspirin, beta-blocker, heparin, and Plavix.  We will cycle cardiac markers.  Given his previous stroke, I would have a very high threshold and cath him.  If his cardiac  markers are negative and he does not have recurrent pain, I would consider a Myoview.  If his cardiac enzymes are positive or recurrent chest pain, he will need a cath.  I will have Dr. Katrinka Blazing see him in the morning.     Bevelyn Buckles. Aydan Levitz, MD     DRB/MEDQ  D:  09/11/2011  T:  09/12/2011  Job:  [id number]  cc:   Lyn Records, M.D. Georgann Housekeeper, MD Erick Colace, M.D.  Electronically Signed by Arvilla Meres MD on 10/07/2011 02:44:15 PM

## 2011-10-08 ENCOUNTER — Ambulatory Visit: Payer: Medicare Other | Admitting: Physical Therapy

## 2011-10-11 ENCOUNTER — Ambulatory Visit: Payer: Medicare Other | Admitting: Physical Therapy

## 2011-10-15 ENCOUNTER — Ambulatory Visit: Payer: Medicare Other | Admitting: Physical Therapy

## 2011-10-18 ENCOUNTER — Ambulatory Visit: Payer: Medicare Other | Admitting: Physical Therapy

## 2011-10-22 ENCOUNTER — Ambulatory Visit: Payer: Medicare Other | Admitting: Physical Therapy

## 2011-10-25 ENCOUNTER — Ambulatory Visit: Payer: Medicare Other | Attending: Internal Medicine | Admitting: Physical Therapy

## 2011-10-25 DIAGNOSIS — M6281 Muscle weakness (generalized): Secondary | ICD-10-CM | POA: Insufficient documentation

## 2011-10-25 DIAGNOSIS — M2420 Disorder of ligament, unspecified site: Secondary | ICD-10-CM | POA: Insufficient documentation

## 2011-10-25 DIAGNOSIS — R41842 Visuospatial deficit: Secondary | ICD-10-CM | POA: Insufficient documentation

## 2011-10-25 DIAGNOSIS — R269 Unspecified abnormalities of gait and mobility: Secondary | ICD-10-CM | POA: Insufficient documentation

## 2011-10-25 DIAGNOSIS — M629 Disorder of muscle, unspecified: Secondary | ICD-10-CM | POA: Insufficient documentation

## 2011-10-25 DIAGNOSIS — Z5189 Encounter for other specified aftercare: Secondary | ICD-10-CM | POA: Insufficient documentation

## 2011-10-25 DIAGNOSIS — I69998 Other sequelae following unspecified cerebrovascular disease: Secondary | ICD-10-CM | POA: Insufficient documentation

## 2011-10-29 ENCOUNTER — Ambulatory Visit: Payer: Medicare Other | Admitting: Physical Therapy

## 2011-10-30 ENCOUNTER — Ambulatory Visit: Payer: Medicare Other | Admitting: Physical Therapy

## 2011-11-01 ENCOUNTER — Ambulatory Visit: Payer: Medicare Other | Admitting: Physical Therapy

## 2011-11-08 ENCOUNTER — Ambulatory Visit (HOSPITAL_BASED_OUTPATIENT_CLINIC_OR_DEPARTMENT_OTHER): Payer: Medicare Other | Admitting: Physical Medicine & Rehabilitation

## 2011-11-08 ENCOUNTER — Encounter: Payer: Medicare Other | Attending: Physical Medicine & Rehabilitation

## 2011-11-08 DIAGNOSIS — I69959 Hemiplegia and hemiparesis following unspecified cerebrovascular disease affecting unspecified side: Secondary | ICD-10-CM | POA: Insufficient documentation

## 2011-11-08 DIAGNOSIS — I69991 Dysphagia following unspecified cerebrovascular disease: Secondary | ICD-10-CM | POA: Insufficient documentation

## 2011-11-08 DIAGNOSIS — R1310 Dysphagia, unspecified: Secondary | ICD-10-CM | POA: Insufficient documentation

## 2011-11-08 DIAGNOSIS — G8110 Spastic hemiplegia affecting unspecified side: Secondary | ICD-10-CM

## 2011-11-08 DIAGNOSIS — M25519 Pain in unspecified shoulder: Secondary | ICD-10-CM | POA: Insufficient documentation

## 2011-11-08 DIAGNOSIS — I69922 Dysarthria following unspecified cerebrovascular disease: Secondary | ICD-10-CM | POA: Insufficient documentation

## 2011-11-08 DIAGNOSIS — I69992 Facial weakness following unspecified cerebrovascular disease: Secondary | ICD-10-CM | POA: Insufficient documentation

## 2011-11-09 NOTE — Assessment & Plan Note (Signed)
REASON FOR VISIT:  Right hemiparesis due to CVA.  HISTORY:  A 65 year old male with embolic posterior cerebral artery infarct, right hemiparesis, right facial weakness.  He had a pontine stroke as well.  He went through Inpatient Rehab February 15 through March 07, 2011.  He went home with home health therapy, then followed by outpatient therapy.  He was having problems with spasticity with clonus at the right ankle on August 21, 2011 when I saw him.  He was set up for tibial nerve block but feels like he is overall doing better.  We kept him on tizanidine 2 mg b.i.d. since that visit and he thinks that may have helped.  He has had no new medical problems in the interval time period.  Please see health and history form for additional information.  PHYSICAL EXAMINATION:  VITAL SIGNS:  Blood pressure 131/66, pulse 66, respirations 18, O2 saturation 95% on room air, weight 275 pounds, height 5 feet 2-1/2 inches. GENERAL:  No acute distress.  Mood and affect appropriate. EXTREMITIES:  His upper extremity strength on the right side is 3- at the deltoid and biceps, and triceps, 4- at the grip.  He does have increased tone in the right upper extremity.  In the right lower extremity, he has 4- in the hip flexion, knee extension, and 1 at the ankle dorsiflexion.  He does have clonus at the right ankle, although it is only 5 beats and extinguishes.  IMPRESSION:  Right spastic hemiplegia.  Overall doing better with additional therapy as well as addition of tizanidine.  We will hold off any type of injections at the current time.  I will see him back in 3 months.  I will be about 1 year post stroke.  Continue his outpatient therapy.  Discussed with the patient and his wife about the plan.     Erick Colace, M.D. Electronically Signed    AEK/MedQ D:  11/08/2011 11:02:12  T:  11/08/2011 11:42:33  Job #:  [id number]

## 2011-12-27 ENCOUNTER — Encounter (INDEPENDENT_AMBULATORY_CARE_PROVIDER_SITE_OTHER): Payer: Medicare Other | Admitting: *Deleted

## 2011-12-27 DIAGNOSIS — I70409 Unspecified atherosclerosis of autologous vein bypass graft(s) of the extremities, unspecified extremity: Secondary | ICD-10-CM

## 2011-12-27 DIAGNOSIS — Z48812 Encounter for surgical aftercare following surgery on the circulatory system: Secondary | ICD-10-CM

## 2011-12-27 DIAGNOSIS — I739 Peripheral vascular disease, unspecified: Secondary | ICD-10-CM

## 2011-12-31 NOTE — Procedures (Unsigned)
BYPASS GRAFT EVALUATION  INDICATION:  Followup lower extremity bypass graft.  HISTORY: Diabetes:  Yes. Cardiac:  Yes. Hypertension:  Yes. Smoking:  Previously Previous Surgery:  Left femoropopliteal graft 09/22/2010.  SINGLE LEVEL ARTERIAL EXAM                              RIGHT              LEFT Brachial: Anterior tibial: Posterior tibial: Peroneal: Ankle/brachial index:  PREVIOUS ABI:  Date: 06/21/2011  RIGHT:  1.03  LEFT:  1.00  LOWER EXTREMITY BYPASS GRAFT DUPLEX EXAM:  DUPLEX:  Patent left femoral to above-knee popliteal bypass graft with triphasic waveforms throughout.  IMPRESSION: 1. Patent left femoral to above-knee popliteal bypass graft. 2. Stable ankle-brachial indices.  ___________________________________________ V. Charlena Cross, MD  SS/MEDQ  D:  12/27/2011  T:  12/27/2011  Job:  [id number]

## 2012-02-07 ENCOUNTER — Ambulatory Visit: Payer: Medicare Other | Admitting: Physical Medicine & Rehabilitation

## 2012-02-07 ENCOUNTER — Encounter: Payer: Medicare Other | Attending: Physical Medicine & Rehabilitation

## 2012-02-07 DIAGNOSIS — M25519 Pain in unspecified shoulder: Secondary | ICD-10-CM | POA: Insufficient documentation

## 2012-02-07 DIAGNOSIS — I69922 Dysarthria following unspecified cerebrovascular disease: Secondary | ICD-10-CM | POA: Insufficient documentation

## 2012-02-07 DIAGNOSIS — R1310 Dysphagia, unspecified: Secondary | ICD-10-CM | POA: Insufficient documentation

## 2012-02-07 DIAGNOSIS — I69991 Dysphagia following unspecified cerebrovascular disease: Secondary | ICD-10-CM | POA: Insufficient documentation

## 2012-02-07 DIAGNOSIS — I69959 Hemiplegia and hemiparesis following unspecified cerebrovascular disease affecting unspecified side: Secondary | ICD-10-CM | POA: Insufficient documentation

## 2012-02-07 DIAGNOSIS — G8110 Spastic hemiplegia affecting unspecified side: Secondary | ICD-10-CM

## 2012-02-07 DIAGNOSIS — I69992 Facial weakness following unspecified cerebrovascular disease: Secondary | ICD-10-CM | POA: Insufficient documentation

## 2012-02-08 NOTE — Assessment & Plan Note (Signed)
REASON FOR VISIT:  The patient would like to discuss options for right upper extremity, spasms, as well as right lower extremity spasms.  HISTORY:  A 66 year old male with prior history of coronary artery disease, status post CABG, May 2011.  He had a periprocedural left PCA distribution infarct causing infarcts in the left cerebellum, bilateral thalamic, as well as right occipital lobe, so this may have been more of a basilar artery stenosis related problem.  He has been on Plavix.  No recurrence.  No seizures.  No falls.  He walks with a cane and right AFO.  He uses a modified Van.  His spasticity has been bothering him more recently.  We discussed Botox as a possible treatment alternative for his right upper extremity as well as a tibial nerve block for his right lower extremity.  He really wanted to hold off on this, try other things.  We kept him on tizanidine 2 mg b.i.d., however, on his own, he just decreased it to once a day.  He has right lower extremity ankle spasticity going up his ramp up to his home. He also has spasms in his right arm.  PHYSICAL EXAMINATION:  MUSCULOSKELETAL:  He has 3- deltoid biceps, triceps, and 4- at the grip.  He has increased tone in the right upper extremity.  Ashworth grade 3 at the biceps and 1 at the wrist and finger flexors.  In the lower extremity, he has 4- hip flexion, knee extension, and only 1 at the ankle dorsiflexion in terms of strength.  In terms of his tone, he has Ashworth grade 3 at the hamstrings as well as the ankle with assisting clonus.  IMPRESSION:  Right spastic hemiplegia.  He has backed off on his tizanidine and I think he needs to go back up on it.  His spasms limit his walking up the ramp to his home as well as the functional use of his right upper extremity.  He has problems straightening out his elbow and otherwise is able to reach.  PLAN:  I would recommend Botox to his right biceps.  He would like to think about  this.  He asked me whether aquatic exercise might be helpful performing certainly would help him overall with conditioning, although I am not convinced that it is going to help on that much with his spasticity.  I reiterated my recommendations for b.i.d. tizanidine.  A long discussion with the patient and his wife going over treatment options including phenol neurolysis to the right musculocutaneous nerve. They would have to think about all these recommendations.  They are also going to follow up with me in 4 months, regardless but if he decides on having an injection, we can see him sooner.  Discussed with the patient and wife, agrees with plan.     Erick Colace, M.D. Electronically Signed    AEK/MedQ D:  02/07/2012 10:55:01  T:  02/07/2012 22:39:41  Job #:  [id number]  cc:   Lyn Records, M.D. Fax: [phone]  Pramod P. Pearlean Brownie, MD Fax: [phone]

## 2012-02-14 ENCOUNTER — Telehealth: Payer: Self-pay | Admitting: *Deleted

## 2012-02-14 DIAGNOSIS — E119 Type 2 diabetes mellitus without complications: Secondary | ICD-10-CM | POA: Diagnosis not present

## 2012-02-14 DIAGNOSIS — E785 Hyperlipidemia, unspecified: Secondary | ICD-10-CM | POA: Diagnosis not present

## 2012-02-14 DIAGNOSIS — L0390 Cellulitis, unspecified: Secondary | ICD-10-CM | POA: Diagnosis not present

## 2012-02-14 DIAGNOSIS — L0291 Cutaneous abscess, unspecified: Secondary | ICD-10-CM | POA: Diagnosis not present

## 2012-02-14 DIAGNOSIS — I10 Essential (primary) hypertension: Secondary | ICD-10-CM | POA: Diagnosis not present

## 2012-02-14 DIAGNOSIS — Z1331 Encounter for screening for depression: Secondary | ICD-10-CM | POA: Diagnosis not present

## 2012-02-14 NOTE — Telephone Encounter (Signed)
Patient called in and left a message that his foot was cold. He left a phone# of [phone] and both [name] and I tried to reach the patient by that number numerous times this afternoon. There was no voice mail on that phone and I finally found a cell phone # and left [name] a message to go to the ED asap so they could assess his perfusion in that leg and foot.

## 2012-02-16 ENCOUNTER — Inpatient Hospital Stay (HOSPITAL_COMMUNITY)
Admission: EM | Admit: 2012-02-16 | Discharge: 2012-02-20 | DRG: 603 | Disposition: A | Discharge status: Home Health | Payer: Medicare Other | Attending: Internal Medicine | Admitting: Internal Medicine

## 2012-02-16 ENCOUNTER — Encounter (HOSPITAL_COMMUNITY): Payer: Self-pay | Admitting: *Deleted

## 2012-02-16 DIAGNOSIS — Z7982 Long term (current) use of aspirin: Secondary | ICD-10-CM

## 2012-02-16 DIAGNOSIS — I129 Hypertensive chronic kidney disease with stage 1 through stage 4 chronic kidney disease, or unspecified chronic kidney disease: Secondary | ICD-10-CM | POA: Diagnosis present

## 2012-02-16 DIAGNOSIS — Z79899 Other long term (current) drug therapy: Secondary | ICD-10-CM

## 2012-02-16 DIAGNOSIS — I69359 Hemiplegia and hemiparesis following cerebral infarction affecting unspecified side: Secondary | ICD-10-CM

## 2012-02-16 DIAGNOSIS — L03119 Cellulitis of unspecified part of limb: Principal | ICD-10-CM | POA: Diagnosis present

## 2012-02-16 DIAGNOSIS — L02419 Cutaneous abscess of limb, unspecified: Principal | ICD-10-CM | POA: Diagnosis present

## 2012-02-16 DIAGNOSIS — I10 Essential (primary) hypertension: Secondary | ICD-10-CM | POA: Diagnosis present

## 2012-02-16 DIAGNOSIS — N189 Chronic kidney disease, unspecified: Secondary | ICD-10-CM | POA: Diagnosis present

## 2012-02-16 DIAGNOSIS — I252 Old myocardial infarction: Secondary | ICD-10-CM

## 2012-02-16 DIAGNOSIS — I69959 Hemiplegia and hemiparesis following unspecified cerebrovascular disease affecting unspecified side: Secondary | ICD-10-CM

## 2012-02-16 DIAGNOSIS — I2510 Atherosclerotic heart disease of native coronary artery without angina pectoris: Secondary | ICD-10-CM | POA: Diagnosis present

## 2012-02-16 DIAGNOSIS — M62838 Other muscle spasm: Secondary | ICD-10-CM | POA: Diagnosis present

## 2012-02-16 DIAGNOSIS — Z7902 Long term (current) use of antithrombotics/antiplatelets: Secondary | ICD-10-CM

## 2012-02-16 DIAGNOSIS — M109 Gout, unspecified: Secondary | ICD-10-CM | POA: Diagnosis present

## 2012-02-16 DIAGNOSIS — E1139 Type 2 diabetes mellitus with other diabetic ophthalmic complication: Secondary | ICD-10-CM | POA: Diagnosis present

## 2012-02-16 DIAGNOSIS — E119 Type 2 diabetes mellitus without complications: Secondary | ICD-10-CM | POA: Diagnosis present

## 2012-02-16 DIAGNOSIS — Z87891 Personal history of nicotine dependence: Secondary | ICD-10-CM

## 2012-02-16 DIAGNOSIS — I25810 Atherosclerosis of coronary artery bypass graft(s) without angina pectoris: Secondary | ICD-10-CM

## 2012-02-16 DIAGNOSIS — Z23 Encounter for immunization: Secondary | ICD-10-CM

## 2012-02-16 DIAGNOSIS — I872 Venous insufficiency (chronic) (peripheral): Secondary | ICD-10-CM

## 2012-02-16 DIAGNOSIS — L0390 Cellulitis, unspecified: Secondary | ICD-10-CM

## 2012-02-16 DIAGNOSIS — Z951 Presence of aortocoronary bypass graft: Secondary | ICD-10-CM

## 2012-02-16 HISTORY — DX: Peripheral vascular disease, unspecified: I73.9

## 2012-02-16 HISTORY — DX: Cerebral infarction, unspecified: I63.9

## 2012-02-16 HISTORY — DX: Acute myocardial infarction, unspecified: I21.9

## 2012-02-16 HISTORY — DX: Atherosclerotic heart disease of native coronary artery without angina pectoris: I25.10

## 2012-02-16 NOTE — ED Notes (Signed)
Pt is here for worsening right leg pain and swelling.  Pt has cellulitis and is on 2 antibiotics po at home.  Pain has been increasing and swelling has been increasing and pt has had chills.

## 2012-02-17 ENCOUNTER — Encounter (HOSPITAL_COMMUNITY): Payer: Self-pay | Admitting: Internal Medicine

## 2012-02-17 DIAGNOSIS — Z7902 Long term (current) use of antithrombotics/antiplatelets: Secondary | ICD-10-CM | POA: Diagnosis not present

## 2012-02-17 DIAGNOSIS — I129 Hypertensive chronic kidney disease with stage 1 through stage 4 chronic kidney disease, or unspecified chronic kidney disease: Secondary | ICD-10-CM | POA: Diagnosis present

## 2012-02-17 DIAGNOSIS — M7989 Other specified soft tissue disorders: Secondary | ICD-10-CM | POA: Diagnosis not present

## 2012-02-17 DIAGNOSIS — Z23 Encounter for immunization: Secondary | ICD-10-CM | POA: Diagnosis not present

## 2012-02-17 DIAGNOSIS — N189 Chronic kidney disease, unspecified: Secondary | ICD-10-CM | POA: Diagnosis present

## 2012-02-17 DIAGNOSIS — E109 Type 1 diabetes mellitus without complications: Secondary | ICD-10-CM | POA: Diagnosis not present

## 2012-02-17 DIAGNOSIS — M109 Gout, unspecified: Secondary | ICD-10-CM | POA: Diagnosis present

## 2012-02-17 DIAGNOSIS — L0390 Cellulitis, unspecified: Secondary | ICD-10-CM | POA: Diagnosis present

## 2012-02-17 DIAGNOSIS — E1139 Type 2 diabetes mellitus with other diabetic ophthalmic complication: Secondary | ICD-10-CM | POA: Diagnosis present

## 2012-02-17 DIAGNOSIS — I69959 Hemiplegia and hemiparesis following unspecified cerebrovascular disease affecting unspecified side: Secondary | ICD-10-CM | POA: Diagnosis not present

## 2012-02-17 DIAGNOSIS — I252 Old myocardial infarction: Secondary | ICD-10-CM | POA: Diagnosis not present

## 2012-02-17 DIAGNOSIS — Z79899 Other long term (current) drug therapy: Secondary | ICD-10-CM | POA: Diagnosis not present

## 2012-02-17 DIAGNOSIS — I872 Venous insufficiency (chronic) (peripheral): Secondary | ICD-10-CM | POA: Diagnosis not present

## 2012-02-17 DIAGNOSIS — Z87891 Personal history of nicotine dependence: Secondary | ICD-10-CM | POA: Diagnosis not present

## 2012-02-17 DIAGNOSIS — Z951 Presence of aortocoronary bypass graft: Secondary | ICD-10-CM | POA: Diagnosis not present

## 2012-02-17 DIAGNOSIS — M62838 Other muscle spasm: Secondary | ICD-10-CM | POA: Diagnosis present

## 2012-02-17 DIAGNOSIS — Z7982 Long term (current) use of aspirin: Secondary | ICD-10-CM | POA: Diagnosis not present

## 2012-02-17 DIAGNOSIS — N181 Chronic kidney disease, stage 1: Secondary | ICD-10-CM | POA: Diagnosis not present

## 2012-02-17 DIAGNOSIS — L02419 Cutaneous abscess of limb, unspecified: Secondary | ICD-10-CM | POA: Diagnosis not present

## 2012-02-17 DIAGNOSIS — M79609 Pain in unspecified limb: Secondary | ICD-10-CM | POA: Diagnosis not present

## 2012-02-17 DIAGNOSIS — I10 Essential (primary) hypertension: Secondary | ICD-10-CM | POA: Diagnosis not present

## 2012-02-17 DIAGNOSIS — L0291 Cutaneous abscess, unspecified: Secondary | ICD-10-CM | POA: Diagnosis not present

## 2012-02-17 DIAGNOSIS — E119 Type 2 diabetes mellitus without complications: Secondary | ICD-10-CM | POA: Diagnosis not present

## 2012-02-17 DIAGNOSIS — I2510 Atherosclerotic heart disease of native coronary artery without angina pectoris: Secondary | ICD-10-CM | POA: Diagnosis not present

## 2012-02-17 LAB — BASIC METABOLIC PANEL
BUN: 17 mg/dL (ref 6–23)
Calcium: 9.8 mg/dL (ref 8.4–10.5)
Chloride: 101 mEq/L (ref 96–112)
Creatinine, Ser: 1.5 mg/dL — ABNORMAL HIGH (ref 0.50–1.35)
GFR calc Af Amer: 54 mL/min — ABNORMAL LOW (ref >90)
GFR calc non Af Amer: 47 mL/min — ABNORMAL LOW (ref >90)

## 2012-02-17 LAB — GLUCOSE, CAPILLARY
Glucose-Capillary: 146 mg/dL — ABNORMAL HIGH (ref 70–99)
Glucose-Capillary: 160 mg/dL — ABNORMAL HIGH (ref 70–99)

## 2012-02-17 LAB — CBC
HCT: 42.7 % (ref 39.0–52.0)
HCT: 46.8 % (ref 39.0–52.0)
Hemoglobin: 13.5 g/dL (ref 13.0–17.0)
Hemoglobin: 15.7 g/dL (ref 13.0–17.0)
MCHC: 31.6 g/dL (ref 30.0–36.0)
MCHC: 33.5 g/dL (ref 30.0–36.0)
MCV: 91.4 fL (ref 78.0–100.0)
WBC: 10.1 10*3/uL (ref 4.0–10.5)

## 2012-02-17 LAB — CREATININE, SERUM
GFR calc Af Amer: 72 mL/min — ABNORMAL LOW (ref >90)
GFR calc non Af Amer: 62 mL/min — ABNORMAL LOW (ref >90)

## 2012-02-17 MED ORDER — INSULIN ASPART 100 UNIT/ML ~~LOC~~ SOLN
0.0000 [IU] | Freq: Every day | SUBCUTANEOUS | Status: DC
  Filled 2012-02-17: qty 3

## 2012-02-17 MED ORDER — ISOSORBIDE MONONITRATE ER 30 MG PO TB24
30.0000 mg | ORAL_TABLET | Freq: Two times a day (BID) | ORAL | Status: DC
  Administered 2012-02-17 – 2012-02-20 (×7): 30 mg via ORAL
  Filled 2012-02-17 (×9): qty 1

## 2012-02-17 MED ORDER — VANCOMYCIN HCL IN DEXTROSE 1-5 GM/200ML-% IV SOLN
1000.0000 mg | Freq: Once | INTRAVENOUS | Status: AC
  Administered 2012-02-17: 1000 mg via INTRAVENOUS
  Filled 2012-02-17: qty 200

## 2012-02-17 MED ORDER — SIMVASTATIN 40 MG PO TABS
40.0000 mg | ORAL_TABLET | Freq: Every day | ORAL | Status: DC
  Administered 2012-02-17: 40 mg via ORAL
  Filled 2012-02-17 (×2): qty 1

## 2012-02-17 MED ORDER — ALLOPURINOL 100 MG PO TABS
100.0000 mg | ORAL_TABLET | Freq: Every day | ORAL | Status: DC
  Administered 2012-02-17 – 2012-02-20 (×4): 100 mg via ORAL
  Filled 2012-02-17 (×4): qty 1

## 2012-02-17 MED ORDER — SODIUM CHLORIDE 0.9 % IV SOLN: 250.0000 mL | INTRAVENOUS | Status: DC | PRN

## 2012-02-17 MED ORDER — SODIUM CHLORIDE 0.9 % IJ SOLN
3.0000 mL | Freq: Two times a day (BID) | INTRAMUSCULAR | Status: DC
  Administered 2012-02-18 – 2012-02-19 (×4): 3 mL via INTRAVENOUS

## 2012-02-17 MED ORDER — ACETAMINOPHEN 325 MG PO TABS: 650.0000 mg | ORAL_TABLET | Freq: Four times a day (QID) | ORAL | Status: DC | PRN

## 2012-02-17 MED ORDER — INSULIN ASPART 100 UNIT/ML ~~LOC~~ SOLN
0.0000 [IU] | Freq: Three times a day (TID) | SUBCUTANEOUS | Status: DC
  Administered 2012-02-17: 3 [IU] via SUBCUTANEOUS
  Administered 2012-02-17 – 2012-02-20 (×2): 2 [IU] via SUBCUTANEOUS

## 2012-02-17 MED ORDER — SODIUM CHLORIDE 0.9 % IJ SOLN: 3.0000 mL | INTRAMUSCULAR | Status: DC | PRN

## 2012-02-17 MED ORDER — GLIPIZIDE 2.5 MG HALF TABLET
2.5000 mg | ORAL_TABLET | Freq: Two times a day (BID) | ORAL | Status: DC
  Administered 2012-02-17 – 2012-02-20 (×7): 2.5 mg via ORAL
  Filled 2012-02-17 (×9): qty 1

## 2012-02-17 MED ORDER — ATENOLOL 25 MG PO TABS
25.0000 mg | ORAL_TABLET | Freq: Two times a day (BID) | ORAL | Status: DC
  Administered 2012-02-17 – 2012-02-20 (×7): 25 mg via ORAL
  Filled 2012-02-17 (×8): qty 1

## 2012-02-17 MED ORDER — INFLUENZA VIRUS VACC SPLIT PF IM SUSP
0.5000 mL | INTRAMUSCULAR | Status: AC
  Filled 2012-02-17: qty 0.5

## 2012-02-17 MED ORDER — VANCOMYCIN HCL 1000 MG IV SOLR
1250.0000 mg | Freq: Two times a day (BID) | INTRAVENOUS | Status: DC
  Administered 2012-02-17 – 2012-02-20 (×6): 1250 mg via INTRAVENOUS
  Filled 2012-02-17 (×8): qty 1250

## 2012-02-17 MED ORDER — ENOXAPARIN SODIUM 40 MG/0.4ML ~~LOC~~ SOLN
40.0000 mg | Freq: Every day | SUBCUTANEOUS | Status: DC
  Administered 2012-02-17 – 2012-02-19 (×3): 40 mg via SUBCUTANEOUS
  Filled 2012-02-17 (×4): qty 0.4

## 2012-02-17 MED ORDER — GUAIFENESIN-DM 100-10 MG/5ML PO SYRP
5.0000 mL | ORAL_SOLUTION | ORAL | Status: DC | PRN
  Filled 2012-02-17: qty 5

## 2012-02-17 MED ORDER — VANCOMYCIN HCL 1000 MG IV SOLR
1500.0000 mg | Freq: Once | INTRAVENOUS | Status: AC
  Administered 2012-02-17: 1500 mg via INTRAVENOUS
  Filled 2012-02-17: qty 1500

## 2012-02-17 MED ORDER — PNEUMOCOCCAL VAC POLYVALENT 25 MCG/0.5ML IJ INJ
0.5000 mL | INJECTION | INTRAMUSCULAR | Status: AC
  Filled 2012-02-17: qty 0.5

## 2012-02-17 MED ORDER — PIPERACILLIN-TAZOBACTAM 3.375 G IVPB
3.3750 g | Freq: Three times a day (TID) | INTRAVENOUS | Status: DC
  Administered 2012-02-17 – 2012-02-20 (×10): 3.375 g via INTRAVENOUS
  Filled 2012-02-17 (×13): qty 50

## 2012-02-17 MED ORDER — ONDANSETRON HCL 4 MG PO TABS: 4.0000 mg | ORAL_TABLET | Freq: Four times a day (QID) | ORAL | Status: DC | PRN

## 2012-02-17 MED ORDER — ONDANSETRON HCL 4 MG/2ML IJ SOLN: 4.0000 mg | Freq: Four times a day (QID) | INTRAMUSCULAR | Status: DC | PRN

## 2012-02-17 MED ORDER — ASPIRIN 325 MG PO TABS
325.0000 mg | ORAL_TABLET | Freq: Every day | ORAL | Status: DC
  Administered 2012-02-17 – 2012-02-20 (×4): 325 mg via ORAL
  Filled 2012-02-17 (×4): qty 1

## 2012-02-17 MED ORDER — ENOXAPARIN SODIUM 30 MG/0.3ML ~~LOC~~ SOLN
40.0000 mg | Freq: Every day | SUBCUTANEOUS | Status: DC
  Filled 2012-02-17: qty 0.4

## 2012-02-17 MED ORDER — ACETAMINOPHEN 650 MG RE SUPP: 650.0000 mg | Freq: Four times a day (QID) | RECTAL | Status: DC | PRN

## 2012-02-17 MED ORDER — ALUM & MAG HYDROXIDE-SIMETH 200-200-20 MG/5ML PO SUSP: 30.0000 mL | Freq: Four times a day (QID) | ORAL | Status: DC | PRN

## 2012-02-17 MED ORDER — HYDROCODONE-ACETAMINOPHEN 5-325 MG PO TABS
1.0000 | ORAL_TABLET | ORAL | Status: DC | PRN
  Administered 2012-02-18: 2 via ORAL
  Administered 2012-02-18 – 2012-02-19 (×5): 1 via ORAL
  Administered 2012-02-20: 2 via ORAL
  Filled 2012-02-17 (×6): qty 1
  Filled 2012-02-17: qty 2
  Filled 2012-02-17: qty 1

## 2012-02-17 MED ORDER — CLOPIDOGREL BISULFATE 75 MG PO TABS
75.0000 mg | ORAL_TABLET | Freq: Every day | ORAL | Status: DC
  Administered 2012-02-17 – 2012-02-20 (×4): 75 mg via ORAL
  Filled 2012-02-17 (×5): qty 1

## 2012-02-17 MED ORDER — PIPERACILLIN-TAZOBACTAM 3.375 G IVPB 30 MIN
3.3750 g | Freq: Three times a day (TID) | INTRAVENOUS | Status: DC
  Filled 2012-02-17 (×2): qty 50

## 2012-02-17 MED ORDER — DIAZEPAM 5 MG/ML IJ SOLN
5.0000 mg | Freq: Once | INTRAMUSCULAR | Status: AC
  Administered 2012-02-17: 5 mg via INTRAVENOUS
  Filled 2012-02-17: qty 2

## 2012-02-17 MED ORDER — HYDROMORPHONE HCL PF 1 MG/ML IJ SOLN
1.0000 mg | Freq: Once | INTRAMUSCULAR | Status: AC
  Administered 2012-02-17: 1 mg via INTRAVENOUS
  Filled 2012-02-17: qty 1

## 2012-02-17 MED ORDER — ALBUTEROL SULFATE (5 MG/ML) 0.5% IN NEBU: 2.5000 mg | INHALATION_SOLUTION | RESPIRATORY_TRACT | Status: DC | PRN

## 2012-02-17 MED ORDER — SODIUM CHLORIDE 0.9 % IJ SOLN
3.0000 mL | Freq: Two times a day (BID) | INTRAMUSCULAR | Status: DC
  Administered 2012-02-17: 3 mL via INTRAVENOUS

## 2012-02-17 NOTE — Progress Notes (Signed)
PHARMACY - ANTIBIOTIC CONSULT  INITIAL NOTE  Pharmacy Consult for: Vancomycin, Zosyn  Indication: Cellulitis   Patient Data:   Allergies: No Known Allergies  Patient Measurements: Height: 6' (182.9 cm) Weight: 272 lb 4.3 oz (123.5 kg) IBW/kg (Calculated) : 77.6   Vital Signs: Temp:  [97.8 F (36.6 C)-99.1 F (37.3 C)] 98.1 F (36.7 C) (02/24 0656) Pulse Rate:  [77-94] 77  (02/24 0656) Resp:  [18] 18  (02/24 0656) BP: (127-152)/(76-86) 135/79 mmHg (02/24 0656) SpO2:  [93 %-99 %] 94 % (02/24 0656) Weight:  [272 lb 4.3 oz (123.5 kg)] 272 lb 4.3 oz (123.5 kg) (02/24 0619)  Intake/Output from previous day: No intake or output data in the 24 hours ending 02/17/12 0710  Labs:  Basename 02/17/12 0124 02/17/12 0012  WBC -- 10.1  HGB -- 15.7  PLT -- 257  LABCREA -- --  CREATININE 1.50* --   Estimated Creatinine Clearance: 65.8 ml/min (by C-G formula based on Cr of 1.5). No results found for this basename: VANCOTROUGH:2,VANCOPEAK:2,VANCORANDOM:2,GENTTROUGH:2,GENTPEAK:2,GENTRANDOM:2,TOBRATROUGH:2,TOBRAPEAK:2,TOBRARND:2,AMIKACINPEAK:2,AMIKACINTROU:2,AMIKACIN:2, in the last 72 hours   Microbiology: No results found for this or any previous visit (from the past 720 hour(s)).  Medical History: Past Medical History  Diagnosis Date  . Coronary artery disease   . Diabetes mellitus   . Stroke feb 2012    complication of cath  . Myocardial infarction 2011  . Gout     Scheduled Medications:     . allopurinol  100 mg Oral Daily  . aspirin  325 mg Oral Daily  . atenolol  25 mg Oral BID  . clopidogrel  75 mg Oral Q breakfast  . diazepam  5 mg Intravenous Once  . enoxaparin  40 mg Subcutaneous Daily  . glipiZIDE  2.5 mg Oral BID AC  .  HYDROmorphone (DILAUDID) injection  1 mg Intravenous Once  . insulin aspart  0-15 Units Subcutaneous TID WC  . insulin aspart  0-5 Units Subcutaneous QHS  . isosorbide mononitrate  30 mg Oral BID WC  . piperacillin-tazobactam  3.375 g  Intravenous Q8H  . simvastatin  40 mg Oral q1800  . sodium chloride  3 mL Intravenous Q12H  . sodium chloride  3 mL Intravenous Q12H  . vancomycin  1,000 mg Intravenous Once  . DISCONTD: enoxaparin  40 mg Subcutaneous Daily      Assessment:  66 y.o. male admitted on 02/16/2012, with cellulitis. Pharmacy consulted to manage vancomycin and Zosyn. Patient received 1 gm vancomycin earlier today.    Goal of Therapy:  1. Vancomycin trough level 10-15 mcg/ml  Plan:  1. Zosyn 3.375gm IV Q8H (4 hr infusion).  2. Vancomycin 1.5gm IV x 1 (total of 2.5gm loading dose), then 1250 mg IV Q12H.  3. Will obtain vancomycin trough when indicated.    Dineen Kid Lizzet Hendley, PharmD 02/17/2012, 7:10 AM

## 2012-02-17 NOTE — Progress Notes (Signed)
Subjective: Mr. Mcqueen is admitted with refractory cellulitis right LE having failed outpatient treatment with doxycycline and cipro. HIs leg continued to swell and be painful with erythema.  Objective: Lab: no new lab   Imaging: no imaging  Physical Exam: Filed Vitals:   02/17/12 0656  BP: 135/79  Pulse: 77  Temp: 98.1 F (36.7 C)  Resp: 18  gen'l- overweight AA man in no distress HEENT-  &S clear PUlm - normal breath sounds with no increased work of breathing Cor - 2+ radial pulse, 1-2+ left DP, cold not palpated right DP due to edema Abdomen- obese, BS+ Extremity - right distal LE with 3+ edema w/ pitting Derm- distal right LE from 3 cm below the knee to the foot is erythematous with calore, tender to palpation.    Assessment/Plan: 1. ID/Derm - patient with cellulitis that did not respond to outpatient oral antibiotics. Day # 2 Vanc/zosyn. Plan - continue IV antibiotics 2. Leg swelling - LE venous doppler pending. Patient is on Lovenox 3. DM - on slidng scale. 4. Neuro - s/p CVA - has some word finding difficulty and right hemiparesis. HEis on plavix. 5. Cardiac - stable. No indication for telemetry - will d/c.       Casimiro Needle Delailah Spieth 02/17/2012, 8:25 AM

## 2012-02-17 NOTE — Progress Notes (Signed)
VASCULAR LAB PRELIMINARY  PRELIMINARY  PRELIMINARY  PRELIMINARY  Right lower extremity venous duplex completed.    Preliminary report:  Right:  No evidence of DVT, superficial thrombosis, or Baker's cyst.  Terance Hart, RVT 02/17/2012, 9:59 AM

## 2012-02-17 NOTE — ED Provider Notes (Signed)
66 year old male with a history of prior stroke affecting the right side of his body presents with worsening cellulitis to the right lower extremity. His symptoms are gradually getting worse, the redness is spreading beyond the line marked by his primary doctor and is currently having red streaking up his leg. He does have pain associated with this and admits to fever. He has been taking antibiotics but despite this intervention he has worsening symptoms.   Physical: Right lower extremity with pitting edema tenderness redness and warmth. There is what appears to be streaking red marks up the medial aspect of his right lower extremity knee and thigh. There is tenderness associated with this. Abdomen is soft, lungs are clear, heart is regular without murmurs   Assessment: Worsening cellulitis, requiring IV antibiotics. No leukocytosis but has slight renal insufficiency. Dissipated admission to the hospital for failed outpatient therapy for cellulitis. In addition, pt will need doppler US in the AM   Results for orders placed during the hospital encounter of 02/16/12   CBC   Component  Value  Range    WBC  10.1  4.0 - 10.5 (K/uL)    RBC  5.12  4.22 - 5.81 (MIL/uL)    Hemoglobin  15.7  13.0 - 17.0 (g/dL)    HCT  04.5  40.9 - 81.1 (%)    MCV  91.4  78.0 - 100.0 (fL)    MCH  30.7  26.0 - 34.0 (pg)    MCHC  33.5  30.0 - 36.0 (g/dL)    RDW  91.4  78.2 - 95.6 (%)    Platelets  257  150 - 400 (K/uL)   BASIC METABOLIC PANEL   Component  Value  Range    Sodium  138  135 - 145 (mEq/L)    Potassium  3.8  3.5 - 5.1 (mEq/L)    Chloride  101  96 - 112 (mEq/L)    CO2  28  19 - 32 (mEq/L)    Glucose, Bld  102 (*)  70 - 99 (mg/dL)    BUN  17  6 - 23 (mg/dL)    Creatinine, Ser  2.13 (*)  0.50 - 1.35 (mg/dL)    Calcium  9.8  8.4 - 10.5 (mg/dL)    GFR calc non Af Amer  47 (*)  >90 (mL/min)    GFR calc Af Amer  54 (*)  >90 (mL/min)    Medical screening examination/treatment/procedure(s) were conducted as a  shared visit with non-physician practitioner(s) and myself. I personally evaluated the patient during the encounter   Vida Roller, MD 02/17/12 [phone]

## 2012-02-17 NOTE — Plan of Care (Signed)
Problem: Phase I Progression Outcomes Goal: Wound assessment- dressing change as appropriate Outcome: Completed/Met Date Met:  02/17/12 Aromatherapy Lavender essential oil gauze soaks done on RLE TID on 02-17-12. 16 Drops Lavender + 2 cups water + witchhazel to emulsify. Left on for 1 hour each time.  Goal: Other Phase I Outcomes/Goals Outcome: Completed/Met Date Met:  02/17/12 [facility] education note given on cellulitis

## 2012-02-17 NOTE — H&P (Signed)
PCP:   Georgann Housekeeper, MD, MD  Cardiologist: Dr. Mendel Ryder Neurologist: Dr.Sethie  Chief Complaint:   Pain in right pain with ambulation  HPI: [name] is a 66 y.o. male   has a past medical history of Coronary artery disease; Diabetes mellitus; Stroke (feb 2012); Myocardial infarction (2011); and Gout.   Presented with  Pain in right leg. He had pain, swelling and redness for the past 4 days. He was started on cipro and doxycycline for  3 days. No fever but some chills. Presented to ER becouse pain was worsening without improvement. No chest pain or SOB, but  increased leg edema. This is the leg that is weaker since stroke.  Review of Systems:    Pertinent positives include: leg swelling, difficulty walking  Constitutional:  No weight loss, night sweats, Fevers, chills, fatigue.  HEENT:  No headaches, Difficulty swallowing,Tooth/dental problems,Sore throat,  No sneezing, itching, ear ache, nasal congestion, post nasal drip,  Cardio-vascular:  No chest pain, Orthopnea, PND, anasarca, dizziness, palpitations.no Bilateral lower extremity swelling  GI:  No heartburn, indigestion, abdominal pain, nausea, vomiting, diarrhea, change in bowel habits, loss of appetite, melena, blood in stool, hematoemesis Resp:  no shortness of breath at rest. No dyspnea on exertion, No excess mucus, no productive cough, No non-productive cough, No coughing up of blood.No change in color of mucus.No wheezing.No chest wall deformity  Skin:  no rash or lesions.  GU:  no dysuria, change in color of urine, no urgency or frequency. No flank pain.  Musculoskeletal:  No joint pain or swelling. No decreased range of motion. No back pain.  Psych:  No change in mood or affect. No depression or anxiety. No memory loss.  Neuro: no localizing neurological complaints, no tingling, no weakness, no double vision, no gait abnormality, no slurred speech   Otherwise ROS are negative except for above, 10 systems  were reviewed  Past Medical History: Past Medical History  Diagnosis Date  . Coronary artery disease   . Diabetes mellitus   . Stroke feb 2012    complication of cath  . Myocardial infarction 2011  . Gout    Past Surgical History  Procedure Date  . Coronary artery bypass graft   . Femoral bypass   . Cardiac catheterization     stent done feb 2012     Medications: Prior to Admission medications   Medication Sig Start Date End Date Taking? Authorizing Provider  allopurinol (ZYLOPRIM) 100 MG tablet Take 100 mg by mouth daily.   Yes Erick Colace, MD  aspirin 325 MG tablet Take 325 mg by mouth daily.   Yes Historical Provider, MD  atenolol (TENORMIN) 50 MG tablet Take 25 mg by mouth 2 (two) times daily.    Yes Historical Provider, MD  atorvastatin (LIPITOR) 20 MG tablet Take 20 mg by mouth daily.   Yes Historical Provider, MD  ciprofloxacin (CIPRO) 500 MG tablet Take 500 mg by mouth 2 (two) times daily. 10 days only  Start date:02/14/12   Yes Historical Provider, MD  clopidogrel (PLAVIX) 75 MG tablet Take 75 mg by mouth daily.   Yes Historical Provider, MD  doxycycline (VIBRA-TABS) 100 MG tablet Take 100 mg by mouth 2 (two) times daily. For 10 days  Start date 02/14/12   Yes Historical Provider, MD  glipiZIDE (GLUCOTROL) 5 MG tablet Take 2.5 mg by mouth 2 (two) times daily before a meal.    Yes Historical Provider, MD  isosorbide mononitrate (IMDUR) 60 MG 24  hr tablet Take 30 mg by mouth 2 (two) times daily.    Yes Historical Provider, MD  tiZANidine (ZANAFLEX) 2 MG tablet Take 2 mg by mouth 2 (two) times daily. On hold for 10 days while taking antibiotics   Yes Erick Colace, MD    Allergies:  No Known Allergies  Social History:  Ambulatory with assistance Lives at home with wife   reports that he has quit smoking. He does not have any smokeless tobacco history on file. He reports that he does not drink alcohol or use illicit drugs.   Family History: family  history includes Heart disease in his father and mother.    Physical Exam: Patient Vitals for the past 24 hrs:  BP Temp Temp src Pulse Resp SpO2  02/17/12 0159 134/76 mmHg - - 89  18  98 %  02/16/12 2321 152/80 mmHg 97.8 F (36.6 C) Oral 94  18  93 %    1. General:  in No Acute distress 2. Psychological: Alert and Oriented 3. Head/ENT:   Moist  Mucous Membranes                          Head Non traumatic, neck supple                          Normal  Dentition 4. SKIN: normal Skin turgor,  Skin clean Dry and intact no rash 5. Heart: Regular rate and rhythm no Murmur, Rub or gallop 6. Lungs: Clear to auscultation bilaterally, no wheezes or crackles   7. Abdomen: Soft, non-tender, Non distended 8. Lower extremities: no clubbing, cyanosis, severe edema of right leg 9. Neurologically chronically weak on right, some tangential speech 10. MSK: Normal range of motion  body mass index is unknown because there is no height or weight on file.   Labs on Admission:   [facility] 02/17/12 0124  NA 138  K 3.8  CL 101  CO2 28  GLUCOSE 102*  BUN 17  CREATININE 1.50*  CALCIUM 9.8  MG --  PHOS --   No results found for this basename: AST:2,ALT:2,ALKPHOS:2,BILITOT:2,PROT:2,ALBUMIN:2 in the last 72 hours No results found for this basename: LIPASE:2,AMYLASE:2 in the last 72 hours  Basename 02/17/12 0012  WBC 10.1  NEUTROABS --  HGB 15.7  HCT 46.8  MCV 91.4  PLT 257   No results found for this basename: CKTOTAL:3,CKMB:3,CKMBINDEX:3,TROPONINI:3 in the last 72 hours No results found for this basename: TSH,T4TOTAL,FREET3,T3FREE,THYROIDAB in the last 72 hours No results found for this basename: VITAMINB12:2,FOLATE:2,FERRITIN:2,TIBC:2,IRON:2,RETICCTPCT:2 in the last 72 hours Lab Results  Component Value Date   HGBA1C  Value: 6.4 (NOTE)                                                                       According to the ADA Clinical Practice Recommendations for 2011, when HbA1c is used as  a screening test:   >=6.5%   Diagnostic of Diabetes Mellitus           (if abnormal result  is confirmed)  5.7-6.4%   Increased risk of developing Diabetes Mellitus  References:Diagnosis and Classification of Diabetes Mellitus,Diabetes Care,2011,34(Suppl 1):S62-S69 and Standards of  Medical Care in         Diabetes - 2011,Diabetes Care,2011,34  (Suppl 1):S11-S61.* 01/29/2011    CrCl is unknown because there is no height on file for the current visit. ABG    Component Value Date/Time   PHART 7.327* 05/02/2010 0207   HCO3 22.5 05/02/2010 0207   TCO2 26 05/02/2010 1646   ACIDBASEDEF 3.0* 05/02/2010 0207   O2SAT 91.0 05/02/2010 0207     No results found for this basename: DDIMER     Other results:   Cultures:    Component Value Date/Time   SDES URINE, CATHETERIZED 02/07/2011 1822   SPECREQUEST NONE 02/07/2011 1822   CULT SERRATIA MARCESCENS 02/07/2011 1822   REPTSTATUS 02/09/2011 FINAL 02/07/2011 1822       Radiological Exams on Admission: No results found.  Assessment/Plan  66 yo w history of CVA with 4 day history of Right leg swelling not improving despite cipro and doxy  Present on Admission:  .Cellulitis - will start on IV abx choose vanc and zosyn as he is diabetic with outpatient failure, also would evaluate for DVT .HTN (hypertension) - continue home medications .CKD (chronic kidney disease) - at baseline .Diabetes mellitus - SSI    Prophylaxis:  Lovenox  CODE STATUS: DNR/DNI wife in the room and is aware of his wishes   [name] 02/17/2012, 5:48 AM

## 2012-02-17 NOTE — ED Provider Notes (Signed)
History     CSN: [id number]  Arrival date & time 02/16/12  2311   First MD Initiated Contact with Patient 02/17/12 0048      Chief Complaint  Patient presents with  . Cellulitis    (Consider location/radiation/quality/duration/timing/severity/associated sxs/prior treatment) HPI Comments: Patient here with worsening cellulitis and swelling in the right lower leg - saw PCP, Dr. Donette Larry on Thursday and was placed on cipro and doxycycline for the cellulitis - wife reports that the leg looked better until today.  Patient also complains of worsening leg cramps - states is unable to take his muscle relaxers with the abx currently on - denies fever but reports chills, denies nausea, vomiting, chest pain or shortness of breath.  Patient is a 66 y.o. male presenting with leg pain. The history is provided by the patient and the spouse. No language interpreter was used.  Leg Pain  The incident occurred more than 2 days ago. The incident occurred at home. There was no injury mechanism. The pain is present in the right leg. The quality of the pain is described as aching. The pain is at a severity of 6/10. The pain is moderate. The pain has been constant since onset. Associated symptoms include inability to bear weight and muscle weakness. Pertinent negatives include no numbness, no loss of motion, no loss of sensation and no tingling. The symptoms are aggravated by activity and bearing weight. The treatment provided no relief.    Past Medical History  Diagnosis Date  . Coronary artery disease     Past Surgical History  Procedure Date  . Coronary artery bypass graft   . Femoral bypass     No family history on file.  History  Substance Use Topics  . Smoking status: Not on file  . Smokeless tobacco: Not on file  . Alcohol Use: No      Review of Systems  Constitutional: Positive for chills. Negative for fever.  HENT: Negative for congestion and neck pain.   Eyes: Negative for pain.    Respiratory: Negative for chest tightness and shortness of breath.   Cardiovascular: Positive for leg swelling. Negative for chest pain and palpitations.  Gastrointestinal: Negative for abdominal pain.  Genitourinary: Negative for hematuria and flank pain.  Musculoskeletal: Positive for myalgias and gait problem. Negative for back pain.  Skin: Positive for rash.  Neurological: Negative for tingling, numbness and headaches.  All other systems reviewed and are negative.    Allergies  Review of patient's allergies indicates no known allergies.  Home Medications   Current Outpatient Rx  Name Route Sig Dispense Refill  . ALLOPURINOL 100 MG PO TABS Oral Take 100 mg by mouth daily.    . ASPIRIN 325 MG PO TABS Oral Take 325 mg by mouth daily.    . ATENOLOL 50 MG PO TABS Oral Take 25 mg by mouth 2 (two) times daily.     . ATORVASTATIN CALCIUM 20 MG PO TABS Oral Take 20 mg by mouth daily.    Marland Kitchen CIPROFLOXACIN HCL 500 MG PO TABS Oral Take 500 mg by mouth 2 (two) times daily. 10 days only  Start date:02/14/12    . CLOPIDOGREL BISULFATE 75 MG PO TABS Oral Take 75 mg by mouth daily.    Marland Kitchen DOXYCYCLINE HYCLATE 100 MG PO TABS Oral Take 100 mg by mouth 2 (two) times daily. For 10 days  Start date 02/14/12    . GLIPIZIDE 5 MG PO TABS Oral Take 2.5 mg by mouth 2 (two)  times daily before a meal.     . ISOSORBIDE MONONITRATE ER 60 MG PO TB24 Oral Take 30 mg by mouth 2 (two) times daily.     Marland Kitchen TIZANIDINE HCL 2 MG PO TABS Oral Take 2 mg by mouth 2 (two) times daily. On hold for 10 days while taking antibiotics      BP 134/76  Pulse 89  Temp(Src) 97.8 F (36.6 C) (Oral)  Resp 18  SpO2 98%  Physical Exam  Nursing note and vitals reviewed. Constitutional: He is oriented to person, place, and time. He appears well-developed and well-nourished. He appears distressed.  HENT:  Head: Normocephalic and atraumatic.  Right Ear: External ear normal.  Left Ear: External ear normal.  Nose: Nose normal.   Mouth/Throat: Oropharynx is clear and moist. No oropharyngeal exudate.  Eyes: Conjunctivae are normal. Pupils are equal, round, and reactive to light. No scleral icterus.  Neck: Normal range of motion. Neck supple.  Cardiovascular: Normal rate, regular rhythm and normal heart sounds.  Exam reveals no gallop and no friction rub.   No murmur heard. Pulmonary/Chest: Effort normal and breath sounds normal. No respiratory distress. He exhibits no tenderness.  Abdominal: Soft. Bowel sounds are normal. He exhibits no distension. There is no tenderness.  Musculoskeletal:       Right upper leg: He exhibits swelling and edema.       Legs: Lymphadenopathy:    He has no cervical adenopathy.  Neurological: He is alert and oriented to person, place, and time.       Right hemiparesis  Skin: Skin is warm and dry. There is erythema.  Psychiatric: He has a normal mood and affect. His behavior is normal. Judgment and thought content normal.    ED Course  Procedures (including critical care time)  Labs Reviewed  BASIC METABOLIC PANEL - Abnormal; Notable for the following:    Glucose, Bld 102 (*)    Creatinine, Ser 1.50 (*)    GFR calc non Af Amer 47 (*)    GFR calc Af Amer 54 (*)    All other components within normal limits  CBC  CULTURE, BLOOD (ROUTINE X 2)  CULTURE, BLOOD (ROUTINE X 2)   No results found.  Results for orders placed during the hospital encounter of 02/16/12  CBC      Component Value Range   WBC 10.1  4.0 - 10.5 (K/uL)   RBC 5.12  4.22 - 5.81 (MIL/uL)   Hemoglobin 15.7  13.0 - 17.0 (g/dL)   HCT 16.1  09.6 - 04.5 (%)   MCV 91.4  78.0 - 100.0 (fL)   MCH 30.7  26.0 - 34.0 (pg)   MCHC 33.5  30.0 - 36.0 (g/dL)   RDW 40.9  81.1 - 91.4 (%)   Platelets 257  150 - 400 (K/uL)  BASIC METABOLIC PANEL      Component Value Range   Sodium 138  135 - 145 (mEq/L)   Potassium 3.8  3.5 - 5.1 (mEq/L)   Chloride 101  96 - 112 (mEq/L)   CO2 28  19 - 32 (mEq/L)   Glucose, Bld 102 (*) 70 -  99 (mg/dL)   BUN 17  6 - 23 (mg/dL)   Creatinine, Ser 7.82 (*) 0.50 - 1.35 (mg/dL)   Calcium 9.8  8.4 - 95.6 (mg/dL)   GFR calc non Af Amer 47 (*) >90 (mL/min)   GFR calc Af Amer 54 (*) >90 (mL/min)   No results found.   Cellulitis  MDM  Patient with failure of outpatient cellulitis treatment - no sign of sepsis here - will start on Vancomycin and Dr. Hyacinth Meeker will get the patient admitted - also have ordered doppler of the LE though DVT is less likely.  No chest pain or shortness of breath.        Izola Price [place], [place] 02/17/12 0210  Spoke with Dr. Adela Glimpse, we will get the patient admitted to Dr. Donette Larry.  Izola Price [place], [place] 02/17/12 [phone]

## 2012-02-17 NOTE — Progress Notes (Signed)
SHABAZZ MCKEY [id number] Admission Data: 02/17/2012 8:10 AM Attending Provider: Georgann Housekeeper, MD  WUJ:[email or web address],NWGNFA, MD, MD Consults/ Treatment Team: Treatment Team:  Georgann Housekeeper, MD  [name] is a 66 y.o. male patient admitted from ED awake, alert  & orientated  X 3,  DNR, VSS - Blood pressure 135/79, pulse 77, temperature 98.1 F (36.7 C), temperature source Oral, resp. rate 18, height 6' (1.829 m), weight 123.5 kg (272 lb 4.3 oz), SpO2 94.00%., no c/o shortness of breath, no c/o chest pain, no distress noted. Tele [phone] placed and pt is currently running: 1st degree heart block.   IV site WDL:  antecubital left, condition patent and no redness with a transparent dsg that's clean dry and intact.  Allergies:  No Known Allergies   Past Medical History  Diagnosis Date  . Coronary artery disease   . Diabetes mellitus   . Stroke feb 2012    complication of cath  . Myocardial infarction 2011  . Gout   . Peripheral vascular disease     History:  obtained from the patient. Tobacco/alcohol: denied none  Pt orientation to unit, room and routine. Information packet given to patient/family and safety video watched.  Admission INP armband ID verified with patient/family, and in place. SR up x 2, fall risk assessment complete with Patient and family verbalizing understanding of risks associated with falls. Pt verbalizes an understanding of how to use the call bell and to call for help before getting out of bed.  Skin, clean-dry- intact without evidence of bruising, or skin tears.   No evidence of skin break down noted on exam. RLE red and warm with edema    Will cont to monitor and assist as needed.  Redell Bhandari Consuella Lose, RN 02/17/2012 8:10 AM

## 2012-02-18 DIAGNOSIS — I2510 Atherosclerotic heart disease of native coronary artery without angina pectoris: Secondary | ICD-10-CM | POA: Diagnosis not present

## 2012-02-18 DIAGNOSIS — L02419 Cutaneous abscess of limb, unspecified: Secondary | ICD-10-CM | POA: Diagnosis not present

## 2012-02-18 DIAGNOSIS — E119 Type 2 diabetes mellitus without complications: Secondary | ICD-10-CM | POA: Diagnosis not present

## 2012-02-18 DIAGNOSIS — I25810 Atherosclerosis of coronary artery bypass graft(s) without angina pectoris: Secondary | ICD-10-CM

## 2012-02-18 DIAGNOSIS — I69959 Hemiplegia and hemiparesis following unspecified cerebrovascular disease affecting unspecified side: Secondary | ICD-10-CM | POA: Diagnosis not present

## 2012-02-18 DIAGNOSIS — I10 Essential (primary) hypertension: Secondary | ICD-10-CM | POA: Diagnosis not present

## 2012-02-18 DIAGNOSIS — I69359 Hemiplegia and hemiparesis following cerebral infarction affecting unspecified side: Secondary | ICD-10-CM

## 2012-02-18 DIAGNOSIS — I872 Venous insufficiency (chronic) (peripheral): Secondary | ICD-10-CM | POA: Diagnosis not present

## 2012-02-18 LAB — COMPREHENSIVE METABOLIC PANEL
ALT: 30 U/L (ref 0–53)
Albumin: 2.7 g/dL — ABNORMAL LOW (ref 3.5–5.2)
Calcium: 8.9 mg/dL (ref 8.4–10.5)
GFR calc Af Amer: 65 mL/min — ABNORMAL LOW (ref >90)
Glucose, Bld: 104 mg/dL — ABNORMAL HIGH (ref 70–99)
Potassium: 4.3 mEq/L (ref 3.5–5.1)
Sodium: 138 mEq/L (ref 135–145)
Total Protein: 6.8 g/dL (ref 6.0–8.3)

## 2012-02-18 LAB — CBC
HCT: 41.8 % (ref 39.0–52.0)
Hemoglobin: 14.1 g/dL (ref 13.0–17.0)
MCHC: 33.7 g/dL (ref 30.0–36.0)
MCV: 90.1 fL (ref 78.0–100.0)
WBC: 7.9 10*3/uL (ref 4.0–10.5)

## 2012-02-18 LAB — GLUCOSE, CAPILLARY
Glucose-Capillary: 113 mg/dL — ABNORMAL HIGH (ref 70–99)
Glucose-Capillary: 86 mg/dL (ref 70–99)
Glucose-Capillary: 116 mg/dL — ABNORMAL HIGH (ref 70–99)

## 2012-02-18 LAB — MAGNESIUM: Magnesium: 1.9 mg/dL (ref 1.5–2.5)

## 2012-02-18 LAB — PHOSPHORUS: Phosphorus: 3.7 mg/dL (ref 2.3–4.6)

## 2012-02-18 MED ORDER — METHOCARBAMOL 500 MG PO TABS
500.0000 mg | ORAL_TABLET | Freq: Four times a day (QID) | ORAL | Status: DC | PRN
  Administered 2012-02-18 – 2012-02-19 (×2): 500 mg via ORAL
  Filled 2012-02-18 (×2): qty 1

## 2012-02-18 MED ORDER — DOCUSATE SODIUM 100 MG PO CAPS
100.0000 mg | ORAL_CAPSULE | Freq: Two times a day (BID) | ORAL | Status: DC
  Administered 2012-02-18 – 2012-02-19 (×4): 100 mg via ORAL
  Filled 2012-02-18 (×6): qty 1

## 2012-02-18 MED ORDER — ATORVASTATIN CALCIUM 10 MG PO TABS
20.0000 mg | ORAL_TABLET | Freq: Every day | ORAL | Status: DC
  Administered 2012-02-18 – 2012-02-19 (×2): 20 mg via ORAL
  Filled 2012-02-18 (×3): qty 2

## 2012-02-18 NOTE — Evaluation (Signed)
Physical Therapy Evaluation Patient Details Name: [name] MRN: [id number] DOB: Jul 17, 1946 Today's Date: 02/18/2012  Problem List:  Patient Active Problem List  Diagnoses  . Cellulitis  . HTN (hypertension)  . CKD (chronic kidney disease)  . Diabetes mellitus  . CVA, old, hemiparesis  . CAD (coronary artery disease) of artery bypass graft    Past Medical History:  Past Medical History  Diagnosis Date  . Coronary artery disease   . Diabetes mellitus   . Stroke feb 2012    complication of cath  . Myocardial infarction 2011  . Gout   . Peripheral vascular disease    Past Surgical History:  Past Surgical History  Procedure Date  . Coronary artery bypass graft   . Femoral bypass   . Cardiac catheterization     stent done feb 2012  . Vascular surgery     PT Assessment/Plan/Recommendation PT Assessment Clinical Impression Statement: Pt adm with cellulitis in rt leg.  Pt's mobility limited by pain from this.  As pain improves mobiltiy should improve and pt should be able to return home with wife. PT Recommendation/Assessment: Patient will need skilled PT in the acute care venue PT Problem List: Decreased strength;Pain;Decreased mobility;Decreased activity tolerance;Decreased balance PT Therapy Diagnosis : Difficulty walking;Acute pain PT Plan PT Frequency: Min 3X/week PT Treatment/Interventions: DME instruction;Gait training;Functional mobility training;Therapeutic activities;Therapeutic exercise;Balance training;Patient/family education PT Recommendation Follow Up Recommendations: Outpatient PT (if pt not back to baseline) Equipment Recommended: None recommended by PT PT Goals  Acute Rehab PT Goals PT Goal Formulation: With patient Time For Goal Achievement: 7 days Pt will go Supine/Side to Sit: with supervision PT Goal: Supine/Side to Sit - Progress: Goal set today Pt will go Sit to Supine/Side: with supervision PT Goal: Sit to Supine/Side - Progress: Goal  set today Pt will go Sit to Stand: with min assist PT Goal: Sit to Stand - Progress: Goal set today Pt will go Stand to Sit: with min assist PT Goal: Stand to Sit - Progress: Goal set today Pt will Transfer Bed to Chair/Chair to Bed: with min assist Pt will Ambulate: 16 - 50 feet;with min assist;with least restrictive assistive device PT Goal: Ambulate - Progress: Goal set today  PT Evaluation Precautions/Restrictions  Precautions Precautions: Fall Prior Functioning  Home Living Lives With: Spouse Receives Help From: Family Type of Home: House Home Layout: Two level;Full bath on main level;Able to live on main level with bedroom/bathroom Home Access: Ramped entrance Bathroom Shower/Tub: Walk-in shower Home Adaptive Equipment: Shower chair with back;Quad cane;Walker - rolling Prior Function Level of Independence: Requires assistive device for independence;Independent with gait;Independent with transfers;Needs assistance with gait;Needs assistance with tranfers Driving: No Vocation: On disability Cognition Cognition Arousal/Alertness: Awake/alert Overall Cognitive Status: History of cognitive impairments History of Cognitive Impairment: Appears at baseline functioning Sensation/Coordination   Extremity Assessment RLE Strength RLE Overall Strength Comments: grossly3-/5 LLE Assessment LLE Assessment: Within Functional Limits Mobility (including Balance) Transfers Transfers: Yes Sit to Stand: 1: +2 Total assist;Patient percentage (comment) (pt=70%) Sit to Stand Details (indicate cue type and reason): used manual faciilitation for hip/trunk extension Stand to Sit: 1: +2 Total assist;Patient percentage (comment) (pt = 80%) Stand Pivot Transfers: 1: +2 Total assist;Patient percentage (comment) (pt = 70%) Stand Pivot Transfer Details (indicate cue type and reason): Used Acupuncturist via Lift Equipment: Teacher, early years/pre Standing - Balance Support: Bilateral  upper extremity supported (on walker) Static Standing - Level of Assistance: 4: Min assist Static Standing - Comment/#  of Minutes: 1 minute with rolling walker Exercise    End of Session PT - End of Session Equipment Utilized During Treatment: Gait belt Activity Tolerance: Patient limited by pain Patient left: in chair;with call bell in reach;with family/visitor present Nurse Communication: Mobility status for transfers  [facility] 02/18/2012, 2:22 PM  [facility] PT [phone]

## 2012-02-18 NOTE — Progress Notes (Signed)
Patient ID: [name], male   DOB: 27-Aug-1946, 66 y.o.   MRN: [id number] Subjective: Pt with right leg cellulitis. Failed out pt  Cipro/doxy Improved with IV zosyn/vanc WBC normal. Pain control Doppler- negative for dvt Chronic leg edema/ venous insuff- with right leg weakness due to stroke  Objective: Vital signs in last 24 hours: Temp:  [98.3 F (36.8 C)-99.7 F (37.6 C)] 98.3 F (36.8 C) (02/25 0622) Pulse Rate:  [62-79] 65  (02/25 0622) Resp:  [18] 18  (02/25 0622) BP: (117-126)/(70-76) 117/76 mmHg (02/25 0622) SpO2:  [91 %-92 %] 91 % (02/25 0622)   Intake/Output from previous day: 02/24 0701 - 02/25 0700 In: 1610 [P.O.:720; I.V.:40; IV Piggyback:850] Out: 1175 [Urine:1175]    General appearance: alert and cooperative Extremities: edema , and some redness on right lower leg.  Lab Results:  Basename 02/18/12 0615 02/17/12 0925  WBC 7.9 8.7  HGB 14.1 13.5  HCT 41.8 42.7  PLT 257 230   BMET  Basename 02/18/12 0615 02/17/12 0925 02/17/12 0124  NA 138 -- 138  K 4.3 -- 3.8  CL 106 -- 101  CO2 24 -- 28  GLUCOSE 104* -- 102*  BUN 14 -- 17  CREATININE 1.29 1.19 --  CALCIUM 8.9 -- 9.8   Lab Results  Component Value Date   ALT 30 02/18/2012   AST 26 02/18/2012   ALKPHOS 65 02/18/2012   BILITOT 0.7 02/18/2012    Assessment/Plan:  Active Problems:  Cellulitis IMPROVING; CONTINUE IV ABX   HTN (hypertension) STABLE  CKD (chronic kidney disease) CR BETTER.  Diabetes mellitus CONTINUE ORAL MED  CVA, old, hemiparesis PT THERAPY CHRONIC EDEMA ISSUE/ TED HOSE  CAD (coronary artery disease) of artery bypass graft STABLE  Cheryl Stabenow 02/18/2012, 7:44 AM

## 2012-02-19 DIAGNOSIS — I2510 Atherosclerotic heart disease of native coronary artery without angina pectoris: Secondary | ICD-10-CM | POA: Diagnosis not present

## 2012-02-19 DIAGNOSIS — E119 Type 2 diabetes mellitus without complications: Secondary | ICD-10-CM | POA: Diagnosis not present

## 2012-02-19 DIAGNOSIS — I872 Venous insufficiency (chronic) (peripheral): Secondary | ICD-10-CM

## 2012-02-19 DIAGNOSIS — I10 Essential (primary) hypertension: Secondary | ICD-10-CM | POA: Diagnosis not present

## 2012-02-19 DIAGNOSIS — I69959 Hemiplegia and hemiparesis following unspecified cerebrovascular disease affecting unspecified side: Secondary | ICD-10-CM | POA: Diagnosis not present

## 2012-02-19 DIAGNOSIS — L02419 Cutaneous abscess of limb, unspecified: Secondary | ICD-10-CM | POA: Diagnosis not present

## 2012-02-19 LAB — GLUCOSE, CAPILLARY: Glucose-Capillary: 92 mg/dL (ref 70–99)

## 2012-02-19 MED ORDER — AMOXICILLIN-POT CLAVULANATE 875-125 MG PO TABS: 1.0000 | ORAL_TABLET | Freq: Two times a day (BID) | ORAL | Status: AC

## 2012-02-19 MED ORDER — DSS 100 MG PO CAPS: 100.0000 mg | ORAL_CAPSULE | Freq: Two times a day (BID) | ORAL | Status: AC

## 2012-02-19 MED ORDER — HYDROCODONE-ACETAMINOPHEN 5-325 MG PO TABS: 1.0000 | ORAL_TABLET | ORAL | Status: AC | PRN

## 2012-02-19 NOTE — Progress Notes (Signed)
Noted order for [facility] needs, however needs unclear at this time, noted PT eval recommending outpatient physical therapy. MD please clarify HH need.  Johny Shock RN MPH Case Manager [phone]

## 2012-02-19 NOTE — Progress Notes (Signed)
Patient ID: [name], male   DOB: [birth year], 66 y.o.   MRN: [id number] Subjective: Pt with some pain. In leg No fever   Objective: Vital signs in last 24 hours: Temp:  [97.4 F (36.3 C)-100.3 F (37.9 C)] 98.5 F (36.9 C) (02/26 0554) Pulse Rate:  [50-75] 64  (02/26 0554) Resp:  [17-19] 17  (02/26 0554) BP: (110-135)/(69-74) 117/69 mmHg (02/26 0554) SpO2:  [92 %-95 %] 93 % (02/26 0554)   Intake/Output from previous day: 02/25 0701 - 02/26 0700 In: 1020 [P.O.:720; IV Piggyback:300] Out: 800 [Urine:800]    General appearance: alert Resp: clear to auscultation bilaterally Cardio: regular rate and rhythm, S1, S2 normal, no murmur, click, rub or gallop Extremities: edema redness lower leg improved  Lab Results:  Basename 02/18/12 0615 02/17/12 0925  WBC 7.9 8.7  HGB 14.1 13.5  HCT 41.8 42.7  PLT 257 230   BMET  Basename 02/18/12 0615 02/17/12 0925 02/17/12 0124  NA 138 -- 138  K 4.3 -- 3.8  CL 106 -- 101  CO2 24 -- 28  GLUCOSE 104* -- 102*  BUN 14 -- 17  CREATININE 1.29 1.19 --  CALCIUM 8.9 -- 9.8   Lab Results  Component Value Date   ALT 30 02/18/2012   AST 26 02/18/2012   ALKPHOS 65 02/18/2012   BILITOT 0.7 02/18/2012    Assessment/Plan:  Active Problems:  Cellulitis IMPROVING; CONTINUE IV ABX TODAY  HTN (hypertension) BP OK  CKD (chronic kidney disease) CR STABLE  Diabetes mellitus  CVA, old, hemiparesis PT THERAPY  CAD (coronary artery disease) of artery bypass graft VENOUS INSUFFICIENCY TED HOSE, ELEVATION. HHN/PT    Sarahi Borland 02/19/2012, 6:29 AM

## 2012-02-19 NOTE — Progress Notes (Signed)
Physical Therapy Treatment Patient Details Name: [name] MRN: [id number] DOB: 02-16-1946 Today's Date: 02/19/2012  PT Assessment/Plan  PT - Assessment/Plan Comments on Treatment Session: Pt adm with cellulitis of RLE.  Pt limited by pain.  Pt/wife prefer follow-up OP PT.  Wife can drive pt to OP. PT Plan: Discharge plan remains appropriate Follow Up Recommendations: Outpatient PT Equipment Recommended: None recommended by PT PT Goals  Acute Rehab PT Goals PT Goal: Sit to Stand - Progress: Progressing toward goal PT Goal: Stand to Sit - Progress: Progressing toward goal PT Goal: Ambulate - Progress: Progressing toward goal  PT Treatment Precautions/Restrictions  Precautions Precautions: Fall Restrictions Weight Bearing Restrictions: No Mobility (including Balance) Bed Mobility Bed Mobility: No Transfers Sit to Stand: 1: +2 Total assist;Patient percentage (comment);From chair/3-in-1;With upper extremity assist;With armrests (Pt = 75%) Sit to Stand Details (indicate cue type and reason): Cues for hand placement Stand to Sit: 4: Min assist;With upper extremity assist;With armrests;To chair/3-in-1 Stand to Sit Details: assist to control descent Ambulation/Gait Ambulation/Gait: Yes Ambulation/Gait Assistance: 4: Min assist Ambulation/Gait Assistance Details (indicate cue type and reason): Cues to incr step length on rt. Ambulation Distance (Feet): 6 Feet Assistive device: Rolling walker Gait Pattern: Step-to pattern;Decreased step length - right;Decreased step length - left;Shuffle Gait velocity: very slow  Static Standing Balance Static Standing - Balance Support: Bilateral upper extremity supported (on walker) Static Standing - Level of Assistance: 4: Min assist Static Standing - Comment/# of Minutes: 1 minute Exercise    End of Session PT - End of Session Activity Tolerance: Patient limited by pain Patient left: in chair;with call bell in reach;with  family/visitor present Nurse Communication: Mobility status for transfers General Behavior During Session: [facility] for tasks performed Cognition: Impaired, at baseline  [name] 02/19/2012, 1:44 PM

## 2012-02-20 DIAGNOSIS — E119 Type 2 diabetes mellitus without complications: Secondary | ICD-10-CM | POA: Diagnosis not present

## 2012-02-20 DIAGNOSIS — I69959 Hemiplegia and hemiparesis following unspecified cerebrovascular disease affecting unspecified side: Secondary | ICD-10-CM | POA: Diagnosis not present

## 2012-02-20 DIAGNOSIS — I10 Essential (primary) hypertension: Secondary | ICD-10-CM | POA: Diagnosis not present

## 2012-02-20 DIAGNOSIS — I872 Venous insufficiency (chronic) (peripheral): Secondary | ICD-10-CM | POA: Diagnosis not present

## 2012-02-20 DIAGNOSIS — L02419 Cutaneous abscess of limb, unspecified: Secondary | ICD-10-CM | POA: Diagnosis not present

## 2012-02-20 DIAGNOSIS — I2510 Atherosclerotic heart disease of native coronary artery without angina pectoris: Secondary | ICD-10-CM | POA: Diagnosis not present

## 2012-02-20 LAB — GLUCOSE, CAPILLARY: Glucose-Capillary: 129 mg/dL — ABNORMAL HIGH (ref 70–99)

## 2012-02-20 NOTE — Progress Notes (Signed)
   CARE MANAGEMENT NOTE 02/20/2012  Patient:  DARICK, FETTERS   Account Number:  [id number]  Date Initiated:  02/19/2012  Documentation initiated by:  Johny Shock  Subjective/Objective Assessment:   Order for [facility] needs.     Action/Plan:   Unable to identify HH needs at this time, note left for MD pt identify needs. PT eval recommending outpatient PT.  Outpatient PT noted in discharge instructions.   Anticipated DC Date:  02/20/2012   Anticipated DC Plan:  HOME/SELF CARE         Choice offered to / List presented to:             Status of service:  Completed, signed off Medicare Important Message given?   (If response is "NO", the following Medicare IM given date fields will be blank) Date Medicare IM given:   Date Additional Medicare IM given:    Discharge Disposition:  HOME/SELF CARE  Per UR Regulation:    Comments:  02/20/2012 Met with pt and wife re Outpatient PT, pt has previously has outpatient at [facility]. Call placed to that facility and pt info faxed, ( H&P, d/c summary, demographics and d/c instruction). [facility] will need a prescription for this care, Dr Paulita Fujita RN at this office, Joni Reining notifed , message left asking her to fax prescription to [facility]. Pt and wife aware of this. [facility] will call pt at home with appointment after prescrition received. Starlyn Skeans RN MPH Case Manager [phone]

## 2012-02-20 NOTE — Discharge Summary (Signed)
Physician Discharge Summary  Patient ID: [name] MRN: [id number] DOB/AGE: 1946-07-19 66 y.o.  Admit date: 02/16/2012 Discharge date: 02/20/2012  Admission Diagnoses:  Discharge Diagnoses:  Principal Problem:  *Cellulitis Active Problems:  HTN (hypertension)  CKD (chronic kidney disease)  Diabetes mellitus  CVA, old, hemiparesis  CAD (coronary artery disease) of artery bypass graft  Venous insufficiency   Discharged Condition: good  Hospital Course: 66 year old male, admitted with right lower leg cellulitis, failed outpatient therapy with p.o. Antibiotics. Increasing pain in the leg and the swelling of the right lower leg. Previous history of stroke with right hemiparesis and right-sided weakness, history of CAD CABG, hypertension, diabetes. Problem #1: right lower leg survived his, patient was seen in the outpatient setting started on antibiotics Cipro and doxycycline no improvement in 48 hours with worsening of the swelling and pain and redness. He was admitted to hospital started on IV antibiotic Zosyn and vancomycin, his swelling and redness started to improve, his white count remained normal. His blood culture remained negative. The swelling persisted. He had Doppler study done of the right lower leg negative for DVT His right leg has stroke and weakness contributing to his swallowing with some venous insufficiency. We continued IV antibiotics switched to Augmentin p.o. Which will continue for 10 more days. He remained afebrile white count remained normal Encourage him to continue with therapy as well as elevating the right leg and compression hose. Pain control with Vicodin He also has lower leg spasms, Zanaflex b.i.d. Which he was taking after stroke continued. Problem #2: history of CVA with right hemiparesis and right-sided weakness physical therapy consulted, patient is asked to do outpatient therapy at his previous outpatient physical therapy the patient upon  discharge.. Problem #3: diabetes his blood sugar remained controlled continue on his oral medications. Problem #4: CAD hypertension patient remained stable blood pressure remained stable.   Consults: None  Significant Diagnostic Studies: labs: WBC count normal, blood chemistries remained normal initially had some azotemia resolved after hydration, thyroid remained normal her sugars normal., microbiology: blood culture: negative and radiology: CXR: normal and Ultrasound:right lower leg Doppler negative for DVT. Treatments: IV hydration and antibiotics: vancomycin and Zosyn  Discharge Exam: Blood pressure 141/89, pulse 75, temperature 98.2 F (36.8 C), temperature source Oral, resp. rate 19, height 6' (1.829 m), weight 123.5 kg (272 lb 4.3 oz), SpO2 94.00%. General appearance: alert Resp: clear to auscultation bilaterally Cardio: regular rate and rhythm, S1, S2 normal, no murmur, click, rub or gallop Extremities: edema right lower leg redness.  Disposition: 01-Home or Self Care; physical therapy outpatient.  Discharge Orders    Future Appointments: Provider: Department: Dept Phone: Center:   06/03/2012 10:15 AM Erick Colace, MD [facility] [phone] None     Future Orders Please Complete By Expires   Diet - low sodium heart healthy      Diet - low sodium heart healthy      Increase activity slowly      Increase activity slowly      Discharge instructions      Comments:   Out patient PT at [facility]  To continue.     Medication List  As of 02/20/2012  8:14 AM   STOP taking these medications         ciprofloxacin 500 MG tablet      doxycycline 100 MG tablet         TAKE these medications         allopurinol 100 MG tablet  Commonly known as: ZYLOPRIM   Take 100 mg by mouth daily.      amoxicillin-clavulanate 875-125 MG per tablet   Commonly known as: AUGMENTIN   Take 1 tablet by mouth 2 (two) times daily.      aspirin 325 MG tablet   Take 325 mg  by mouth daily.      atenolol 50 MG tablet   Commonly known as: TENORMIN   Take 25 mg by mouth 2 (two) times daily.      atorvastatin 20 MG tablet   Commonly known as: LIPITOR   Take 20 mg by mouth daily.      clopidogrel 75 MG tablet   Commonly known as: PLAVIX   Take 75 mg by mouth daily.      DSS 100 MG Caps   Take 100 mg by mouth 2 (two) times daily.      glipiZIDE 5 MG tablet   Commonly known as: GLUCOTROL   Take 2.5 mg by mouth 2 (two) times daily before a meal.      HYDROcodone-acetaminophen 5-325 MG per tablet   Commonly known as: NORCO   Take 1-2 tablets by mouth every 4 (four) hours as needed.      isosorbide mononitrate 60 MG 24 hr tablet   Commonly known as: IMDUR   Take 30 mg by mouth 2 (two) times daily.      tiZANidine 2 MG tablet   Commonly known as: ZANAFLEX   Take 2 mg by mouth 2 (two) times daily. On hold for 10 days while taking antibiotics           Follow-up Information    Follow up with Georgann Housekeeper, MD .       Discharge planning time taken over 40 minutes. Follow up in 10 days.  SignedGeorgann Housekeeper 02/20/2012, 8:14 AM

## 2012-02-20 NOTE — Progress Notes (Signed)
Physical Therapy Treatment/ D/C note Patient Details Name: [name] MRN: [id number] DOB: [birth year] Today's Date: 02/20/2012  PT Assessment/Plan  PT - Assessment/Plan Comments on Treatment Session: Pt's mobility continues to be limited by pain in the RLE and pt has concerns about d/c home today with pain and decreased mobility.  Encouraged him that pain should continue to improve and mobility proportionally and that he would be followed by OP PT.  Pt to d/c from hospital  today. PT Plan: Discharge plan remains appropriate PT Frequency: Min 3X/week Follow Up Recommendations: Outpatient PT Equipment Recommended: None recommended by PT PT Goals  Acute Rehab PT Goals PT Goal Formulation: With patient Time For Goal Achievement: 7 days Pt will go Sit to Stand: with min assist PT Goal: Sit to Stand - Progress: Met Pt will go Stand to Sit: with min assist PT Goal: Stand to Sit - Progress: Met Pt will Transfer Bed to Chair/Chair to Bed: with min assist PT Transfer Goal: Bed to Chair/Chair to Bed - Progress: Met Pt will Ambulate: 16 - 50 feet;with min assist;with least restrictive assistive device PT Goal: Ambulate - Progress: Discontinued (comment) (pt will d/c home at short distance/ transfer level)  PT Treatment Precautions/Restrictions  Precautions Precautions: Fall Required Braces or Orthoses: No Restrictions Weight Bearing Restrictions: No Mobility (including Balance) Bed Mobility Bed Mobility: No Transfers Transfers: Yes Sit to Stand: 4: Min assist;From bed;From elevated surface;With upper extremity assist Sit to Stand Details (indicate cue type and reason): min A given at pt's lumbar region and sternum simultaneously.  Pt was unable to get up with supervisoin only but was able to stand with facilitation these 2 places Stand to Sit: 4: Min assist;With upper extremity assist;To bed;To chair/3-in-1 (min-guard A) Ambulation/Gait Ambulation/Gait: Yes Ambulation/Gait  Assistance: 4: Min assist Ambulation/Gait Assistance Details (indicate cue type and reason): min A given for safety Ambulation Distance (Feet): 3 Feet Assistive device: Rolling walker Gait Pattern: Step-to pattern;Decreased step length - left (decreased weight RLE) Gait velocity: very slow Stairs: No Wheelchair Mobility Wheelchair Mobility: No  Posture/Postural Control Posture/Postural Control: No significant limitations Balance Balance Assessed: Yes Static Standing Balance Static Standing - Balance Support: Bilateral upper extremity supported Static Standing - Level of Assistance: 5: Stand by assistance Static Standing - Comment/# of Minutes: pt stands with supervision unless he experiences spasming in the right lower leg in which case he is unsteady and needs to sit ASAP Dynamic Standing Balance Dynamic Standing - Balance Support: Bilateral upper extremity supported Dynamic Standing - Level of Assistance: 4: Min assist Exercise  General Exercises - Lower Extremity Ankle Circles/Pumps: AAROM;Right;10 reps;Seated (with sheet) End of Session PT - End of Session Equipment Utilized During Treatment: Gait belt Activity Tolerance: Patient limited by pain Patient left: in chair;with call bell in reach;with family/visitor present General Behavior During Session: [facility] for tasks performed Cognition: Impaired, at baseline  [name], [place]  [phone] 02/20/2012, 12:17 PM

## 2012-02-20 NOTE — Progress Notes (Signed)
UR completed 

## 2012-02-20 NOTE — Progress Notes (Signed)
Pt discharged to home per MD order.  IV removed, dressing and pressure applied, site unremarkable.  Discharge instructions, prescriptions, and follow-up appointments given.  All belongings sent with pt and all questions answered.  Pt transported in wheelchair and traveled home with wife by private vehicle.

## 2012-02-23 LAB — CULTURE, BLOOD (ROUTINE X 2)
Culture  Setup Time: 201302241040
Culture: NO GROWTH
Culture: NO GROWTH

## 2012-03-04 ENCOUNTER — Ambulatory Visit: Payer: Medicare Other | Attending: Internal Medicine | Admitting: Physical Therapy

## 2012-03-04 DIAGNOSIS — I69998 Other sequelae following unspecified cerebrovascular disease: Secondary | ICD-10-CM | POA: Insufficient documentation

## 2012-03-04 DIAGNOSIS — IMO0001 Reserved for inherently not codable concepts without codable children: Secondary | ICD-10-CM | POA: Diagnosis not present

## 2012-03-04 DIAGNOSIS — R269 Unspecified abnormalities of gait and mobility: Secondary | ICD-10-CM | POA: Diagnosis not present

## 2012-03-04 DIAGNOSIS — M6281 Muscle weakness (generalized): Secondary | ICD-10-CM | POA: Diagnosis not present

## 2012-03-10 ENCOUNTER — Ambulatory Visit: Payer: Medicare Other | Admitting: Physical Therapy

## 2012-03-10 DIAGNOSIS — IMO0001 Reserved for inherently not codable concepts without codable children: Secondary | ICD-10-CM | POA: Diagnosis not present

## 2012-03-10 DIAGNOSIS — I69998 Other sequelae following unspecified cerebrovascular disease: Secondary | ICD-10-CM | POA: Diagnosis not present

## 2012-03-10 DIAGNOSIS — R269 Unspecified abnormalities of gait and mobility: Secondary | ICD-10-CM | POA: Diagnosis not present

## 2012-03-10 DIAGNOSIS — M6281 Muscle weakness (generalized): Secondary | ICD-10-CM | POA: Diagnosis not present

## 2012-03-11 ENCOUNTER — Ambulatory Visit: Payer: Medicare Other | Admitting: Physical Therapy

## 2012-03-11 DIAGNOSIS — IMO0001 Reserved for inherently not codable concepts without codable children: Secondary | ICD-10-CM | POA: Diagnosis not present

## 2012-03-11 DIAGNOSIS — M6281 Muscle weakness (generalized): Secondary | ICD-10-CM | POA: Diagnosis not present

## 2012-03-11 DIAGNOSIS — I69998 Other sequelae following unspecified cerebrovascular disease: Secondary | ICD-10-CM | POA: Diagnosis not present

## 2012-03-11 DIAGNOSIS — R269 Unspecified abnormalities of gait and mobility: Secondary | ICD-10-CM | POA: Diagnosis not present

## 2012-03-17 ENCOUNTER — Ambulatory Visit: Payer: Medicare Other | Admitting: Physical Therapy

## 2012-03-17 DIAGNOSIS — I69998 Other sequelae following unspecified cerebrovascular disease: Secondary | ICD-10-CM | POA: Diagnosis not present

## 2012-03-17 DIAGNOSIS — R269 Unspecified abnormalities of gait and mobility: Secondary | ICD-10-CM | POA: Diagnosis not present

## 2012-03-17 DIAGNOSIS — IMO0001 Reserved for inherently not codable concepts without codable children: Secondary | ICD-10-CM | POA: Diagnosis not present

## 2012-03-17 DIAGNOSIS — M6281 Muscle weakness (generalized): Secondary | ICD-10-CM | POA: Diagnosis not present

## 2012-03-20 ENCOUNTER — Ambulatory Visit: Payer: Medicare Other | Admitting: Physical Therapy

## 2012-03-20 DIAGNOSIS — I872 Venous insufficiency (chronic) (peripheral): Secondary | ICD-10-CM | POA: Diagnosis not present

## 2012-03-20 DIAGNOSIS — L0390 Cellulitis, unspecified: Secondary | ICD-10-CM | POA: Diagnosis not present

## 2012-03-20 DIAGNOSIS — I69998 Other sequelae following unspecified cerebrovascular disease: Secondary | ICD-10-CM | POA: Diagnosis not present

## 2012-03-20 DIAGNOSIS — IMO0001 Reserved for inherently not codable concepts without codable children: Secondary | ICD-10-CM | POA: Diagnosis not present

## 2012-03-20 DIAGNOSIS — M6281 Muscle weakness (generalized): Secondary | ICD-10-CM | POA: Diagnosis not present

## 2012-03-20 DIAGNOSIS — R269 Unspecified abnormalities of gait and mobility: Secondary | ICD-10-CM | POA: Diagnosis not present

## 2012-03-24 ENCOUNTER — Ambulatory Visit: Payer: Medicare Other | Attending: Internal Medicine | Admitting: Physical Therapy

## 2012-03-24 DIAGNOSIS — M6281 Muscle weakness (generalized): Secondary | ICD-10-CM | POA: Insufficient documentation

## 2012-03-24 DIAGNOSIS — IMO0001 Reserved for inherently not codable concepts without codable children: Secondary | ICD-10-CM | POA: Insufficient documentation

## 2012-03-24 DIAGNOSIS — R269 Unspecified abnormalities of gait and mobility: Secondary | ICD-10-CM | POA: Insufficient documentation

## 2012-03-24 DIAGNOSIS — I69998 Other sequelae following unspecified cerebrovascular disease: Secondary | ICD-10-CM | POA: Insufficient documentation

## 2012-03-27 ENCOUNTER — Ambulatory Visit: Payer: Medicare Other | Admitting: Physical Therapy

## 2012-03-27 DIAGNOSIS — M6281 Muscle weakness (generalized): Secondary | ICD-10-CM | POA: Diagnosis not present

## 2012-03-27 DIAGNOSIS — IMO0001 Reserved for inherently not codable concepts without codable children: Secondary | ICD-10-CM | POA: Diagnosis not present

## 2012-03-27 DIAGNOSIS — R269 Unspecified abnormalities of gait and mobility: Secondary | ICD-10-CM | POA: Diagnosis not present

## 2012-03-27 DIAGNOSIS — I69998 Other sequelae following unspecified cerebrovascular disease: Secondary | ICD-10-CM | POA: Diagnosis not present

## 2012-03-28 ENCOUNTER — Other Ambulatory Visit: Payer: Self-pay | Admitting: *Deleted

## 2012-03-28 MED ORDER — TIZANIDINE HCL 2 MG PO TABS: 2.0000 mg | ORAL_TABLET | Freq: Two times a day (BID) | ORAL | Status: DC

## 2012-03-31 ENCOUNTER — Ambulatory Visit: Payer: Medicare Other | Admitting: Physical Therapy

## 2012-03-31 DIAGNOSIS — I69998 Other sequelae following unspecified cerebrovascular disease: Secondary | ICD-10-CM | POA: Diagnosis not present

## 2012-03-31 DIAGNOSIS — N529 Male erectile dysfunction, unspecified: Secondary | ICD-10-CM | POA: Diagnosis not present

## 2012-03-31 DIAGNOSIS — I69959 Hemiplegia and hemiparesis following unspecified cerebrovascular disease affecting unspecified side: Secondary | ICD-10-CM | POA: Diagnosis not present

## 2012-03-31 DIAGNOSIS — IMO0001 Reserved for inherently not codable concepts without codable children: Secondary | ICD-10-CM | POA: Diagnosis not present

## 2012-03-31 DIAGNOSIS — R269 Unspecified abnormalities of gait and mobility: Secondary | ICD-10-CM | POA: Diagnosis not present

## 2012-03-31 DIAGNOSIS — I25810 Atherosclerosis of coronary artery bypass graft(s) without angina pectoris: Secondary | ICD-10-CM | POA: Diagnosis not present

## 2012-03-31 DIAGNOSIS — I10 Essential (primary) hypertension: Secondary | ICD-10-CM | POA: Diagnosis not present

## 2012-03-31 DIAGNOSIS — M6281 Muscle weakness (generalized): Secondary | ICD-10-CM | POA: Diagnosis not present

## 2012-03-31 DIAGNOSIS — I70219 Atherosclerosis of native arteries of extremities with intermittent claudication, unspecified extremity: Secondary | ICD-10-CM | POA: Diagnosis not present

## 2012-04-03 ENCOUNTER — Ambulatory Visit: Payer: Medicare Other | Admitting: Physical Therapy

## 2012-04-03 DIAGNOSIS — M6281 Muscle weakness (generalized): Secondary | ICD-10-CM | POA: Diagnosis not present

## 2012-04-03 DIAGNOSIS — R269 Unspecified abnormalities of gait and mobility: Secondary | ICD-10-CM | POA: Diagnosis not present

## 2012-04-03 DIAGNOSIS — I69998 Other sequelae following unspecified cerebrovascular disease: Secondary | ICD-10-CM | POA: Diagnosis not present

## 2012-04-03 DIAGNOSIS — IMO0001 Reserved for inherently not codable concepts without codable children: Secondary | ICD-10-CM | POA: Diagnosis not present

## 2012-04-08 ENCOUNTER — Ambulatory Visit: Payer: Medicare Other | Admitting: Physical Therapy

## 2012-04-08 DIAGNOSIS — I69998 Other sequelae following unspecified cerebrovascular disease: Secondary | ICD-10-CM | POA: Diagnosis not present

## 2012-04-08 DIAGNOSIS — IMO0001 Reserved for inherently not codable concepts without codable children: Secondary | ICD-10-CM | POA: Diagnosis not present

## 2012-04-08 DIAGNOSIS — R269 Unspecified abnormalities of gait and mobility: Secondary | ICD-10-CM | POA: Diagnosis not present

## 2012-04-08 DIAGNOSIS — M6281 Muscle weakness (generalized): Secondary | ICD-10-CM | POA: Diagnosis not present

## 2012-04-10 ENCOUNTER — Ambulatory Visit: Payer: Medicare Other | Admitting: Physical Therapy

## 2012-04-10 DIAGNOSIS — R269 Unspecified abnormalities of gait and mobility: Secondary | ICD-10-CM | POA: Diagnosis not present

## 2012-04-10 DIAGNOSIS — IMO0001 Reserved for inherently not codable concepts without codable children: Secondary | ICD-10-CM | POA: Diagnosis not present

## 2012-04-10 DIAGNOSIS — I69998 Other sequelae following unspecified cerebrovascular disease: Secondary | ICD-10-CM | POA: Diagnosis not present

## 2012-04-10 DIAGNOSIS — M6281 Muscle weakness (generalized): Secondary | ICD-10-CM | POA: Diagnosis not present

## 2012-04-15 ENCOUNTER — Ambulatory Visit: Payer: Medicare Other | Admitting: Physical Therapy

## 2012-04-15 DIAGNOSIS — I69998 Other sequelae following unspecified cerebrovascular disease: Secondary | ICD-10-CM | POA: Diagnosis not present

## 2012-04-15 DIAGNOSIS — IMO0001 Reserved for inherently not codable concepts without codable children: Secondary | ICD-10-CM | POA: Diagnosis not present

## 2012-04-15 DIAGNOSIS — M6281 Muscle weakness (generalized): Secondary | ICD-10-CM | POA: Diagnosis not present

## 2012-04-15 DIAGNOSIS — R269 Unspecified abnormalities of gait and mobility: Secondary | ICD-10-CM | POA: Diagnosis not present

## 2012-04-17 ENCOUNTER — Ambulatory Visit: Payer: Medicare Other | Admitting: Physical Therapy

## 2012-04-17 DIAGNOSIS — IMO0001 Reserved for inherently not codable concepts without codable children: Secondary | ICD-10-CM | POA: Diagnosis not present

## 2012-04-17 DIAGNOSIS — M6281 Muscle weakness (generalized): Secondary | ICD-10-CM | POA: Diagnosis not present

## 2012-04-17 DIAGNOSIS — I69998 Other sequelae following unspecified cerebrovascular disease: Secondary | ICD-10-CM | POA: Diagnosis not present

## 2012-04-17 DIAGNOSIS — R269 Unspecified abnormalities of gait and mobility: Secondary | ICD-10-CM | POA: Diagnosis not present

## 2012-04-22 ENCOUNTER — Ambulatory Visit: Payer: Medicare Other | Admitting: Physical Therapy

## 2012-04-24 ENCOUNTER — Ambulatory Visit: Payer: Medicare Other | Attending: Internal Medicine | Admitting: Physical Therapy

## 2012-04-24 ENCOUNTER — Ambulatory Visit: Payer: Medicare Other | Admitting: Physical Therapy

## 2012-04-24 DIAGNOSIS — M6281 Muscle weakness (generalized): Secondary | ICD-10-CM | POA: Diagnosis not present

## 2012-04-24 DIAGNOSIS — I69998 Other sequelae following unspecified cerebrovascular disease: Secondary | ICD-10-CM | POA: Diagnosis not present

## 2012-04-24 DIAGNOSIS — R269 Unspecified abnormalities of gait and mobility: Secondary | ICD-10-CM | POA: Diagnosis not present

## 2012-04-24 DIAGNOSIS — IMO0001 Reserved for inherently not codable concepts without codable children: Secondary | ICD-10-CM | POA: Diagnosis not present

## 2012-04-28 ENCOUNTER — Ambulatory Visit: Payer: Medicare Other | Admitting: Physical Therapy

## 2012-05-01 ENCOUNTER — Ambulatory Visit: Payer: Medicare Other | Admitting: Physical Therapy

## 2012-05-06 ENCOUNTER — Ambulatory Visit: Payer: Medicare Other | Admitting: Physical Therapy

## 2012-05-08 ENCOUNTER — Ambulatory Visit: Payer: Medicare Other | Admitting: Physical Therapy

## 2012-05-13 ENCOUNTER — Ambulatory Visit: Payer: Medicare Other | Admitting: Physical Therapy

## 2012-05-15 ENCOUNTER — Ambulatory Visit: Payer: Medicare Other | Admitting: Physical Therapy

## 2012-06-03 ENCOUNTER — Encounter: Payer: Medicare Other | Attending: Physical Medicine & Rehabilitation

## 2012-06-03 ENCOUNTER — Ambulatory Visit (HOSPITAL_BASED_OUTPATIENT_CLINIC_OR_DEPARTMENT_OTHER): Payer: Medicare Other | Admitting: Physical Medicine & Rehabilitation

## 2012-06-03 ENCOUNTER — Encounter: Payer: Self-pay | Admitting: Physical Medicine & Rehabilitation

## 2012-06-03 ENCOUNTER — Ambulatory Visit: Payer: Medicare Other | Admitting: Physical Medicine & Rehabilitation

## 2012-06-03 VITALS — BP 124/71 | HR 77 | Resp 14 | Ht 73.0 in | Wt 271.0 lb

## 2012-06-03 DIAGNOSIS — I69359 Hemiplegia and hemiparesis following cerebral infarction affecting unspecified side: Secondary | ICD-10-CM

## 2012-06-03 DIAGNOSIS — I69959 Hemiplegia and hemiparesis following unspecified cerebrovascular disease affecting unspecified side: Secondary | ICD-10-CM

## 2012-06-03 NOTE — Progress Notes (Signed)
Subjective:    Patient ID: [name], male    DOB: [birth year], 66 y.o.   MRN: [id number]  HPI No new issues in the last 4 months. Spasticity is fairly well controlled. Pain Inventory Average Pain 0 Pain Right Now 0 My pain is intermittent  In the last 24 hours, has pain interfered with the following? General activity 0 Relation with others 0 Enjoyment of life 0 What TIME of day is your pain at its worst? n/a Sleep (in general) Good  Pain is worse with: n/a Pain improves with: n/a Relief from Meds: 0  Mobility walk with assistance use a cane do you drive?  no use a wheelchair transfers alone  Function I need assistance with the following:  dressing and bathing  Neuro/Psych No problems in this area  Prior Studies Any changes since last visit?  no  Physicians involved in your care Any changes since last visit?  no   Family History  Problem Relation Age of Onset  . Heart disease Mother   . Heart disease Father    History   Social History  . Marital Status: Married    Spouse Name: N/A    Number of Children: N/A  . Years of Education: N/A   Social History Main Topics  . Smoking status: Former Smoker -- 40 years  . Smokeless tobacco: None  . Alcohol Use: No  . Drug Use: No  . Sexually Active:    Other Topics Concern  . None   Social History Narrative  . None   Past Surgical History  Procedure Date  . Coronary artery bypass graft   . Femoral bypass   . Cardiac catheterization     stent done feb 2012  . Vascular surgery    Past Medical History  Diagnosis Date  . Coronary artery disease   . Diabetes mellitus   . Stroke feb 2012    complication of cath  . Myocardial infarction 2011  . Gout   . Peripheral vascular disease    BP 124/71  Pulse 77  Resp 14  Ht 6\' 1"  (1.854 m)  Wt 271 lb (122.925 kg)  BMI 35.75 kg/m2  SpO2 95%     Review of Systems  All other systems reviewed and are negative.       Objective:   Physical  Exam  Constitutional: He is oriented to person, place, and time. He appears well-developed.       obese  HENT:  Head: Normocephalic and atraumatic.  Neurological: He is alert and oriented to person, place, and time. He displays abnormal reflex. He exhibits abnormal muscle tone. Coordination and gait abnormal.  Reflex Scores:      Tricep reflexes are 3+ on the right side and 2+ on the left side.      Bicep reflexes are 3+ on the right side and 2+ on the left side.      Brachioradialis reflexes are 3+ on the right side and 2+ on the left side.      Patellar reflexes are 3+ on the right side and 2+ on the left side.      Achilles reflexes are 3+ on the right side and 2+ on the left side.      Ashworth grade 3 spasticity in the biceps, 2 at the wrist and finger flexors Clonus at the pronator Clonus at the wrist and finger flexors  Psychiatric: He has a normal mood and affect.  Assessment & Plan:  1. Right spastic hemiplegia due to CVA, continued has added in. Discussed the role of Botox as well as phenol injections. Patient declines at the present time I'll see him back 6 mo

## 2012-06-03 NOTE — Patient Instructions (Signed)
You'll come back in 6 months. If you're running low on tizanidine please call your pharmacy

## 2012-06-05 DIAGNOSIS — I69959 Hemiplegia and hemiparesis following unspecified cerebrovascular disease affecting unspecified side: Secondary | ICD-10-CM | POA: Diagnosis not present

## 2012-06-05 DIAGNOSIS — E669 Obesity, unspecified: Secondary | ICD-10-CM | POA: Diagnosis not present

## 2012-06-05 DIAGNOSIS — I10 Essential (primary) hypertension: Secondary | ICD-10-CM | POA: Diagnosis not present

## 2012-06-05 DIAGNOSIS — E782 Mixed hyperlipidemia: Secondary | ICD-10-CM | POA: Diagnosis not present

## 2012-06-05 DIAGNOSIS — E119 Type 2 diabetes mellitus without complications: Secondary | ICD-10-CM | POA: Diagnosis not present

## 2012-06-18 DIAGNOSIS — H40019 Open angle with borderline findings, low risk, unspecified eye: Secondary | ICD-10-CM | POA: Diagnosis not present

## 2012-06-18 DIAGNOSIS — H2510 Age-related nuclear cataract, unspecified eye: Secondary | ICD-10-CM | POA: Diagnosis not present

## 2012-06-18 DIAGNOSIS — H25019 Cortical age-related cataract, unspecified eye: Secondary | ICD-10-CM | POA: Diagnosis not present

## 2012-06-18 DIAGNOSIS — H524 Presbyopia: Secondary | ICD-10-CM | POA: Diagnosis not present

## 2012-07-24 DIAGNOSIS — H40019 Open angle with borderline findings, low risk, unspecified eye: Secondary | ICD-10-CM | POA: Diagnosis not present

## 2012-08-19 DIAGNOSIS — H2589 Other age-related cataract: Secondary | ICD-10-CM | POA: Diagnosis not present

## 2012-08-19 DIAGNOSIS — H25019 Cortical age-related cataract, unspecified eye: Secondary | ICD-10-CM | POA: Diagnosis not present

## 2012-08-19 DIAGNOSIS — IMO0002 Reserved for concepts with insufficient information to code with codable children: Secondary | ICD-10-CM | POA: Diagnosis not present

## 2012-08-19 DIAGNOSIS — H2510 Age-related nuclear cataract, unspecified eye: Secondary | ICD-10-CM | POA: Diagnosis not present

## 2012-09-25 ENCOUNTER — Ambulatory Visit: Payer: Medicare Other | Admitting: Neurosurgery

## 2012-10-09 ENCOUNTER — Other Ambulatory Visit: Payer: Self-pay | Admitting: *Deleted

## 2012-10-09 DIAGNOSIS — I739 Peripheral vascular disease, unspecified: Secondary | ICD-10-CM

## 2012-10-09 DIAGNOSIS — Z48812 Encounter for surgical aftercare following surgery on the circulatory system: Secondary | ICD-10-CM

## 2012-10-15 ENCOUNTER — Encounter: Payer: Self-pay | Admitting: Neurosurgery

## 2012-10-16 ENCOUNTER — Ambulatory Visit (INDEPENDENT_AMBULATORY_CARE_PROVIDER_SITE_OTHER): Payer: Medicare Other | Admitting: Vascular Surgery

## 2012-10-16 ENCOUNTER — Ambulatory Visit (INDEPENDENT_AMBULATORY_CARE_PROVIDER_SITE_OTHER): Payer: Medicare Other | Admitting: Neurosurgery

## 2012-10-16 ENCOUNTER — Encounter: Payer: Self-pay | Admitting: Neurosurgery

## 2012-10-16 VITALS — BP 127/85 | HR 61 | Resp 16 | Ht 71.5 in | Wt 271.0 lb

## 2012-10-16 DIAGNOSIS — Z48812 Encounter for surgical aftercare following surgery on the circulatory system: Secondary | ICD-10-CM | POA: Diagnosis not present

## 2012-10-16 DIAGNOSIS — I739 Peripheral vascular disease, unspecified: Secondary | ICD-10-CM

## 2012-10-16 NOTE — Progress Notes (Signed)
Ankle brachial index performed @ [facility] 10/16/2012

## 2012-10-16 NOTE — Progress Notes (Signed)
[facility] P[facility]/PVD Office Note  CC: PVD surveillance Referring Physician: Brabham  History of Present Illness: 66 year old male patient of Dr. Myra Gianotti status post left femoropopliteal bypass graft in 2011. The patient denies claudication, rest pain, open ulcerations on the lower extremities. The patient does have a history of CVA.  Past Medical History  Diagnosis Date  . Coronary artery disease   . Diabetes mellitus   . Stroke feb 2012    complication of cath  . Myocardial infarction 2011  . Gout   . Peripheral vascular disease     ROS: [x]  Positive   [ ]  Denies    General: [ ]  Weight loss, [ ]  Fever, [ ]  chills Neurologic: [ ]  Dizziness, [ ]  Blackouts, [ ]  Seizure [ ]  Stroke, [ ]  "Mini stroke", [ ]  Slurred speech, [ ]  Temporary blindness; [ ]  weakness in arms or legs, [ ]  Hoarseness Cardiac: [ ]  Chest pain/pressure, [ ]  Shortness of breath at rest [ ]  Shortness of breath with exertion, [ ]  Atrial fibrillation or irregular heartbeat Vascular: [ ]  Pain in legs with walking, [ ]  Pain in legs at rest, [ ]  Pain in legs at night,  [ ]  Non-healing ulcer, [ ]  Blood clot in vein/DVT,   Pulmonary: [ ]  Home oxygen, [ ]  Productive cough, [ ]  Coughing up blood, [ ]  Asthma,  [ ]  Wheezing Musculoskeletal:  [ ]  Arthritis, [ ]  Low back pain, [ ]  Joint pain Hematologic: [ ]  Easy Bruising, [ ]  Anemia; [ ]  Hepatitis Gastrointestinal: [ ]  Blood in stool, [ ]  Gastroesophageal Reflux/heartburn, [ ]  Trouble swallowing Urinary: [ ]  chronic Kidney disease, [ ]  on HD - [ ]  MWF or [ ]  TTHS, [ ]  Burning with urination, [ ]  Difficulty urinating Skin: [ ]  Rashes, [ ]  Wounds Psychological: [ ]  Anxiety, [ ]  Depression   Social History History  Substance Use Topics  . Smoking status: Former Smoker -- 40 years  . Smokeless tobacco: Not on file  . Alcohol Use: No    Family History Family History  Problem Relation Age of Onset  . Heart disease Mother   . Heart disease  Father     No Known Allergies  Current Outpatient Prescriptions  Medication Sig Dispense Refill  . allopurinol (ZYLOPRIM) 100 MG tablet Take 100 mg by mouth daily.      Marland Kitchen aspirin 325 MG tablet Take 325 mg by mouth daily.      Marland Kitchen atenolol (TENORMIN) 50 MG tablet Take 25 mg by mouth 2 (two) times daily.       Marland Kitchen atorvastatin (LIPITOR) 20 MG tablet Take 20 mg by mouth daily.      . clopidogrel (PLAVIX) 75 MG tablet Take 75 mg by mouth daily.      Marland Kitchen glipiZIDE (GLUCOTROL) 5 MG tablet Take 2.5 mg by mouth 2 (two) times daily before a meal.       . tiZANidine (ZANAFLEX) 2 MG tablet Take 1 tablet (2 mg total) by mouth 2 (two) times daily. On hold for 10 days while taking antibiotics  60 tablet  2  . isosorbide mononitrate (IMDUR) 60 MG 24 hr tablet Take 30 mg by mouth 2 (two) times daily.         Physical Examination  Filed Vitals:   10/16/12 1523  BP: 127/85  Pulse: 61  Resp: 16    Body mass index is 37.27 kg/(m^2).  General:  WDWN in NAD Gait: Normal HEENT: WNL  Eyes: Pupils equal Pulmonary: normal non-labored breathing , without Rales, rhonchi,  wheezing Cardiac: RRR, without  Murmurs, rubs or gallops; No carotid bruits Abdomen: soft, NT, no masses Skin: no rashes, ulcers noted Vascular Exam/Pulses: Palpable DP pulses bilaterally  Extremities without ischemic changes, no Gangrene , no cellulitis; no open wounds;  Musculoskeletal: no muscle wasting or atrophy  Neurologic: A&O X 3; Appropriate Affect ; SENSATION: normal; MOTOR FUNCTION:  moving all extremities equally. Speech is fluent/normal  Non-Invasive Vascular Imaging: ABIs today are 1.0 and triphasic to biphasic on the right, 1.18 and triphasic on the left which is consistent with one year ago. The patient also has a patent left femoropopliteal bypass without elevation in velocity.  ASSESSMENT/PLAN: This is a patient with no claudication or rest pain, the patient will followup in one year with repeat bypass graft duplex and  ABIs. The patient's questions were encouraged and answered, he is in agreement with this plan.  Lauree Chandler ANP  Clinic M.D.: [place]

## 2012-10-17 ENCOUNTER — Other Ambulatory Visit: Payer: Self-pay | Admitting: Physical Medicine & Rehabilitation

## 2012-10-17 DIAGNOSIS — L908 Other atrophic disorders of skin: Secondary | ICD-10-CM | POA: Diagnosis not present

## 2012-10-17 DIAGNOSIS — H02839 Dermatochalasis of unspecified eye, unspecified eyelid: Secondary | ICD-10-CM | POA: Diagnosis not present

## 2012-10-17 DIAGNOSIS — H04129 Dry eye syndrome of unspecified lacrimal gland: Secondary | ICD-10-CM | POA: Diagnosis not present

## 2012-10-17 DIAGNOSIS — H11439 Conjunctival hyperemia, unspecified eye: Secondary | ICD-10-CM | POA: Diagnosis not present

## 2012-10-17 DIAGNOSIS — H01029 Squamous blepharitis unspecified eye, unspecified eyelid: Secondary | ICD-10-CM | POA: Diagnosis not present

## 2012-10-17 NOTE — Addendum Note (Signed)
Addended by: Sharee Pimple on: 10/17/2012 08:25 AM   Modules accepted: Orders

## 2012-10-21 DIAGNOSIS — E119 Type 2 diabetes mellitus without complications: Secondary | ICD-10-CM | POA: Diagnosis not present

## 2012-10-21 DIAGNOSIS — I10 Essential (primary) hypertension: Secondary | ICD-10-CM | POA: Diagnosis not present

## 2012-10-21 DIAGNOSIS — M109 Gout, unspecified: Secondary | ICD-10-CM | POA: Diagnosis not present

## 2012-10-21 DIAGNOSIS — E782 Mixed hyperlipidemia: Secondary | ICD-10-CM | POA: Diagnosis not present

## 2012-10-21 DIAGNOSIS — I2510 Atherosclerotic heart disease of native coronary artery without angina pectoris: Secondary | ICD-10-CM | POA: Diagnosis not present

## 2012-10-21 DIAGNOSIS — I69959 Hemiplegia and hemiparesis following unspecified cerebrovascular disease affecting unspecified side: Secondary | ICD-10-CM | POA: Diagnosis not present

## 2012-12-01 ENCOUNTER — Encounter: Payer: Self-pay | Admitting: Physical Medicine & Rehabilitation

## 2012-12-01 ENCOUNTER — Ambulatory Visit (HOSPITAL_BASED_OUTPATIENT_CLINIC_OR_DEPARTMENT_OTHER): Payer: Medicare Other | Admitting: Physical Medicine & Rehabilitation

## 2012-12-01 ENCOUNTER — Encounter: Payer: Medicare Other | Attending: Physical Medicine & Rehabilitation

## 2012-12-01 VITALS — BP 153/77 | HR 75 | Resp 14 | Ht 70.0 in | Wt 284.0 lb

## 2012-12-01 DIAGNOSIS — Z951 Presence of aortocoronary bypass graft: Secondary | ICD-10-CM | POA: Insufficient documentation

## 2012-12-01 DIAGNOSIS — M109 Gout, unspecified: Secondary | ICD-10-CM | POA: Insufficient documentation

## 2012-12-01 DIAGNOSIS — I739 Peripheral vascular disease, unspecified: Secondary | ICD-10-CM | POA: Diagnosis not present

## 2012-12-01 DIAGNOSIS — E119 Type 2 diabetes mellitus without complications: Secondary | ICD-10-CM | POA: Insufficient documentation

## 2012-12-01 DIAGNOSIS — I252 Old myocardial infarction: Secondary | ICD-10-CM | POA: Diagnosis not present

## 2012-12-01 DIAGNOSIS — I69359 Hemiplegia and hemiparesis following cerebral infarction affecting unspecified side: Secondary | ICD-10-CM

## 2012-12-01 DIAGNOSIS — I2510 Atherosclerotic heart disease of native coronary artery without angina pectoris: Secondary | ICD-10-CM | POA: Insufficient documentation

## 2012-12-01 DIAGNOSIS — I69959 Hemiplegia and hemiparesis following unspecified cerebrovascular disease affecting unspecified side: Secondary | ICD-10-CM

## 2012-12-01 NOTE — Progress Notes (Signed)
Subjective:    Patient ID: [name], male    DOB: 26-Apr-1946, 66 y.o.   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 Able to take a few steps without a cane. Otherwise uses a cane with 4 points No new medical issues since last visit 6 months ago Reviewed the [facility] followup  Pain Inventory Average Pain 0 Pain Right Now 0 My pain is n/a  In the last 24 hours, has pain interfered with the following? General activity 0 Relation with others 0 Enjoyment of life 0 What TIME of day is your pain at its worst? n/a Sleep (in general) Good  Pain is worse with: n/a Pain improves with: n/a Relief from Meds: n/a  Mobility walk with assistance use a cane ability to climb steps?  yes do you drive?  no use a wheelchair  Function disabled: date disabled  I need assistance with the following:  dressing, bathing and meal prep  Neuro/Psych No problems in this area  Prior Studies Any changes since last visit?  no  Physicians involved in your care Any changes since last visit?  no   Family History  Problem Relation Age of Onset  . Heart disease Mother   . Heart disease Father    History   Social History  . Marital Status: Married    Spouse Name: N/A    Number of Children: N/A  . Years of Education: N/A   Social History Main Topics  . Smoking status: Former Smoker -- 40 years  . Smokeless tobacco: None  . Alcohol Use: No  . Drug Use: No  . Sexually Active:    Other Topics Concern  . None   Social History Narrative  . None   Past Surgical History  Procedure Date  . Coronary artery bypass graft   . Femoral bypass   . Cardiac catheterization     stent done feb 2012  . Vascular surgery    Past Medical History  Diagnosis Date  . Coronary artery disease   . Diabetes mellitus   . Stroke feb 2012    complication of cath  . Myocardial infarction 2011  . Gout   . Peripheral vascular disease    BP 153/77  Pulse 75  Resp 14  Ht 5\' 10"   (1.778 m)  Wt 284 lb (128.822 kg)  BMI 40.75 kg/m2  SpO2 97%     Review of Systems  All other systems reviewed and are negative.       Objective:   Physical Exam        Assessment & Plan:   Subjective:    Patient ID: [name], male    DOB: 1946-02-08, 66 y.o.   MRN: [id number]  HPI No new issues in the last 4 months. Spasticity is fairly well controlled. Pain Inventory Average Pain 0 Pain Right Now 0 My pain is intermittent  In the last 24 hours, has pain interfered with the following? General activity 0 Relation with others 0 Enjoyment of life 0 What TIME of day is your pain at its worst? n/a Sleep (in general) Good  Pain is worse with: n/a Pain improves with: n/a Relief from Meds: 0  Mobility walk with assistance use a cane do you drive?  no use a wheelchair transfers alone  Function I need assistance with the following:  dressing and bathing  Neuro/Psych No problems in this area  Prior Studies Any changes since last visit?  no  Physicians involved in your care Any changes since last visit?  no   Family History  Problem Relation Age of Onset  . Heart disease Mother   . Heart disease Father    History   Social History  . Marital Status: Married    Spouse Name: N/A    Number of Children: N/A  . Years of Education: N/A   Social History Main Topics  . Smoking status: Former Smoker -- 40 years  . Smokeless tobacco: None  . Alcohol Use: No  . Drug Use: No  . Sexually Active:    Other Topics Concern  . None   Social History Narrative  . None   Past Surgical History  Procedure Date  . Coronary artery bypass graft   . Femoral bypass   . Cardiac catheterization     stent done feb 2012  . Vascular surgery    Past Medical History  Diagnosis Date  . Coronary artery disease   . Diabetes mellitus   . Stroke feb 2012    complication of cath  . Myocardial infarction 2011  . Gout   . Peripheral vascular disease    BP  124/71  Pulse 77  Resp 14  Ht 6\' 1"  (1.854 m)  Wt 271 lb (122.925 kg)  BMI 35.75 kg/m2  SpO2 95%     Review of Systems  All other systems reviewed and are negative.       Objective:   Physical Exam  Constitutional: He is oriented to person, place, and time. He appears well-developed.       obese  HENT:  Head: Normocephalic and atraumatic.  Neurological: He is alert and oriented to person, place, and time. He displays abnormal reflex. He exhibits abnormal muscle tone. Coordination and gait abnormal.  Reflex Scores:      Tricep reflexes are 3+ on the right side and 2+ on the left side.      Bicep reflexes are 3+ on the right side and 2+ on the left side.      Brachioradialis reflexes are 3+ on the right side and 2+ on the left side.      Patellar reflexes are 3+ on the right side and 2+ on the left side.      Achilles reflexes are 3+ on the right side and 2+ on the left side.      Ashworth grade 3 spasticity in the biceps, 2 at the wrist and finger flexors Clonus at the pronator Clonus at the wrist and finger flexors  Psychiatric: He has a normal mood and affect.   Strength is 3 minus at the right deltoid, biceps, triceps, grip finger flexors and extensors 3 minus at the hip flexor knee extensor and 2 minus at the right ankle dorsiflexor and plantar flexor. Left side is 5/5       Assessment & Plan:  1. Right spastic hemiplegia due to CVA, I'll see him back 6 mo Info regarding physical therapy studies at [facility] As well as [facility] were shared with the patient

## 2013-01-02 DIAGNOSIS — I69959 Hemiplegia and hemiparesis following unspecified cerebrovascular disease affecting unspecified side: Secondary | ICD-10-CM | POA: Diagnosis not present

## 2013-01-02 DIAGNOSIS — F4320 Adjustment disorder, unspecified: Secondary | ICD-10-CM | POA: Diagnosis not present

## 2013-01-02 DIAGNOSIS — Z630 Problems in relationship with spouse or partner: Secondary | ICD-10-CM

## 2013-02-10 DIAGNOSIS — I10 Essential (primary) hypertension: Secondary | ICD-10-CM | POA: Diagnosis not present

## 2013-02-10 DIAGNOSIS — I2510 Atherosclerotic heart disease of native coronary artery without angina pectoris: Secondary | ICD-10-CM | POA: Diagnosis not present

## 2013-02-10 DIAGNOSIS — H02409 Unspecified ptosis of unspecified eyelid: Secondary | ICD-10-CM | POA: Diagnosis not present

## 2013-02-10 DIAGNOSIS — Z01818 Encounter for other preprocedural examination: Secondary | ICD-10-CM | POA: Diagnosis not present

## 2013-02-16 ENCOUNTER — Other Ambulatory Visit: Payer: Self-pay | Admitting: Physical Medicine & Rehabilitation

## 2013-02-16 DIAGNOSIS — H02419 Mechanical ptosis of unspecified eyelid: Secondary | ICD-10-CM | POA: Diagnosis not present

## 2013-02-16 DIAGNOSIS — H02839 Dermatochalasis of unspecified eye, unspecified eyelid: Secondary | ICD-10-CM | POA: Diagnosis not present

## 2013-03-17 DIAGNOSIS — Z79899 Other long term (current) drug therapy: Secondary | ICD-10-CM | POA: Diagnosis not present

## 2013-03-25 DIAGNOSIS — E119 Type 2 diabetes mellitus without complications: Secondary | ICD-10-CM | POA: Diagnosis not present

## 2013-03-25 DIAGNOSIS — H2510 Age-related nuclear cataract, unspecified eye: Secondary | ICD-10-CM | POA: Diagnosis not present

## 2013-03-25 DIAGNOSIS — Z961 Presence of intraocular lens: Secondary | ICD-10-CM | POA: Diagnosis not present

## 2013-06-01 ENCOUNTER — Encounter: Payer: Self-pay | Admitting: Physical Medicine and Rehabilitation

## 2013-06-01 ENCOUNTER — Ambulatory Visit: Payer: Medicare Other | Admitting: Physical Medicine & Rehabilitation

## 2013-06-01 ENCOUNTER — Encounter
Payer: Medicare Other | Attending: Physical Medicine and Rehabilitation | Admitting: Physical Medicine and Rehabilitation

## 2013-06-01 VITALS — BP 162/82 | HR 80 | Resp 16 | Ht 70.0 in | Wt 284.0 lb

## 2013-06-01 DIAGNOSIS — E119 Type 2 diabetes mellitus without complications: Secondary | ICD-10-CM | POA: Diagnosis not present

## 2013-06-01 DIAGNOSIS — M216X9 Other acquired deformities of unspecified foot: Secondary | ICD-10-CM | POA: Insufficient documentation

## 2013-06-01 DIAGNOSIS — Z951 Presence of aortocoronary bypass graft: Secondary | ICD-10-CM | POA: Insufficient documentation

## 2013-06-01 DIAGNOSIS — I2510 Atherosclerotic heart disease of native coronary artery without angina pectoris: Secondary | ICD-10-CM | POA: Insufficient documentation

## 2013-06-01 DIAGNOSIS — I69359 Hemiplegia and hemiparesis following cerebral infarction affecting unspecified side: Secondary | ICD-10-CM

## 2013-06-01 DIAGNOSIS — I739 Peripheral vascular disease, unspecified: Secondary | ICD-10-CM | POA: Insufficient documentation

## 2013-06-01 DIAGNOSIS — I69959 Hemiplegia and hemiparesis following unspecified cerebrovascular disease affecting unspecified side: Secondary | ICD-10-CM

## 2013-06-01 DIAGNOSIS — R269 Unspecified abnormalities of gait and mobility: Secondary | ICD-10-CM | POA: Diagnosis not present

## 2013-06-01 DIAGNOSIS — I252 Old myocardial infarction: Secondary | ICD-10-CM | POA: Diagnosis not present

## 2013-06-01 NOTE — Progress Notes (Signed)
Subjective:    Patient ID: [name], male    DOB: [birth year], 67 y.o.   MRN: [id number]  HPI The patient is a 67 year old male, who presents with a spastic hemiplegia on the right after a CVA in February of 2012.The patient complains about some difficulty with walking and some weakness in his right LE. He states, that he walks with a cane in his house , and with a walker out of his house, today he is in the wheel chair, because he states, that the walk from the elevator to our office is too long. He also does some exercises at home, and he rides his stationary bike 2-3 times a day for 1.5-2 miles.  The patient denies any pain . The patient states, that he would like to be able to walk better, and that he heard aquatic exercising or swimming would be beneficial for his health.  Pain Inventory Average Pain 0 Pain Right Now 0 My pain is no pain  In the last 24 hours, has pain interfered with the following? General activity 0 Relation with others 0 Enjoyment of life 0 What TIME of day is your pain at its worst? no pain Sleep (in general) Good  Pain is worse with: no pain Pain improves with: no pain Relief from Meds: no pain  Mobility walk with assistance use a cane use a walker  Function retired  Neuro/Psych No problems in this area  Prior Studies Any changes since last visit?  no  Physicians involved in your care Any changes since last visit?  no   Family History  Problem Relation Age of Onset  . Heart disease Mother   . Heart disease Father    History   Social History  . Marital Status: Married    Spouse Name: N/A    Number of Children: N/A  . Years of Education: N/A   Social History Main Topics  . Smoking status: Former Smoker -- 40 years  . Smokeless tobacco: Never Used  . Alcohol Use: No  . Drug Use: No  . Sexually Active: None   Other Topics Concern  . None   Social History Narrative  . None   Past Surgical History  Procedure Laterality  Date  . Coronary artery bypass graft    . Femoral bypass    . Cardiac catheterization      stent done feb 2012  . Vascular surgery     Past Medical History  Diagnosis Date  . Coronary artery disease   . Diabetes mellitus   . Stroke feb 2012    complication of cath  . Myocardial infarction 2011  . Gout   . Peripheral vascular disease    BP 162/82  Pulse 80  Resp 16  Ht 5\' 10"  (1.778 m)  Wt 284 lb (128.822 kg)  BMI 40.75 kg/m2  SpO2 96%   Review of Systems  All other systems reviewed and are negative.       Objective:   Physical Exam  Constitutional: He is oriented to person, place, and time. He appears well-developed and well-nourished.  In a wheel chair  HENT:  Head: Normocephalic.  Neck: Neck supple.  Neurological: He is alert and oriented to person, place, and time.  Skin: Skin is warm and dry.  Psychiatric: He has a normal mood and affect.   Symmetric normal motor tone is noted throughout, except right UE muscle tone increased. Normal muscle bulk. Muscle testing reveals 5/5 muscle strength of  the upper extremity, except right triceps 4/5, right intrinsic hand muscles 4-/5 and 5/5 of the lower extremity, except right LE 4/5, tibialis anterior 3+/5 and right peroneus muscles 3-/5. Full range of motion in upper and lower extremities, except restriction in external rotation of right shoulder, and supination of right elbow.  . DTR in the upper and lower extremity are present and symmetric 2+ on the left 3+ on the right. No clonus is noted.  Patient arises from chair with difficulty. Spastic gait, instable without help, but possible.         Assessment & Plan:  1. Right spastic hemiplegia, with right foot drop, wearing AFO, due to CVA, in February 2012 Plan Continue with exercise program as it is safe Prescribed aquatic PT for balance / gait training and strengthening of his right LE. Ordered that a PT has to be in the water with him, for safety. Patient would like  to do aquatic exercises, but I think it would be safer if he has instructed aquatic PT with support first. Showed patient exercises to strengthen his hamstrings and hip muscles in a safe way. Spent 25 minutes with patient face to face. Follow up in 6 month, or earlier if necessary.

## 2013-06-01 NOTE — Patient Instructions (Addendum)
Stay active, continue with your exercise program.

## 2013-06-08 ENCOUNTER — Telehealth: Payer: Self-pay

## 2013-06-08 MED ORDER — TIZANIDINE HCL 2 MG PO TABS: ORAL_TABLET | ORAL | Status: DC

## 2013-06-08 NOTE — Telephone Encounter (Signed)
Patient called requesting tizanadine refill.  Medication refilled patient aware.

## 2013-06-24 DIAGNOSIS — I10 Essential (primary) hypertension: Secondary | ICD-10-CM | POA: Diagnosis not present

## 2013-06-24 DIAGNOSIS — I69959 Hemiplegia and hemiparesis following unspecified cerebrovascular disease affecting unspecified side: Secondary | ICD-10-CM | POA: Diagnosis not present

## 2013-06-24 DIAGNOSIS — I25810 Atherosclerosis of coronary artery bypass graft(s) without angina pectoris: Secondary | ICD-10-CM | POA: Diagnosis not present

## 2013-06-24 DIAGNOSIS — E785 Hyperlipidemia, unspecified: Secondary | ICD-10-CM | POA: Diagnosis not present

## 2013-06-24 DIAGNOSIS — E119 Type 2 diabetes mellitus without complications: Secondary | ICD-10-CM | POA: Diagnosis not present

## 2013-10-09 ENCOUNTER — Other Ambulatory Visit: Payer: Self-pay | Admitting: Physical Medicine and Rehabilitation

## 2013-10-16 ENCOUNTER — Encounter: Payer: Self-pay | Admitting: Family

## 2013-10-19 ENCOUNTER — Ambulatory Visit (HOSPITAL_COMMUNITY)
Admission: RE | Admit: 2013-10-19 | Discharge: 2013-10-19 | Disposition: A | Discharge status: Home / Self Care | Payer: Medicare Other | Source: Ambulatory Visit | Attending: Neurosurgery | Admitting: Neurosurgery

## 2013-10-19 ENCOUNTER — Ambulatory Visit (INDEPENDENT_AMBULATORY_CARE_PROVIDER_SITE_OTHER): Payer: Medicare Other | Admitting: Family

## 2013-10-19 ENCOUNTER — Encounter: Payer: Self-pay | Admitting: Family

## 2013-10-19 ENCOUNTER — Encounter (INDEPENDENT_AMBULATORY_CARE_PROVIDER_SITE_OTHER): Payer: Self-pay

## 2013-10-19 ENCOUNTER — Ambulatory Visit (INDEPENDENT_AMBULATORY_CARE_PROVIDER_SITE_OTHER)
Admission: RE | Admit: 2013-10-19 | Discharge: 2013-10-19 | Disposition: A | Discharge status: Home / Self Care | Payer: Medicare Other | Source: Ambulatory Visit | Attending: Neurosurgery | Admitting: Neurosurgery

## 2013-10-19 VITALS — BP 125/86 | HR 55 | Resp 16 | Ht 72.0 in | Wt 268.0 lb

## 2013-10-19 DIAGNOSIS — Z48812 Encounter for surgical aftercare following surgery on the circulatory system: Secondary | ICD-10-CM

## 2013-10-19 DIAGNOSIS — I739 Peripheral vascular disease, unspecified: Secondary | ICD-10-CM

## 2013-10-19 NOTE — Addendum Note (Signed)
Addended by: Adria Dill L on: 10/19/2013 02:50 PM   Modules accepted: Orders

## 2013-10-19 NOTE — Progress Notes (Signed)
[facility] [place] HISTORY AND PHYSICAL -PAD  History of Present Illness [name] is a 67 y.o. male patient of Dr. Myra Gianotti status post left femoropopliteal bypass graft in 2011.  Had a stroke and MI about 2 years ago which caused right hemiplegia, physical therapy has helped, denies slurred speech, denies amaurosis fugax.   Pt. denies claudication in legs with walking, but does have right knee stiffness since the stroke, denies non healing ulcers on in legs/feet.  Patient denies New Medical or Surgical History.   Pt Diabetic: No, "borderline" Pt smoker: former smoker, quit 1994  Pt meds include: Statin :Yes Betablocker: Yes ASA: Yes Other anticoagulants/antiplatelets: no  Past Medical History  Diagnosis Date  . Coronary artery disease   . Diabetes mellitus   . Stroke feb 2012    complication of cath  . Myocardial infarction 2011  . Gout   . Peripheral vascular disease   . Arthritis     Gout    Social History History  Substance Use Topics  . Smoking status: Former Smoker -- 40 years  . Smokeless tobacco: Never Used  . Alcohol Use: No    Family History Family History  Problem Relation Age of Onset  . Heart disease Mother   . Heart disease Father   . Diabetes Father     Past Surgical History  Procedure Laterality Date  . Coronary artery bypass graft    . Femoral bypass Left Sept. 30, 2011    Left Fem to above knee Pop Artery BPG  . Cardiac catheterization      stent done feb 2012  . Vascular surgery      No Known Allergies  Current Outpatient Prescriptions  Medication Sig Dispense Refill  . allopurinol (ZYLOPRIM) 100 MG tablet Take 100 mg by mouth daily.      Marland Kitchen aspirin 325 MG tablet Take 325 mg by mouth daily.      Marland Kitchen atenolol (TENORMIN) 50 MG tablet Take 25 mg by mouth 2 (two) times daily.       Marland Kitchen atorvastatin (LIPITOR) 20 MG tablet Take 20 mg by mouth daily.      Marland Kitchen glipiZIDE (GLUCOTROL) 5 MG tablet Take 2.5 mg by mouth  2 (two) times daily before a meal.       . tiZANidine (ZANAFLEX) 2 MG tablet TAKE 1 TABLET BY MOUTH TWICE A DAY  60 tablet  3   No current facility-administered medications for this visit.    ROS: [x]  Positive   [ ]  Denies  General:[ ]  Weight loss,  [ ]  Weight gain, [ ]  Fever, [ ]  chills Neurologic: [ ]  Dizziness, [ ]  Blackouts, [ ]  Seizure [ ]  Stroke, [ ]  "Mini stroke", [ ]  Slurred speech, [ ]  Temporary blindness;  [ ] weakness, [ ]  Hoarseness Cardiac: [ ]  Chest pain/pressure, [ ]  Shortness of breath at rest [ ]  Shortness of breath with exertion,  [ ]   Atrial fibrillation or irregular heartbeat Vascular:[ ]  Pain in legs with walking, [ ]  Pain in legs at rest ,[ ]  Pain in legs at night,  [ ]   Non-healing ulcer, [ ]  Blood clot in vein/DVT,   Pulmonary: [ ]  Home oxygen, [ ]   Productive cough, [ ]  Coughing up blood,  [ ]  Asthma,  [ ]  Wheezing Musculoskeletal:  [ ]  Arthritis, [ ]  Low back pain,  [ ]  Joint pain Hematologic:[ ]  Easy Bruising, [ ]  Anemia; [ ]  Hepatitis Gastrointestinal: [ ]  Blood in stool,  [ ]   Gastroesophageal Reflux, [ ]  Trouble swallowing Urinary: [ ]  chronic Kidney disease, [ ]  on HD, [ ]  Burning with urination, [ ]  Frequent urination, [ ]  Difficulty urinating;  Skin: [ ]  Rashes, [ ]  Wounds     Physical Examination  Filed Vitals:   10/19/13 1237  BP: 125/86  Pulse: 55  Resp: 16   Filed Weights   10/19/13 1237  Weight: 268 lb (121.564 kg)   Body mass index is 36.34 kg/(m^2).  General: A&O x 3, WDWN, obese. Gait: in wheelchair, brace on right ankle. Eyes: PERRLA, Pulmonary: CTAB, without wheezes , rales or rhonchi Cardiac: regular Rythm , without murmur.        Carotid Bruits Left Right   Negative Negative  Aorta: not palpable Radial pulses: 2+ palpable and equal.                           VASCULAR EXAM: Extremities without ischemic changes  without Gangrene; without open wounds.                                                                                                           LE Pulses LEFT RIGHT       POPLITEAL  not palpable   not palpable       POSTERIOR TIBIAL  not palpable   not palpable        DORSALIS PEDIS      ANTERIOR TIBIAL  palpable   palpable    Abdomen: soft, NT, no masses. Skin: no rashes, no ulcers noted. Musculoskeletal: no muscle wasting or atrophy.  Neurologic: A&O X 3; Appropriate Affect ; SENSATION: normal; MOTOR FUNCTION:  moving all extremities equally, motor strength 4/5 in right upper and lower extremities, 5.5 in left upper and lower extremities. Speech is fluent/aphasic. CN 2-12 intact except right facial drop with smile and tongue deviates slightly to the right.    Non-Invasive Vascular Imaging: DATE: 10/19/2013 ABI: RIGHT 1.2, Waveforms: biphasic;  LEFT 1.1, Waveforms: biphasic DUPLEX SCAN OF BYPASS: no significant stenosis.  ASSESSMENT: [name] is a 67 y.o. male who is status post left femoropopliteal bypass graft in 2011, presents with asymptomatic patent left arterial graft with no significant stenosis.  Had a stroke 2 hears ago with right hemiparesis remaining, no record of carotid US, will schedule for 1 month, and return in 1 year for ABI's.  PLAN:  I discussed in depth with the patient the nature of atherosclerosis, and emphasized the importance of maximal medical management including strict control of blood pressure, blood glucose, and lipid levels, obtaining regular exercise, and cessation of smoking.  The patient is aware that without maximal medical management the underlying atherosclerotic disease process will progress, limiting the benefit of any interventions. Based on the patient's vascular studies and examination, pt will return to clinic in 1 month for carotid US and in 1 year for ABI's and bilateral LE Duplex.  The patient was given information about PAD including signs, symptoms, treatment, what symptoms should prompt the patient  to seek immediate medical care, and  risk reduction measures to take. The patient was given information about stroke prevention, see attached.  Charisse March, RN, MSN, FNP-C [facility] Phone: [phone]  Clinic MD: Myra Gianotti  10/19/2013 1:05 PM

## 2013-10-19 NOTE — Patient Instructions (Addendum)
Peripheral Vascular Disease Peripheral Vascular Disease (PVD), also called Peripheral Arterial Disease (PAD), is a circulation problem caused by cholesterol (atherosclerotic plaque) deposits in the arteries. PVD commonly occurs in the lower extremities (legs) but it can occur in other areas of the body, such as your arms. The cholesterol buildup in the arteries reduces blood flow which can cause pain and other serious problems. The presence of PVD can place a person at risk for Coronary Artery Disease (CAD).  CAUSES  Causes of PVD can be many. It is usually associated with more than one risk factor such as:   High Cholesterol.  Smoking.  Diabetes.  Lack of exercise or inactivity.  High blood pressure (hypertension).  Obesity.  Family history. SYMPTOMS   When the lower extremities are affected, patients with PVD may experience:  Leg pain with exertion or physical activity. This is called INTERMITTENT CLAUDICATION. This may present as cramping or numbness with physical activity. The location of the pain is associated with the level of blockage. For example, blockage at the abdominal level (distal abdominal aorta) may result in buttock or hip pain. Lower leg arterial blockage may result in calf pain.  As PVD becomes more severe, pain can develop with less physical activity.  In people with severe PVD, leg pain may occur at rest.  Other PVD signs and symptoms:  Leg numbness or weakness.  Coldness in the affected leg or foot, especially when compared to the other leg.  A change in leg color.  Patients with significant PVD are more prone to ulcers or sores on toes, feet or legs. These may take longer to heal or may reoccur. The ulcers or sores can become infected.  If signs and symptoms of PVD are ignored, gangrene may occur. This can result in the loss of toes or loss of an entire limb.  Not all leg pain is related to PVD. Other medical conditions can cause leg pain such  as:  Blood clots (embolism) or Deep Vein Thrombosis.  Inflammation of the blood vessels (vasculitis).  Spinal stenosis. DIAGNOSIS  Diagnosis of PVD can involve several different types of tests. These can include:  Pulse Volume Recording Method (PVR). This test is simple, painless and does not involve the use of X-rays. PVR involves measuring and comparing the blood pressure in the arms and legs. An ABI (Ankle-Brachial Index) is calculated. The normal ratio of blood pressures is 1. As this number becomes smaller, it indicates more severe disease.  < 0.95  indicates significant narrowing in one or more leg vessels.  <0.8 there will usually be pain in the foot, leg or buttock with exercise.  <0.4 will usually have pain in the legs at rest.  <0.25  usually indicates limb threatening PVD.  Doppler detection of pulses in the legs. This test is painless and checks to see if you have a pulses in your legs/feet.  A dye or contrast material (a substance that highlights the blood vessels so they show up on x-ray) may be given to help your caregiver better see the arteries for the following tests. The dye is eliminated from your body by the kidney's. Your caregiver may order blood work to check your kidney function and other laboratory values before the following tests are performed:  Magnetic Resonance Angiography (MRA). An MRA is a picture study of the blood vessels and arteries. The MRA machine uses a large magnet to produce images of the blood vessels.  Computed Tomography Angiography (CTA). A CTA is a   specialized x-ray that looks at how the blood flows in your blood vessels. An IV may be inserted into your arm so contrast dye can be injected.  Angiogram. Is a procedure that uses x-rays to look at your blood vessels. This procedure is minimally invasive, meaning a small incision (cut) is made in your groin. A small tube (catheter) is then inserted into the artery of your groin. The catheter is  guided to the blood vessel or artery your caregiver wants to examine. Contrast dye is injected into the catheter. X-rays are then taken of the blood vessel or artery. After the images are obtained, the catheter is taken out. TREATMENT  Treatment of PVD involves many interventions which may include:  Lifestyle changes:  Quitting smoking.  Exercise.  Following a low fat, low cholesterol diet.  Control of diabetes.  Foot care is very important to the PVD patient. Good foot care can help prevent infection.  Medication:  Cholesterol-lowering medicine.  Blood pressure medicine.  Anti-platelet drugs.  Certain medicines may reduce symptoms of Intermittent Claudication.  Interventional/Surgical options:  Angioplasty. An Angioplasty is a procedure that inflates a balloon in the blocked artery. This opens the blocked artery to improve blood flow.  Stent Implant. A wire mesh tube (stent) is placed in the artery. The stent expands and stays in place, allowing the artery to remain open.  Peripheral Bypass Surgery. This is a surgical procedure that reroutes the blood around a blocked artery to help improve blood flow. This type of procedure may be performed if Angioplasty or stent implants are not an option. SEEK IMMEDIATE MEDICAL CARE IF:   You develop pain or numbness in your arms or legs.  Your arm or leg turns cold, becomes blue in color.  You develop redness, warmth, swelling and pain in your arms or legs. MAKE SURE YOU:   Understand these instructions.  Will watch your condition.  Will get help right away if you are not doing well or get worse. Document Released: 01/17/2005 Document Revised: 03/03/2012 Document Reviewed: 12/14/2008 Exit[employer] Patient Information 2014 [employer].   Stroke Prevention Some medical conditions and behaviors are associated with an increased chance of having a stroke. You may prevent a stroke by making healthy choices and managing medical  conditions. Reduce your risk of having a stroke by:  Staying physically active. Get at least 30 minutes of activity on most or all days.  Not smoking. It may also be helpful to avoid exposure to secondhand smoke.  Limiting alcohol use. Moderate alcohol use is considered to be:  No more than 2 drinks per day for men.  No more than 1 drink per day for nonpregnant women.  Eating healthy foods.  Include 5 or more servings of fruits and vegetables a day.  Certain diets may be prescribed to address high blood pressure, high cholesterol, diabetes, or obesity.  Managing your cholesterol levels.  A low-saturated fat, low-trans fat, low-cholesterol, and high-fiber diet may control cholesterol levels.  Take any prescribed medicines to control cholesterol as directed by your caregiver.  Managing your diabetes.  A controlled-carbohydrate, controlled-sugar diet is recommended to manage diabetes.  Take any prescribed medicines to control diabetes as directed by your caregiver.  Controlling your high blood pressure (hypertension).  A low-salt (sodium), low-saturated fat, low-trans fat, and low-cholesterol diet is recommended to manage high blood pressure.  Take any prescribed medicines to control hypertension as directed by your caregiver.  Maintaining a healthy weight.  A reduced-calorie, low-sodium, low-saturated fat, low-trans   fat, low-cholesterol diet is recommended to manage weight.  Stopping drug abuse.  Avoiding birth control pills.  Talk to your caregiver about the risks of taking birth control pills if you are over 35 years old, smoke, get migraines, or have ever had a blood clot.  Getting evaluated for sleep disorders (sleep apnea).  Talk to your caregiver about getting a sleep evaluation if you snore a lot or have excessive sleepiness.  Taking medicines as directed by your caregiver.  For some people, aspirin or blood thinners (anticoagulants) are helpful in reducing  the risk of forming abnormal blood clots that can lead to stroke. If you have the irregular heart rhythm of atrial fibrillation, you should be on a blood thinner unless there is a good reason you cannot take them.  Understand all your medicine instructions. SEEK IMMEDIATE MEDICAL CARE IF:   You have sudden weakness or numbness of the face, arm, or leg, especially on one side of the body.  You have sudden confusion.  You have trouble speaking (aphasia) or understanding.  You have sudden trouble seeing in one or both eyes.  You have sudden trouble walking.  You have dizziness.  You have a loss of balance or coordination.  You have a sudden, severe headache with no known cause.  You have new chest pain or an irregular heartbeat. Any of these symptoms may represent a serious problem that is an emergency. Do not wait to see if the symptoms will go away. Get medical help right away. Call your local emergency services (911 in [place] Do not drive yourself to the hospital. Document Released: 01/17/2005 Document Revised: 03/03/2012 Document Reviewed: 07/30/2011 [employer] Patient Information 2014 [employer].  

## 2013-11-10 ENCOUNTER — Telehealth: Payer: Self-pay

## 2013-11-10 MED ORDER — ATENOLOL 50 MG PO TABS: 25.0000 mg | ORAL_TABLET | Freq: Two times a day (BID) | ORAL | Status: DC

## 2013-11-10 NOTE — Telephone Encounter (Signed)
Done

## 2013-11-13 ENCOUNTER — Encounter: Payer: Self-pay | Admitting: Family

## 2013-11-16 ENCOUNTER — Ambulatory Visit (HOSPITAL_COMMUNITY)
Admission: RE | Admit: 2013-11-16 | Discharge: 2013-11-16 | Disposition: A | Discharge status: Home / Self Care | Payer: Medicare Other | Source: Ambulatory Visit | Attending: Family | Admitting: Family

## 2013-11-16 ENCOUNTER — Ambulatory Visit (INDEPENDENT_AMBULATORY_CARE_PROVIDER_SITE_OTHER): Payer: Medicare Other | Admitting: Family

## 2013-11-16 ENCOUNTER — Encounter: Payer: Self-pay | Admitting: Family

## 2013-11-16 VITALS — BP 143/84 | HR 59 | Resp 18 | Ht 72.0 in | Wt 268.0 lb

## 2013-11-16 DIAGNOSIS — Z48812 Encounter for surgical aftercare following surgery on the circulatory system: Secondary | ICD-10-CM | POA: Diagnosis not present

## 2013-11-16 DIAGNOSIS — I6529 Occlusion and stenosis of unspecified carotid artery: Secondary | ICD-10-CM

## 2013-11-16 DIAGNOSIS — I739 Peripheral vascular disease, unspecified: Secondary | ICD-10-CM

## 2013-11-16 NOTE — Progress Notes (Signed)
[facility] [place] HISTORY AND PHYSICAL   History of Present Illness [name] is a 67 y.o. male patient of Dr. Myra Gianotti status post left femoropopliteal bypass graft in 2011. He was here last month for LE non invasive vascular studies. He returns today for carotid artery Duplex since he reports that hehad a stroke during his cardiac stent placement in 2012, no further stroke symptoms since 2012. He has right leg and arm residual weakness, has right facial droop. He denies claudication symptoms with walking, he is continuing to work on his rehab at home. He denies non-healing wounds.    Patient denies New Medical or Surgical History.  Pt Diabetic: No, "borderline"  Pt smoker: former smoker, quit 1994  Pt meds include:  Statin :Yes  Betablocker: Yes  ASA: Yes  Other anticoagulants/antiplatelets: no  Past Medical History  Diagnosis Date  . Coronary artery disease   . Diabetes mellitus   . Stroke feb 2012    complication of cath  . Myocardial infarction 2011  . Gout   . Peripheral vascular disease   . Arthritis     Gout    Social History History  Substance Use Topics  . Smoking status: Former Smoker -- 40 years  . Smokeless tobacco: Never Used  . Alcohol Use: No    Family History Family History  Problem Relation Age of Onset  . Heart disease Mother   . Heart disease Father   . Diabetes Father     Past Surgical History  Procedure Laterality Date  . Coronary artery bypass graft    . Femoral bypass Left Sept. 30, 2011    Left Fem to above knee Pop Artery BPG  . Cardiac catheterization      stent done feb 2012  . Vascular surgery      No Known Allergies  Current Outpatient Prescriptions  Medication Sig Dispense Refill  . allopurinol (ZYLOPRIM) 100 MG tablet Take 100 mg by mouth daily.      Marland Kitchen aspirin 325 MG tablet Take 325 mg by mouth daily.      Marland Kitchen atenolol (TENORMIN) 50 MG tablet Take 0.5 tablets (25 mg total) by mouth 2 (two)  times daily.  30 tablet  10  . atorvastatin (LIPITOR) 20 MG tablet Take 20 mg by mouth daily.      Marland Kitchen glipiZIDE (GLUCOTROL) 5 MG tablet Take 2.5 mg by mouth 2 (two) times daily before a meal.       . tiZANidine (ZANAFLEX) 2 MG tablet TAKE 1 TABLET BY MOUTH TWICE A DAY  60 tablet  3   No current facility-administered medications for this visit.    ROS: [x]  Positive   [ ]  Denies  General:[ ]  Weight loss,  [ ]  Weight gain, [ ]  Fever, [ ]  chills Neurologic: [ ]  Dizziness, [ ]  Blackouts, [ ]  Seizure [ ]  Stroke, [ ]  "Mini stroke", [ ]  Slurred speech, [ ]  Temporary blindness;  [ ] weakness, [ ]  Hoarseness Cardiac: [ ]  Chest pain/pressure, [ ]  Shortness of breath at rest [ ]  Shortness of breath with exertion,  [ ]   Atrial fibrillation or irregular heartbeat Vascular:[ ]  Pain in legs with walking, [ ]  Pain in legs at rest ,[ ]  Pain in legs at night,  [ ]   Non-healing ulcer, [ ]  Blood clot in vein/DVT,   Pulmonary: [ ]  Home oxygen, [ ]   Productive cough, [ ]  Coughing up blood,  [ ]  Asthma,  [ ]  Wheezing Musculoskeletal:  [ ]   Arthritis, [ ]  Low back pain,  [ ]  Joint pain Hematologic:[ ]  Easy Bruising, [ ]  Anemia; [ ]  Hepatitis Gastrointestinal: [ ]  Blood in stool,  [ ]  Gastroesophageal Reflux, [ ]  Trouble swallowing Urinary: [ ]  chronic Kidney disease, [ ]  on HD, [ ]  Burning with urination, [ ]  Frequent urination, [ ]  Difficulty urinating;  Skin: [ ]  Rashes, [ ]  Wounds     Physical Examination  Filed Vitals:   11/16/13 1344  BP: 143/84  Pulse: 59  Resp: 18   Filed Weights   11/16/13 1344  Weight: 268 lb (121.564 kg)   Body mass index is 36.34 kg/(m^2).  General: A&O x 3, WDWN, obese.  Gait: in wheelchair, brace on right ankle.  Eyes: PERRLA,  Pulmonary: CTAB, without wheezes , rales or rhonchi  Cardiac: regular Rythm , without murmur.  Carotid Bruits  Left  Right    Negative  Negative   Aorta: not palpable  Radial pulses: 2+ palpable and equal.   VASCULAR EXAM:  Extremities  without ischemic changes  without Gangrene; without open wounds.   LE Pulses  LEFT  RIGHT   POPLITEAL  not palpable  not palpable   POSTERIOR TIBIAL  not palpable  not palpable   DORSALIS PEDIS  ANTERIOR TIBIAL  palpable  palpable    Abdomen: soft, NT, no masses.  Skin: no rashes, no ulcers noted.  Musculoskeletal: no muscle wasting or atrophy.  Neurologic: A&O X 3; Appropriate Affect ; SENSATION: normal; MOTOR FUNCTION: moving all extremities equally, motor strength 4/5 in right upper and lower extremities, 5.5 in left upper and lower extremities.  Speech is fluent/aphasic. CN 2-12 intact except right facial drop with smile and tongue deviates slightly to the right.    Non-Invasive Vascular Imaging: DATE: 11/16/2013 Carotid Duplex: <40% bilateral ICA stenosis.    ASSESSMENT: [name] is a 67 y.o. male who is status post left femoropopliteal bypass graft in 2011, presents with asymptomatic patent left arterial graft with no significant stenosis.  Had a stroke 2 hears ago with right hemiparesis remaining, no record of carotid US, his bilateral ICA's today show <40% stenosis by Duplex.  PLAN:  I discussed in depth with the patient the nature of atherosclerosis, and emphasized the importance of maximal medical management including strict control of blood pressure, blood glucose, and lipid levels, obtaining regular exercise, and continued of smoking.  The patient is aware that without maximal medical management the underlying atherosclerotic disease process will progress, limiting the benefit of any interventions.  Based on the patient's vascular studies and examination, pt will return to clinic in 1 year for ABI's and bilateral LE Duplex.  The patient was given information about PAD including signs, symptoms, treatment, what symptoms should prompt the patient to seek immediate medical care, and risk reduction measures to take.  Charisse March, RN, MSN, FNP-C [facility] Phone: [phone]  Clinic MD: Myra Gianotti  11/16/2013 1:42 PM

## 2013-11-16 NOTE — Patient Instructions (Signed)
Peripheral Vascular Disease Peripheral Vascular Disease (PVD), also called Peripheral Arterial Disease (PAD), is a circulation problem caused by cholesterol (atherosclerotic plaque) deposits in the arteries. PVD commonly occurs in the lower extremities (legs) but it can occur in other areas of the body, such as your arms. The cholesterol buildup in the arteries reduces blood flow which can cause pain and other serious problems. The presence of PVD can place a person at risk for Coronary Artery Disease (CAD).  CAUSES  Causes of PVD can be many. It is usually associated with more than one risk factor such as:   High Cholesterol.  Smoking.  Diabetes.  Lack of exercise or inactivity.  High blood pressure (hypertension).  Obesity.  Family history. SYMPTOMS   When the lower extremities are affected, patients with PVD may experience:  Leg pain with exertion or physical activity. This is called INTERMITTENT CLAUDICATION. This may present as cramping or numbness with physical activity. The location of the pain is associated with the level of blockage. For example, blockage at the abdominal level (distal abdominal aorta) may result in buttock or hip pain. Lower leg arterial blockage may result in calf pain.  As PVD becomes more severe, pain can develop with less physical activity.  In people with severe PVD, leg pain may occur at rest.  Other PVD signs and symptoms:  Leg numbness or weakness.  Coldness in the affected leg or foot, especially when compared to the other leg.  A change in leg color.  Patients with significant PVD are more prone to ulcers or sores on toes, feet or legs. These may take longer to heal or may reoccur. The ulcers or sores can become infected.  If signs and symptoms of PVD are ignored, gangrene may occur. This can result in the loss of toes or loss of an entire limb.  Not all leg pain is related to PVD. Other medical conditions can cause leg pain such  as:  Blood clots (embolism) or Deep Vein Thrombosis.  Inflammation of the blood vessels (vasculitis).  Spinal stenosis. DIAGNOSIS  Diagnosis of PVD can involve several different types of tests. These can include:  Pulse Volume Recording Method (PVR). This test is simple, painless and does not involve the use of X-rays. PVR involves measuring and comparing the blood pressure in the arms and legs. An ABI (Ankle-Brachial Index) is calculated. The normal ratio of blood pressures is 1. As this number becomes smaller, it indicates more severe disease.  < 0.95  indicates significant narrowing in one or more leg vessels.  <0.8 there will usually be pain in the foot, leg or buttock with exercise.  <0.4 will usually have pain in the legs at rest.  <0.25  usually indicates limb threatening PVD.  Doppler detection of pulses in the legs. This test is painless and checks to see if you have a pulses in your legs/feet.  A dye or contrast material (a substance that highlights the blood vessels so they show up on x-ray) may be given to help your caregiver better see the arteries for the following tests. The dye is eliminated from your body by the kidney's. Your caregiver may order blood work to check your kidney function and other laboratory values before the following tests are performed:  Magnetic Resonance Angiography (MRA). An MRA is a picture study of the blood vessels and arteries. The MRA machine uses a large magnet to produce images of the blood vessels.  Computed Tomography Angiography (CTA). A CTA is a   specialized x-ray that looks at how the blood flows in your blood vessels. An IV may be inserted into your arm so contrast dye can be injected.  Angiogram. Is a procedure that uses x-rays to look at your blood vessels. This procedure is minimally invasive, meaning a small incision (cut) is made in your groin. A small tube (catheter) is then inserted into the artery of your groin. The catheter is  guided to the blood vessel or artery your caregiver wants to examine. Contrast dye is injected into the catheter. X-rays are then taken of the blood vessel or artery. After the images are obtained, the catheter is taken out. TREATMENT  Treatment of PVD involves many interventions which may include:  Lifestyle changes:  Quitting smoking.  Exercise.  Following a low fat, low cholesterol diet.  Control of diabetes.  Foot care is very important to the PVD patient. Good foot care can help prevent infection.  Medication:  Cholesterol-lowering medicine.  Blood pressure medicine.  Anti-platelet drugs.  Certain medicines may reduce symptoms of Intermittent Claudication.  Interventional/Surgical options:  Angioplasty. An Angioplasty is a procedure that inflates a balloon in the blocked artery. This opens the blocked artery to improve blood flow.  Stent Implant. A wire mesh tube (stent) is placed in the artery. The stent expands and stays in place, allowing the artery to remain open.  Peripheral Bypass Surgery. This is a surgical procedure that reroutes the blood around a blocked artery to help improve blood flow. This type of procedure may be performed if Angioplasty or stent implants are not an option. SEEK IMMEDIATE MEDICAL CARE IF:   You develop pain or numbness in your arms or legs.  Your arm or leg turns cold, becomes blue in color.  You develop redness, warmth, swelling and pain in your arms or legs. MAKE SURE YOU:   Understand these instructions.  Will watch your condition.  Will get help right away if you are not doing well or get worse. Document Released: 01/17/2005 Document Revised: 03/03/2012 Document Reviewed: 12/14/2008 [employer] Patient Information 2014 [employer].  

## 2013-11-18 NOTE — Addendum Note (Signed)
Addended by: Melodye Ped C on: 11/18/2013 09:06 AM   Modules accepted: Orders

## 2013-11-24 ENCOUNTER — Encounter: Payer: Self-pay | Admitting: Physical Medicine and Rehabilitation

## 2013-11-24 ENCOUNTER — Encounter
Payer: Medicare Other | Attending: Physical Medicine and Rehabilitation | Admitting: Physical Medicine and Rehabilitation

## 2013-11-24 VITALS — BP 138/83 | HR 63 | Resp 14 | Ht 72.0 in | Wt 268.0 lb

## 2013-11-24 DIAGNOSIS — I639 Cerebral infarction, unspecified: Secondary | ICD-10-CM

## 2013-11-24 DIAGNOSIS — M216X9 Other acquired deformities of unspecified foot: Secondary | ICD-10-CM | POA: Diagnosis not present

## 2013-11-24 DIAGNOSIS — I69959 Hemiplegia and hemiparesis following unspecified cerebrovascular disease affecting unspecified side: Secondary | ICD-10-CM | POA: Insufficient documentation

## 2013-11-24 DIAGNOSIS — I6350 Cerebral infarction due to unspecified occlusion or stenosis of unspecified cerebral artery: Secondary | ICD-10-CM

## 2013-11-24 DIAGNOSIS — G8190 Hemiplegia, unspecified affecting unspecified side: Secondary | ICD-10-CM | POA: Diagnosis not present

## 2013-11-24 NOTE — Progress Notes (Signed)
Subjective:    Patient ID: [name], male    DOB: 07-16-1946, 67 y.o.   MRN: [id number]  HPI The patient is a 67 year old male, who presents with a spastic hemiplegia on the right after a CVA in February of 2012.The patient complains about some difficulty with walking and some weakness in his right LE. He states, that he walks with a cane in his house , and with a walker out of his house, today he is in the wheel chair, because he states, that the walk from the elevator to our office is too long.  He also does some exercises at home, and he rides his stationary bike 2-3 times a day for 1.5-2 miles. The patient denies any pain . The patient states, that he would like to be able to walk better, and that he heard aquatic exercising or swimming would be beneficial for his functioning.  Pain Inventory Average Pain 0 Pain Right Now 0 My pain is no pain  In the last 24 hours, has pain interfered with the following? General activity 0 Relation with others 0 Enjoyment of life 0 What TIME of day is your pain at its worst? no pain Sleep (in general) Good  Pain is worse with: no pain Pain improves with: no pain Relief from Meds: no pain  Mobility walk with assistance how many minutes can you walk? 2 ability to climb steps?  yes do you drive?  no  Function I need assistance with the following:  dressing, bathing, meal prep, household duties and shopping  Neuro/Psych No problems in this area  Prior Studies Any changes since last visit?  no  Physicians involved in your care Any changes since last visit?  no   Family History  Problem Relation Age of Onset  . Heart disease Mother   . Heart disease Father   . Diabetes Father    History   Social History  . Marital Status: Married    Spouse Name: N/A    Number of Children: N/A  . Years of Education: N/A   Social History Main Topics  . Smoking status: Former Smoker -- 40 years  . Smokeless tobacco: Never Used  .  Alcohol Use: No  . Drug Use: No  . Sexual Activity: None   Other Topics Concern  . None   Social History Narrative  . None   Past Surgical History  Procedure Laterality Date  . Coronary artery bypass graft    . Femoral bypass Left Sept. 30, 2011    Left Fem to above knee Pop Artery BPG  . Cardiac catheterization      stent done feb 2012  . Vascular surgery     Past Medical History  Diagnosis Date  . Coronary artery disease   . Diabetes mellitus   . Stroke feb 2012    complication of cath  . Myocardial infarction 2011  . Gout   . Peripheral vascular disease   . Arthritis     Gout   BP 138/83  Pulse 63  Resp 14  Ht 6' (1.829 m)  Wt 268 lb (121.564 kg)  BMI 36.34 kg/m2  SpO2 95%    Review of Systems  All other systems reviewed and are negative.       Objective:   Physical Exam Constitutional: He is oriented to person, place, and time. He appears well-developed and well-nourished.  In a wheel chair  HENT:  Head: Normocephalic.  Neck: Neck supple.  Neurological: He is alert and oriented to person, place, and time.  Skin: Skin is warm and dry.  Psychiatric: He has a normal mood and affect.  Symmetric normal motor tone is noted throughout, except right UE muscle tone increased. Normal muscle bulk. Muscle testing reveals 5/5 muscle strength of the upper extremity, except right triceps 4/5, right intrinsic hand muscles 4-/5 and 5/5 of the lower extremity, except right LE 4/5, tibialis anterior 3+/5 and right peroneus muscles 3-/5. Full range of motion in upper and lower extremities, except restriction in external rotation of right shoulder, and supination of right elbow.  .  DTR in the upper and lower extremity are present and symmetric 2+ on the left 3+ on the right. No clonus is noted.  Patient arises from chair with difficulty. Spastic gait, instable without help, but possible        Assessment & Plan:  1. Right spastic hemiplegia, with right foot drop,  wearing AFO, due to CVA, in February 2012  Plan  Continue with exercise program as it is safe, also showed him some exercises to strengthen his right LE  Prescribed aquatic PT for balance / gait training and strengthening of his right LE. Ordered that a PT has to be in the water with him, for safety. Patient would like to do aquatic exercises, but I think it would be safer if he has instructed aquatic PT with support first.If nobody can go into the water with him, then I will order PT on land with balance training and strengthening of his RLE.  Showed patient exercises to strengthen his hamstrings and hip muscles in a safe way.  Spent 25 minutes with patient face to face.  Follow up in 6 month, or earlier if necessary.

## 2013-11-24 NOTE — Patient Instructions (Signed)
Continue with your exercise program, and also do the exercises I showed you today

## 2013-11-25 ENCOUNTER — Other Ambulatory Visit: Payer: Self-pay | Admitting: Physical Medicine and Rehabilitation

## 2014-02-15 ENCOUNTER — Telehealth: Payer: Self-pay | Admitting: Interventional Cardiology

## 2014-02-15 NOTE — Telephone Encounter (Signed)
New message     Pt has a stent--in dentist chair because he broke a tooth---will he need to be premedicated before treatment?

## 2014-02-15 NOTE — Telephone Encounter (Signed)
[facility]Dr [facility] office pt dentist.pt will need an extraction. They would like it in writing that pt does not need prophylaxis, and his risk for general anesthesia.  Dois DavenportSandra rqst fax be sent to [phone]

## 2014-02-22 ENCOUNTER — Other Ambulatory Visit: Payer: Self-pay

## 2014-02-22 MED ORDER — TIZANIDINE HCL 2 MG PO TABS: ORAL_TABLET | ORAL | Status: DC

## 2014-02-25 NOTE — Telephone Encounter (Signed)
Will route to Dr.Smith 

## 2014-02-27 NOTE — Telephone Encounter (Signed)
The patient does not need antibiotic prophylaxis. I clear them to proceed. Avoid epinephrine.

## 2014-03-01 NOTE — Telephone Encounter (Signed)
Given to medical records to fax to (Dr.Benson office)[phone]

## 2014-04-05 ENCOUNTER — Other Ambulatory Visit: Payer: Self-pay

## 2014-05-24 ENCOUNTER — Encounter: Payer: Medicare Other | Attending: Physical Medicine & Rehabilitation

## 2014-05-24 ENCOUNTER — Ambulatory Visit: Payer: Medicare Other | Admitting: Physical Medicine & Rehabilitation

## 2014-06-01 ENCOUNTER — Encounter: Payer: Self-pay | Admitting: Interventional Cardiology

## 2014-06-22 DIAGNOSIS — E119 Type 2 diabetes mellitus without complications: Secondary | ICD-10-CM | POA: Diagnosis not present

## 2014-06-22 DIAGNOSIS — Z1331 Encounter for screening for depression: Secondary | ICD-10-CM | POA: Diagnosis not present

## 2014-06-22 DIAGNOSIS — I252 Old myocardial infarction: Secondary | ICD-10-CM | POA: Diagnosis not present

## 2014-06-22 DIAGNOSIS — N182 Chronic kidney disease, stage 2 (mild): Secondary | ICD-10-CM | POA: Diagnosis not present

## 2014-06-22 DIAGNOSIS — I2510 Atherosclerotic heart disease of native coronary artery without angina pectoris: Secondary | ICD-10-CM | POA: Diagnosis not present

## 2014-06-22 DIAGNOSIS — E782 Mixed hyperlipidemia: Secondary | ICD-10-CM | POA: Diagnosis not present

## 2014-06-22 DIAGNOSIS — I69959 Hemiplegia and hemiparesis following unspecified cerebrovascular disease affecting unspecified side: Secondary | ICD-10-CM | POA: Diagnosis not present

## 2014-06-22 DIAGNOSIS — I10 Essential (primary) hypertension: Secondary | ICD-10-CM | POA: Diagnosis not present

## 2014-06-23 ENCOUNTER — Ambulatory Visit: Payer: PRIVATE HEALTH INSURANCE | Admitting: Interventional Cardiology

## 2014-07-05 ENCOUNTER — Other Ambulatory Visit: Payer: Self-pay | Admitting: Physical Medicine & Rehabilitation

## 2014-07-19 ENCOUNTER — Encounter: Payer: Self-pay | Admitting: Interventional Cardiology

## 2014-07-19 ENCOUNTER — Ambulatory Visit (INDEPENDENT_AMBULATORY_CARE_PROVIDER_SITE_OTHER): Payer: Medicare Other | Admitting: Interventional Cardiology

## 2014-07-19 VITALS — BP 138/85 | HR 65 | Ht 72.0 in | Wt 268.1 lb

## 2014-07-19 DIAGNOSIS — I10 Essential (primary) hypertension: Secondary | ICD-10-CM | POA: Diagnosis not present

## 2014-07-19 DIAGNOSIS — I25810 Atherosclerosis of coronary artery bypass graft(s) without angina pectoris: Secondary | ICD-10-CM | POA: Diagnosis not present

## 2014-07-19 DIAGNOSIS — E1139 Type 2 diabetes mellitus with other diabetic ophthalmic complication: Secondary | ICD-10-CM

## 2014-07-19 DIAGNOSIS — I739 Peripheral vascular disease, unspecified: Secondary | ICD-10-CM | POA: Diagnosis not present

## 2014-07-19 DIAGNOSIS — E1165 Type 2 diabetes mellitus with hyperglycemia: Secondary | ICD-10-CM

## 2014-07-19 DIAGNOSIS — I209 Angina pectoris, unspecified: Secondary | ICD-10-CM | POA: Diagnosis not present

## 2014-07-19 DIAGNOSIS — I6529 Occlusion and stenosis of unspecified carotid artery: Secondary | ICD-10-CM

## 2014-07-19 DIAGNOSIS — I25709 Atherosclerosis of coronary artery bypass graft(s), unspecified, with unspecified angina pectoris: Secondary | ICD-10-CM

## 2014-07-19 MED ORDER — NITROGLYCERIN 0.4 MG SL SUBL: 0.4000 mg | SUBLINGUAL_TABLET | SUBLINGUAL | Status: DC | PRN

## 2014-07-19 NOTE — Patient Instructions (Signed)
Your physician recommends that you continue on your current medications as directed. Please refer to the Current Medication list given to you today.  An Rx for Nitro-Glycerin has been sent to your office  Cal the office if cardiac symptoms develop  Your physician wants you to follow-up in: 1 year with Dr.Smith You will receive a reminder letter in the mail two months in advance. If you don't receive a letter, please call our office to schedule the follow-up appointment.

## 2014-07-19 NOTE — Progress Notes (Signed)
Patient ID: [name], male   DOB: [birth year], 68 y.o.   MRN: [id number]    [address] [place], [place]  [address] Phone: [phone] Fax:  [phone]  Date:  07/19/2014   ID:  [name], DOB August 23, 1946, MRN [id number]  PCP:  Georgann Housekeeper, MD   ASSESSMENT:  1. Coronary atherosclerotic heart disease, stable without angina. Prior history of coronary bypass grafting and development of non-ST elevation microinfarction in 2012, less than one year after bypass and noted to have occlusion of the LIMA to the LAD. LAD DES was placed per 2. Hypertension, controlled 2. Diabetes mellitus 3. Hyperlipidemia 4. Status post brainstem stroke at the time of coronary angiography 2012  PLAN:  1. Continue aggressive rehabilitation. He has made tremendous improvement from the standpoint of his vision and motor skills. Speech is improved. 2. Clinical followup in one year 3. Prescription for nitroglycerin given   SUBJECTIVE: [name] is a 68 y.o. male who denies angina and dyspnea. He has not needed nitroglycerin. He denies orthopnea. He is still limited by the devastating brainstem stroke that he had in 2012 doing coronary angiography. He is now able to stand. He can walk using a cane. He is able to use his right arm although it is weaker than the left arm. His vision has improved. He denies lower extremity swelling. No particular medication side effects although when he exercises he feels somewhat lightheaded and dizzy shortly thereafter. No medication side effects.   Wt Readings from Last 3 Encounters:  07/19/14 268 lb 1.9 oz (121.618 kg)  11/24/13 268 lb (121.564 kg)  11/16/13 268 lb (121.564 kg)     Past Medical History  Diagnosis Date  . Coronary artery disease   . Diabetes mellitus   . Stroke feb 2012    complication of cath  . Myocardial infarction 2011  . Gout   . Peripheral vascular disease   . Arthritis     Gout    Current Outpatient  Prescriptions  Medication Sig Dispense Refill  . allopurinol (ZYLOPRIM) 100 MG tablet Take 100 mg by mouth daily.      Marland Kitchen aspirin 325 MG tablet Take 325 mg by mouth daily.      Marland Kitchen atenolol (TENORMIN) 50 MG tablet Take 0.5 tablets (25 mg total) by mouth 2 (two) times daily.  30 tablet  10  . atorvastatin (LIPITOR) 20 MG tablet Take 20 mg by mouth daily.      Marland Kitchen glipiZIDE (GLUCOTROL) 5 MG tablet Take 2.5 mg by mouth 2 (two) times daily before a meal.       . lisinopril (PRINIVIL,ZESTRIL) 2.5 MG tablet Take 2.5 mg by mouth 2 (two) times daily.      Marland Kitchen tiZANidine (ZANAFLEX) 2 MG tablet TAKE 1 TABLET BY MOUTH TWICE A DAY  60 tablet  0   No current facility-administered medications for this visit.    Allergies:   No Known Allergies  Social History:  The patient  reports that he has quit smoking. He has never used smokeless tobacco. He reports that he does not drink alcohol or use illicit drugs.   ROS:  Please see the history of present illness.   No orthopnea, PND, new neurological symptoms, syncope, recent falls or head trauma, or lung ailments.   All other systems reviewed and negative.   OBJECTIVE: VS:  BP 138/85  Pulse 65  Ht 6' (1.829 m)  Wt 268 lb 1.9 oz (121.618 kg)  BMI 36.36 kg/m2 Well nourished, well developed, in no acute distress, obese HEENT: normal Neck: JVD flat. Carotid bruit absent  Cardiac:  normal S1, S2; RRR; no murmur Lungs:  clear to auscultation bilaterally, no wheezing, rhonchi or rales Abd: soft, nontender, no hepatomegaly Ext: Edema absent. Pulses 2+ Skin: warm and dry Neuro:  CNs 2-12 intact, no focal abnormalities noted  EKG:  Old inferior and anterior infarct. Normal sinus       Signed, Darci NeedleHenry W. B. Genora Arp III, MD 07/19/2014 10:26 AM

## 2014-09-16 ENCOUNTER — Encounter: Payer: Self-pay | Admitting: Physical Medicine & Rehabilitation

## 2014-09-16 ENCOUNTER — Encounter: Payer: Medicare Other | Attending: Physical Medicine & Rehabilitation

## 2014-09-16 ENCOUNTER — Ambulatory Visit (HOSPITAL_BASED_OUTPATIENT_CLINIC_OR_DEPARTMENT_OTHER): Payer: Medicare Other | Admitting: Physical Medicine & Rehabilitation

## 2014-09-16 VITALS — BP 131/56 | HR 65 | Resp 14 | Wt 259.0 lb

## 2014-09-16 DIAGNOSIS — G8110 Spastic hemiplegia affecting unspecified side: Secondary | ICD-10-CM | POA: Diagnosis not present

## 2014-09-16 DIAGNOSIS — I6990 Unspecified sequelae of unspecified cerebrovascular disease: Secondary | ICD-10-CM | POA: Diagnosis present

## 2014-09-16 DIAGNOSIS — I69959 Hemiplegia and hemiparesis following unspecified cerebrovascular disease affecting unspecified side: Secondary | ICD-10-CM | POA: Insufficient documentation

## 2014-09-16 DIAGNOSIS — I6529 Occlusion and stenosis of unspecified carotid artery: Secondary | ICD-10-CM

## 2014-09-16 MED ORDER — TIZANIDINE HCL 2 MG PO TABS: 2.0000 mg | ORAL_TABLET | Freq: Two times a day (BID) | ORAL | Status: DC

## 2014-09-16 NOTE — Patient Instructions (Signed)
Aquatic exercise through [facility]

## 2014-09-16 NOTE — Progress Notes (Signed)
Subjective:    Patient ID: [name], male    DOB: 10-05-1946, 68 y.o.   MRN: [id number]  HPI The patient is a 68 year old male, who presents with a spastic hemiplegia on the right after a CVA in February of 2012. Patient states I will walk again. Amb with cane or walker short distances. It uses a wheelchair for community ambulation.  AFO in poor repair, duct tape  Pain Inventory Average Pain 0 Pain Right Now 0 My pain is no pain  In the last 24 hours, has pain interfered with the following? General activity 0 Relation with others no pain Enjoyment of life 0 What TIME of day is your pain at its worst? no pain Sleep (in general) no pain  Pain is worse with: no pain Pain improves with: no pain Relief from Meds: no pain  Mobility walk with assistance ability to climb steps?  yes do you drive?  no use a wheelchair  Function disabled: date disabled . I need assistance with the following:  bathing  Neuro/Psych trouble walking  Prior Studies Any changes since last visit?  no  Physicians involved in your care Any changes since last visit?  no   Family History  Problem Relation Age of Onset  . Heart disease Mother   . Heart disease Father   . Diabetes Father    History   Social History  . Marital Status: Married    Spouse Name: N/A    Number of Children: N/A  . Years of Education: N/A   Social History Main Topics  . Smoking status: Former Smoker -- 40 years  . Smokeless tobacco: Never Used  . Alcohol Use: No  . Drug Use: No  . Sexual Activity: None   Other Topics Concern  . None   Social History Narrative  . None   Past Surgical History  Procedure Laterality Date  . Coronary artery bypass graft    . Femoral bypass Left Sept. 30, 2011    Left Fem to above knee Pop Artery BPG  . Cardiac catheterization      stent done feb 2012  . Vascular surgery     Past Medical History  Diagnosis Date  . Coronary artery disease   . Diabetes  mellitus   . Stroke feb 2012    complication of cath  . Myocardial infarction 2011  . Gout   . Peripheral vascular disease   . Arthritis     Gout   BP 131/56  Pulse 65  Resp 14  Wt 259 lb (117.482 kg)  SpO2 93%  Opioid Risk Score:   Fall Risk Score: Moderate Fall Risk (6-13 points) (previously educated and given handout)  Review of Systems  Musculoskeletal: Positive for gait problem.  All other systems reviewed and are negative.      Objective:   Physical Exam  Nursing note and vitals reviewed. Constitutional: He is oriented to person, place, and time. He appears well-developed and well-nourished.  HENT:  Head: Normocephalic and atraumatic.  Eyes: Conjunctivae and EOM are normal. Pupils are equal, round, and reactive to light.  Neurological: He is alert and oriented to person, place, and time.  Psychiatric: He has a normal mood and affect.   4/5 right deltoid, biceps , triceps, grip 4 minus in the hip flexor, knee extensor 2 minus to flexors and extensors right foot  5/5 in all groups on the left side   Sensory equal to light touch in both upper  and lower limb  Clonus at the finger flexors. 3+ biceps triceps reflex. Clonus at the right ankle      Assessment & Plan:  1.  Left pontine infarct Right hemiparesis, as discussed with patient unlikely to get any further spontaneous neurologic improvement. He may be able to improve coordination and thereby increased knee control which would in turn help with gait pattern. Further orthotic management may be helpful as well in conjunction with orthotist. Would continue Zanaflex for spasticity. Still has quite a bit of clonus at the ankle and the finger flexors  Return to clinic 3 months

## 2014-09-29 ENCOUNTER — Ambulatory Visit
Payer: Medicare Other | Attending: Physical Medicine & Rehabilitation | Admitting: Rehabilitative and Restorative Service Providers"

## 2014-09-29 DIAGNOSIS — R269 Unspecified abnormalities of gait and mobility: Secondary | ICD-10-CM | POA: Insufficient documentation

## 2014-09-29 DIAGNOSIS — M6281 Muscle weakness (generalized): Secondary | ICD-10-CM | POA: Diagnosis not present

## 2014-09-29 DIAGNOSIS — Z951 Presence of aortocoronary bypass graft: Secondary | ICD-10-CM | POA: Diagnosis not present

## 2014-09-29 DIAGNOSIS — I252 Old myocardial infarction: Secondary | ICD-10-CM | POA: Diagnosis not present

## 2014-09-29 DIAGNOSIS — Z5189 Encounter for other specified aftercare: Secondary | ICD-10-CM | POA: Diagnosis not present

## 2014-09-29 DIAGNOSIS — I69354 Hemiplegia and hemiparesis following cerebral infarction affecting left non-dominant side: Secondary | ICD-10-CM | POA: Insufficient documentation

## 2014-10-01 ENCOUNTER — Ambulatory Visit: Payer: Medicare Other

## 2014-10-01 DIAGNOSIS — Z5189 Encounter for other specified aftercare: Secondary | ICD-10-CM | POA: Diagnosis not present

## 2014-10-04 ENCOUNTER — Telehealth: Payer: Self-pay | Admitting: Rehabilitative and Restorative Service Providers"

## 2014-10-06 ENCOUNTER — Ambulatory Visit: Payer: Medicare Other | Admitting: Rehabilitative and Restorative Service Providers"

## 2014-10-06 DIAGNOSIS — Z5189 Encounter for other specified aftercare: Secondary | ICD-10-CM | POA: Diagnosis not present

## 2014-10-08 ENCOUNTER — Ambulatory Visit: Payer: Medicare Other | Admitting: Rehabilitative and Restorative Service Providers"

## 2014-10-08 ENCOUNTER — Telehealth: Payer: Self-pay | Admitting: *Deleted

## 2014-10-08 DIAGNOSIS — I6309 Cerebral infarction due to thrombosis of other precerebral artery: Secondary | ICD-10-CM

## 2014-10-08 DIAGNOSIS — Z5189 Encounter for other specified aftercare: Secondary | ICD-10-CM | POA: Diagnosis not present

## 2014-10-08 NOTE — Telephone Encounter (Signed)
Per email from OT and Dr Wynn BankerKirsteins, order placed for OT eval

## 2014-10-11 ENCOUNTER — Ambulatory Visit: Payer: Medicare Other | Admitting: Rehabilitative and Restorative Service Providers"

## 2014-10-11 DIAGNOSIS — Z5189 Encounter for other specified aftercare: Secondary | ICD-10-CM | POA: Diagnosis not present

## 2014-10-15 ENCOUNTER — Ambulatory Visit: Payer: Medicare Other | Admitting: Rehabilitative and Restorative Service Providers"

## 2014-10-15 DIAGNOSIS — Z5189 Encounter for other specified aftercare: Secondary | ICD-10-CM | POA: Diagnosis not present

## 2014-10-19 ENCOUNTER — Ambulatory Visit: Payer: Medicare Other | Admitting: Physical Therapy

## 2014-10-19 DIAGNOSIS — Z5189 Encounter for other specified aftercare: Secondary | ICD-10-CM | POA: Diagnosis not present

## 2014-10-21 ENCOUNTER — Ambulatory Visit: Payer: Medicare Other | Admitting: Physical Therapy

## 2014-10-21 DIAGNOSIS — Z5189 Encounter for other specified aftercare: Secondary | ICD-10-CM | POA: Diagnosis not present

## 2014-10-22 ENCOUNTER — Encounter: Payer: Self-pay | Admitting: Family

## 2014-10-25 ENCOUNTER — Ambulatory Visit (INDEPENDENT_AMBULATORY_CARE_PROVIDER_SITE_OTHER): Payer: Medicare Other | Admitting: Family

## 2014-10-25 ENCOUNTER — Encounter: Payer: Self-pay | Admitting: Family

## 2014-10-25 ENCOUNTER — Ambulatory Visit (INDEPENDENT_AMBULATORY_CARE_PROVIDER_SITE_OTHER)
Admission: RE | Admit: 2014-10-25 | Discharge: 2014-10-25 | Disposition: A | Discharge status: Home / Self Care | Payer: Medicare Other | Source: Ambulatory Visit | Attending: Family | Admitting: Family

## 2014-10-25 ENCOUNTER — Ambulatory Visit (HOSPITAL_COMMUNITY)
Admission: RE | Admit: 2014-10-25 | Discharge: 2014-10-25 | Disposition: A | Discharge status: Home / Self Care | Payer: Medicare Other | Source: Ambulatory Visit | Attending: Family | Admitting: Family

## 2014-10-25 VITALS — BP 128/83 | HR 63 | Resp 16 | Ht 72.0 in | Wt 269.0 lb

## 2014-10-25 DIAGNOSIS — I739 Peripheral vascular disease, unspecified: Secondary | ICD-10-CM | POA: Diagnosis not present

## 2014-10-25 DIAGNOSIS — Z48812 Encounter for surgical aftercare following surgery on the circulatory system: Secondary | ICD-10-CM | POA: Diagnosis not present

## 2014-10-25 DIAGNOSIS — I6529 Occlusion and stenosis of unspecified carotid artery: Secondary | ICD-10-CM

## 2014-10-25 NOTE — Patient Instructions (Signed)

## 2014-10-25 NOTE — Progress Notes (Signed)
Established Carotid Patient   History of Present Illness  [name] is a 68 y.o. male patient of Dr. Myra GianottiBrabham who is status post left femoropopliteal bypass graft in 2011.  He returns today for follow up. He reports that hehad a stroke during his cardiac stent placement in 2012, no further stroke symptoms since 2012, carotid Duplex in 2014 revealed minimal bilateral ICA stenosis. He has right leg and arm residual weakness, has right facial droop, he is able to cut grass with his riding lawn mower. He denies claudication symptoms with walking, he is continuing to work on his rehab at home. He denies non-healing wounds. He is exercising daily including 3-10 miles daily on a recumbent bike.  Patient denies New Medical or Surgical History.  Pt Diabetic: diet controlled Pt smoker: former smoker, quit 1994  Pt meds include:  Statin :Yes  Betablocker: Yes  ASA: Yes  Other anticoagulants/antiplatelets: no   Past Medical History  Diagnosis Date  . Coronary artery disease   . Diabetes mellitus   . Stroke feb 2012    complication of cath  . Myocardial infarction 2011  . Gout   . Peripheral vascular disease   . Arthritis     Gout    Social History History  Substance Use Topics  . Smoking status: Former Smoker -- 40 years  . Smokeless tobacco: Never Used  . Alcohol Use: No    Family History Family History  Problem Relation Age of Onset  . Heart disease Mother   . Heart disease Father   . Diabetes Father   . Heart attack Father     Surgical History Past Surgical History  Procedure Laterality Date  . Coronary artery bypass graft    . Femoral bypass Left Sept. 30, 2011    Left Fem to above knee Pop Artery BPG  . Cardiac catheterization      stent done feb 2012  . Vascular surgery      No Known Allergies  Current Outpatient Prescriptions  Medication Sig Dispense Refill  . allopurinol (ZYLOPRIM) 100 MG tablet Take 100 mg by mouth daily.    Marland Kitchen.  aspirin 325 MG tablet Take 325 mg by mouth daily.    Marland Kitchen. atenolol (TENORMIN) 50 MG tablet Take 0.5 tablets (25 mg total) by mouth 2 (two) times daily. 30 tablet 10  . atorvastatin (LIPITOR) 20 MG tablet Take 20 mg by mouth daily.    Marland Kitchen. glipiZIDE (GLUCOTROL) 5 MG tablet Take 2.5 mg by mouth 2 (two) times daily before a meal.     . lisinopril (PRINIVIL,ZESTRIL) 2.5 MG tablet Take 2.5 mg by mouth 2 (two) times daily.    . nitroGLYCERIN (NITROSTAT) 0.4 MG SL tablet Place 1 tablet (0.4 mg total) under the tongue every 5 (five) minutes as needed for chest pain. 25 tablet 3  . tiZANidine (ZANAFLEX) 2 MG tablet Take 1 tablet (2 mg total) by mouth 2 (two) times daily before a meal. 60 tablet 5   No current facility-administered medications for this visit.    Review of Systems : See HPI for pertinent positives and negatives.  Physical Examination  Filed Vitals:   10/25/14 1206  BP: 128/83  Pulse: 63  Resp: 16  Height: 6' (1.829 m)  Weight: 269 lb (122.018 kg)  SpO2: 94%   Body mass index is 36.48 kg/(m^2).  General: A&O x 3, WDWN, obese.  Gait: using rolling walker, weakness in right arm and leg Eyes: PERRLA  Pulmonary: CTAB, without wheezes ,  rales or rhonchi  Cardiac: regular Rhythm, no detected murmur.   Carotid Bruits  Left  Right    Negative  Negative   Aorta: not palpable  Radial pulses: 2+ palpable and equal.   VASCULAR EXAM:  Extremities without ischemic changes  without Gangrene; without open wounds.   LE Pulses  LEFT  RIGHT   POPLITEAL  not palpable  not palpable   POSTERIOR TIBIAL  not palpable  not palpable   DORSALIS PEDIS  ANTERIOR TIBIAL  palpable  palpable    Abdomen: soft, NT, no masses palpated.  Skin: no rashes, no ulcers noted.  Musculoskeletal: no muscle wasting or atrophy.  Neurologic: A&O X 3; Appropriate Affect ; SENSATION: normal; MOTOR FUNCTION: moving all extremities equally, motor strength 4/5 in right upper and lower  extremities, 5/5 in left upper and lower extremities.  Speech is fluent/aphasic. CN 2-12 intact except right facial drop with smile and tongue deviates slightly to the right, right shoulder shrug is less than left.   Non-Invasive Vascular Imaging (10/25/2014): LOWER EXTREMITY ARTERIAL DUPLEX EVALUATION    INDICATION: Peripheral vascular disease    PREVIOUS INTERVENTION(S): Left femoral-popliteal artery bypass graft 09/22/2010.    DUPLEX EXAM:     RIGHT  LEFT   Peak Systolic Velocity (cm/s) Ratio (if abnormal) Waveform  Peak Systolic Velocity (cm/s) Ratio (if abnormal) Waveform     Inflow Artery 123  T     Proximal Anastomosis 57  T     Proximal Graft 86  T     Mid Graft 30  T      Distal Graft 41  T     Distal Anastomosis 105  T     Outflow Artery 103/79/46  T/T/T  0.94/1.12 Today's ABI / TBI 1.07/0.86  1.20/1.01 Previous ABI / TBI (10/19/2013  ) 1.11/1.05    Waveform:    M - Monophasic       B - Biphasic       T - Triphasic  If Ankle Brachial Index (ABI) or Toe Brachial Index (TBI) performed, please see complete report     ADDITIONAL FINDINGS:     IMPRESSION: Patent left femoral to popliteal artery bypass graft, no hemodynamically significant stenosis or plaque visualized.    Compared to the previous exam:  Stable ankle brachial indices since previous study on 10/19/2013.       Assessment: [name] is a 68 y.o. male who is status post left femoropopliteal bypass graft in 2011. Le arterial Duplex today reveals a patent left femoral to popliteal artery bypass graft, no hemodynamically significant stenosis or plaque visualized. Stable/normal ankle brachial indices since previous study on 10/19/2013.   Plan: Follow-up in 1 year with  Left LE arterial Duplex and ABI's.    I discussed in depth with the patient the nature of atherosclerosis, and emphasized the importance of maximal medical management including strict control of blood pressure, blood glucose, and  lipid levels, obtaining regular exercise, and continued cessation of smoking.  The patient is aware that without maximal medical management the underlying atherosclerotic disease process will progress, limiting the benefit of any interventions. The patient was given information about stroke prevention and what symptoms should prompt the patient to seek immediate medical care. Thank you for allowing us to participate in this patient's care.  Charisse MarchSuzanne Nickel, RN, MSN, FNP-C [facility] Office: [phone]  Clinic Physician: Myra GianottiBrabham  10/25/2014 12:23 PM

## 2014-10-25 NOTE — Addendum Note (Signed)
Addended by: Sharee PimpleMCCHESNEY, Bennye Nix K on: 10/25/2014 03:47 PM   Modules accepted: Orders

## 2014-10-26 ENCOUNTER — Ambulatory Visit: Payer: Medicare Other | Attending: Physical Medicine & Rehabilitation | Admitting: Physical Therapy

## 2014-10-26 ENCOUNTER — Encounter: Payer: Self-pay | Admitting: Physical Therapy

## 2014-10-26 DIAGNOSIS — I69354 Hemiplegia and hemiparesis following cerebral infarction affecting left non-dominant side: Secondary | ICD-10-CM | POA: Insufficient documentation

## 2014-10-26 DIAGNOSIS — G8110 Spastic hemiplegia affecting unspecified side: Secondary | ICD-10-CM

## 2014-10-26 DIAGNOSIS — I252 Old myocardial infarction: Secondary | ICD-10-CM | POA: Insufficient documentation

## 2014-10-26 DIAGNOSIS — Z5189 Encounter for other specified aftercare: Secondary | ICD-10-CM | POA: Insufficient documentation

## 2014-10-26 DIAGNOSIS — M6281 Muscle weakness (generalized): Secondary | ICD-10-CM | POA: Insufficient documentation

## 2014-10-26 DIAGNOSIS — Z951 Presence of aortocoronary bypass graft: Secondary | ICD-10-CM | POA: Insufficient documentation

## 2014-10-26 DIAGNOSIS — R269 Unspecified abnormalities of gait and mobility: Secondary | ICD-10-CM | POA: Diagnosis not present

## 2014-10-26 NOTE — Therapy (Signed)
Physical Therapy Treatment  Patient Details  Name: [name] MRN: [id number] Date of Birth: 1946-04-08  Encounter Date: 10/26/2014      PT End of Session - 10/26/14 1254    Visit Number 9   Number of Visits 17   Date for PT Re-Evaluation 10/29/14   PT Start Time 0925   PT Stop Time 1020   PT Time Calculation (min) 55 min   Activity Tolerance Patient tolerated treatment well      Past Medical History  Diagnosis Date  . Coronary artery disease   . Diabetes mellitus   . Stroke feb 2012    complication of cath  . Myocardial infarction 2011  . Gout   . Peripheral vascular disease   . Arthritis     Gout    Past Surgical History  Procedure Laterality Date  . Coronary artery bypass graft    . Femoral bypass Left Sept. 30, 2011    Left Fem to above knee Pop Artery BPG  . Cardiac catheterization      stent done feb 2012  . Vascular surgery      There were no vitals taken for this visit.  Visit Diagnosis:  Abnormality of gait  Spastic hemiplegia affecting nondominant side          Adult PT Treatment/Exercise - 10/26/14 0700    Ambulation/Gait Yes   Ambulation/Gait Assistance --  CGA   Ambulation Distance (Feet) 120 Feet   Assistive device 4-wheeled walker   Gait Pattern Decreased step length - right;Decreased hip/knee flexion - right   Stairs Yes   Stairs Assistance 4: Min guard   Number of Stairs 12   Exercises Knee/Hip   Active Hamstring Stretch --  RLE   Passive Hamstring Stretch 3 reps;30 seconds   Lower Trunk Rotation 30 seconds  both right and left sides   Quadruped Mid Back Stretch 1 rep;30 seconds   Other Seated Lumbar Exercises /adduction x10 reps; bridging with LLE extension x 10 reps:  bringing with marching x 10 reps   Clam 10 reps   Hip ABduction AAROM;Right;1 set  10 reps   Hamstring Curl Limitations 10 reps with mod assist in prone  limited by weakness in R hamstring          Education - 10/26/14 1253    Education  provided Yes   Education Details instructed in frequency of stretches - minimal of 3x/day   Education Details Patient;Spouse   Methods Explanation   Comprehension Verbalized understanding          PT Short Term Goals - 10/26/14 1308    Title Return demo HEP with written cues and wife's assistance   Time 1   Period Weeks   Status Achieved   Title Improve Berg balance test score to >/= 28/56 to reduce fall risk   Time 1   Period Weeks   Status On-going   Title  Incr. gait speed by 0.5 ft/sec with LRAD   Time 1   Period Weeks   Status On-going   Title Negotiate 4 steps x 3 reps iwth 1 handrail and S   Time 1   Period Weeks   Status Achieved            Plan - 10/26/14 1258    Clinical Impression Statement pt. reporting no back pain experienced with doing low back and RLE stretches prior to strengthening exercises:  pt. needs AFO for RLE due to weakness but declines brace at  this time   Pt will benefit from skilled therapeutic intervention in order to improve on the following deficits Decreased strength;Abnormal gait;Decreased balance   Rehab Potential Good   PT Frequency Min 2X/week   PT Duration Other (comment)  5 weeks   PT Treatment/Interventions Gait training;Stair training;Therapeutic activities;Patient/family education;Therapeutic exercise;Balance training;Neuromuscular re-education   PT Plan continue with exercises to strengthen and stretch RLE        Problem List Patient Active Problem List   Diagnosis Date Noted  . Aftercare following surgery of the circulatory system 10/25/2014  . PVD (peripheral vascular disease) 10/25/2014  . Occlusion and stenosis of carotid artery without mention of cerebral infarction 11/16/2013  . Aftercare following surgery of the circulatory system, [facility] 10/19/2013  . Peripheral vascular disease, unspecified 10/16/2012  . Venous insufficiency 02/19/2012  . CVA, old, hemiparesis 02/18/2012  . CAD (coronary artery disease) of  artery bypass graft 02/18/2012  . Cellulitis 02/17/2012  . HTN (hypertension) 02/17/2012  . 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

## 2014-10-28 ENCOUNTER — Ambulatory Visit: Payer: Medicare Other | Admitting: Physical Therapy

## 2014-10-28 ENCOUNTER — Encounter: Payer: Self-pay | Admitting: Physical Therapy

## 2014-10-28 DIAGNOSIS — G8110 Spastic hemiplegia affecting unspecified side: Secondary | ICD-10-CM

## 2014-10-28 DIAGNOSIS — Z5189 Encounter for other specified aftercare: Secondary | ICD-10-CM | POA: Diagnosis not present

## 2014-10-28 DIAGNOSIS — R269 Unspecified abnormalities of gait and mobility: Secondary | ICD-10-CM

## 2014-10-28 NOTE — Therapy (Signed)
Physical Therapy Treatment  Patient Details  Name: [name] MRN: [id number] Date of Birth: 03-Dec-1946  Encounter Date: 10/28/2014      PT End of Session - 10/28/14 1120    Visit Number 10   Number of Visits 17   Date for PT Re-Evaluation 11/27/14   PT Start Time 0932   PT Stop Time 1026   PT Time Calculation (min) 54 min   Activity Tolerance Patient tolerated treatment well      Past Medical History  Diagnosis Date  . Coronary artery disease   . Diabetes mellitus   . Stroke feb 2012    complication of cath  . Myocardial infarction 2011  . Gout   . Peripheral vascular disease   . Arthritis     Gout    Past Surgical History  Procedure Laterality Date  . Coronary artery bypass graft    . Femoral bypass Left Sept. 30, 2011    Left Fem to above knee Pop Artery BPG  . Cardiac catheterization      stent done feb 2012  . Vascular surgery      There were no vitals taken for this visit.  Visit Diagnosis:  Abnormality of gait  Spastic hemiplegia affecting nondominant side          [facility] Adult PT Treatment/Exercise - 10/28/14 1106    Ambulation/Gait   Ambulation/Gait Yes   Gait velocity 1.15  1.0 initial trial   Standardized Balance Assessment   Standardized Balance Assessment Berg Balance Test   Berg Balance Test   Sit to Stand Able to stand without using hands and stabilize independently   Standing Unsupported Able to stand safely 2 minutes   Sitting with Back Unsupported but Feet Supported on Floor or Stool Able to sit safely and securely 2 minutes   Stand to Sit Sits safely with minimal use of hands   Transfers Able to transfer safely, minor use of hands   Standing Unsupported with Eyes Closed Able to stand 10 seconds with supervision   Standing Ubsupported with Feet Together Needs help to attain position but able to stand for 30 seconds with feet together   From Standing, Reach Forward with Outstretched Arm Can reach forward >12 cm safely (5")   From Standing Position, Pick up Object from Floor Able to pick up shoe, needs supervision   From Standing Position, Turn to Look Behind Over each Shoulder Needs supervision when turning   Turn 360 Degrees Needs assistance while turning   Standing Unsupported, Alternately Place Feet on Step/Stool Able to complete >2 steps/needs minimal assist   Standing Unsupported, One Foot in Front Able to take small step independently and hold 30 seconds   Standing on One Leg Tries to lift leg/unable to hold 3 seconds but remains standing independently   Total Score 35   Lumbar Exercises: Stretches   Active Hamstring Stretch 3 reps;20 seconds  with contract/relax   Passive Hamstring Stretch 3 reps   Single Knee to Chest Stretch 3 reps  20 secs hold   Lower Trunk Rotation 30 seconds;2 reps  right side   Piriformis Stretch 30 seconds;2 reps  left hip   Lumbar Exercises: Seated   Other Seated Lumbar Exercises rolling swiss ball forward for lumbar stretch 30 secs hold   Other Seated Lumbar Exercises bridging with marching x 5reps; bridiging with RLE extension x5 reps;  bridging with marching x 5 reps    Knee/Hip Exercises: Stretches   Hip Flexor Stretch 30  seconds   Hip Flexor Stretch Limitations right side   Knee/Hip Exercises: Supine   Heel Slides AROM  10 reps   Bridges --  10 reps   Other Supine Knee Exercises left hip flexion/extension control off mat 10 reps          Education - 11-22-2014 1120    Education Details instructed in need to lie prone for right hip flexor stretching   Education Details Patient;Spouse   Methods Explanation   Comprehension Verbalized understanding          PT Short Term Goals - 11-22-2014 1139    PT SHORT TERM GOAL #2   Title Improve Berg balance test score to >/= 28/56 to reduce fall risk  score 35/56   Status Achieved   PT SHORT TERM GOAL #3   Title  Incr. gait speed by 0.5 ft/sec with LRAD  velocity 1.1 ft/sec with RW   Status Not Met               G-Codes - 11/22/14 1127    Functional Assessment Tool Used Berg balance test and gait velocity   Functional Limitation Mobility: Walking and moving around   Mobility: Walking and Moving Around Current Status [phone]) At least 60 percent but less than 80 percent impaired, limited or restricted   Mobility: Walking and Moving Around Goal Status [phone]) At least 40 percent but less than 60 percent impaired, limited or restricted      Problem List Patient Active Problem List   Diagnosis Date Noted  . Aftercare following surgery of the circulatory system 10/25/2014  . PVD (peripheral vascular disease) 10/25/2014  . Occlusion and stenosis of carotid artery without mention of cerebral infarction 11/16/2013  . Aftercare following surgery of the circulatory system, [place] 10/19/2013  . Peripheral vascular disease, unspecified 10/16/2012  . Venous insufficiency 02/19/2012  . CVA, old, hemiparesis 02/18/2012  . CAD (coronary artery disease) of artery bypass graft 02/18/2012  . Cellulitis 02/17/2012  . HTN (hypertension) 02/17/2012  . 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

## 2014-10-28 NOTE — Therapy (Signed)
Physical Therapy Treatment  Patient Details  Name: [name] MRN: [id number] Date of Birth: 03-Dec-1946  Encounter Date: 10/28/2014      PT End of Session - 10/28/14 1120    Visit Number 10   Number of Visits 17   Date for PT Re-Evaluation 11/27/14   PT Start Time 0932   PT Stop Time 1026   PT Time Calculation (min) 54 min   Activity Tolerance Patient tolerated treatment well      Past Medical History  Diagnosis Date  . Coronary artery disease   . Diabetes mellitus   . Stroke feb 2012    complication of cath  . Myocardial infarction 2011  . Gout   . Peripheral vascular disease   . Arthritis     Gout    Past Surgical History  Procedure Laterality Date  . Coronary artery bypass graft    . Femoral bypass Left Sept. 30, 2011    Left Fem to above knee Pop Artery BPG  . Cardiac catheterization      stent done feb 2012  . Vascular surgery      There were no vitals taken for this visit.  Visit Diagnosis:  Abnormality of gait  Spastic hemiplegia affecting nondominant side          [facility] Adult PT Treatment/Exercise - 10/28/14 1106    Ambulation/Gait   Ambulation/Gait Yes   Gait velocity 1.15  1.0 initial trial   Standardized Balance Assessment   Standardized Balance Assessment Berg Balance Test   Berg Balance Test   Sit to Stand Able to stand without using hands and stabilize independently   Standing Unsupported Able to stand safely 2 minutes   Sitting with Back Unsupported but Feet Supported on Floor or Stool Able to sit safely and securely 2 minutes   Stand to Sit Sits safely with minimal use of hands   Transfers Able to transfer safely, minor use of hands   Standing Unsupported with Eyes Closed Able to stand 10 seconds with supervision   Standing Ubsupported with Feet Together Needs help to attain position but able to stand for 30 seconds with feet together   From Standing, Reach Forward with Outstretched Arm Can reach forward >12 cm safely (5")   From Standing Position, Pick up Object from Floor Able to pick up shoe, needs supervision   From Standing Position, Turn to Look Behind Over each Shoulder Needs supervision when turning   Turn 360 Degrees Needs assistance while turning   Standing Unsupported, Alternately Place Feet on Step/Stool Able to complete >2 steps/needs minimal assist   Standing Unsupported, One Foot in Front Able to take small step independently and hold 30 seconds   Standing on One Leg Tries to lift leg/unable to hold 3 seconds but remains standing independently   Total Score 35   Lumbar Exercises: Stretches   Active Hamstring Stretch 3 reps;20 seconds  with contract/relax   Passive Hamstring Stretch 3 reps   Single Knee to Chest Stretch 3 reps  20 secs hold   Lower Trunk Rotation 30 seconds;2 reps  right side   Piriformis Stretch 30 seconds;2 reps  left hip   Lumbar Exercises: Seated   Other Seated Lumbar Exercises rolling swiss ball forward for lumbar stretch 30 secs hold   Other Seated Lumbar Exercises bridging with marching x 5reps; bridiging with RLE extension x5 reps;  bridging with marching x 5 reps    Knee/Hip Exercises: Stretches   Hip Flexor Stretch 30  seconds   Hip Flexor Stretch Limitations right side   Knee/Hip Exercises: Supine   Heel Slides AROM  10 reps   Bridges --  10 reps   Other Supine Knee Exercises left hip flexion/extension control off mat 10 reps          Education - 10/28/14 1120    Education Details instructed in need to lie prone for right hip flexor stretching   Education Details Patient;Spouse   Methods Explanation   Comprehension Verbalized understanding                G-Codes - 10/28/14 1127    Functional Assessment Tool Used Berg balance test and gait velocity   Functional Limitation Mobility: Walking and moving around   Mobility: Walking and Moving Around Current Status [phone]([id number]) At least 60 percent but less than 80 percent impaired, limited or restricted    Mobility: Walking and Moving Around Goal Status [phone]([id number]) At least 40 percent but less than 60 percent impaired, limited or restricted      Problem List Patient Active Problem List   Diagnosis Date Noted  . Aftercare following surgery of the circulatory system 10/25/2014  . PVD (peripheral vascular disease) 10/25/2014  . Occlusion and stenosis of carotid artery without mention of cerebral infarction 11/16/2013  . Aftercare following surgery of the circulatory system, [facility] 10/19/2013  . Peripheral vascular disease, unspecified 10/16/2012  . Venous insufficiency 02/19/2012  . CVA, old, hemiparesis 02/18/2012  . CAD (coronary artery disease) of artery bypass graft 02/18/2012  . Cellulitis 02/17/2012  . HTN (hypertension) 02/17/2012  . 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

## 2014-11-01 DIAGNOSIS — F39 Unspecified mood [affective] disorder: Secondary | ICD-10-CM | POA: Diagnosis not present

## 2014-11-02 ENCOUNTER — Ambulatory Visit: Payer: Medicare Other | Admitting: Physical Therapy

## 2014-11-04 ENCOUNTER — Ambulatory Visit: Payer: Medicare Other | Admitting: Physical Therapy

## 2014-11-09 DIAGNOSIS — I10 Essential (primary) hypertension: Secondary | ICD-10-CM | POA: Diagnosis not present

## 2014-11-09 DIAGNOSIS — I69359 Hemiplegia and hemiparesis following cerebral infarction affecting unspecified side: Secondary | ICD-10-CM | POA: Diagnosis not present

## 2014-11-09 DIAGNOSIS — E1122 Type 2 diabetes mellitus with diabetic chronic kidney disease: Secondary | ICD-10-CM | POA: Diagnosis not present

## 2014-11-09 DIAGNOSIS — Z125 Encounter for screening for malignant neoplasm of prostate: Secondary | ICD-10-CM | POA: Diagnosis not present

## 2014-11-09 DIAGNOSIS — N182 Chronic kidney disease, stage 2 (mild): Secondary | ICD-10-CM | POA: Diagnosis not present

## 2014-11-09 DIAGNOSIS — E780 Pure hypercholesterolemia: Secondary | ICD-10-CM | POA: Diagnosis not present

## 2014-11-15 ENCOUNTER — Ambulatory Visit: Payer: Medicare Other | Admitting: Physical Therapy

## 2014-11-15 ENCOUNTER — Encounter: Payer: Self-pay | Admitting: Physical Therapy

## 2014-11-15 DIAGNOSIS — Z630 Problems in relationship with spouse or partner: Secondary | ICD-10-CM | POA: Diagnosis not present

## 2014-11-15 DIAGNOSIS — Z5189 Encounter for other specified aftercare: Secondary | ICD-10-CM | POA: Diagnosis not present

## 2014-11-15 DIAGNOSIS — R269 Unspecified abnormalities of gait and mobility: Secondary | ICD-10-CM

## 2014-11-15 DIAGNOSIS — G8110 Spastic hemiplegia affecting unspecified side: Secondary | ICD-10-CM

## 2014-11-15 DIAGNOSIS — F4320 Adjustment disorder, unspecified: Secondary | ICD-10-CM | POA: Diagnosis not present

## 2014-11-15 NOTE — Therapy (Signed)
Physical Therapy Treatment  Patient Details  Name: Craig DeedClarence M Mcneeley MRN: [id number] Date of Birth: 26-Feb-1946  Encounter Date: 11/15/2014      PT End of Session - 11/15/14 1701    Visit Number 11   Number of Visits 17   Date for PT Re-Evaluation 11/27/14   PT Start Time 1015   PT Stop Time 1106   PT Time Calculation (min) 51 min   Activity Tolerance Patient tolerated treatment well      Past Medical History  Diagnosis Date  . Coronary artery disease   . Diabetes mellitus   . Stroke feb 2012    complication of cath  . Myocardial infarction 2011  . Gout   . Peripheral vascular disease   . Arthritis     Gout    Past Surgical History  Procedure Laterality Date  . Coronary artery bypass graft    . Femoral bypass Left Sept. 30, 2011    Left Fem to above knee Pop Artery BPG  . Cardiac catheterization      stent done feb 2012  . Vascular surgery      There were no vitals taken for this visit.  Visit Diagnosis:  Abnormality of gait  Spastic hemiplegia affecting nondominant side      Subjective Assessment - 11/15/14 1305    Symptoms pt. reports taking [phone] steps per day at home according to his FitBit; doing exercises at home   Currently in Pain? No/denies            [facility] Adult [facility] Treatment/Exercise - 11/15/14 1654    Ambulation/Gait   Ambulation/Gait Yes   Ambulation/Gait Assistance 5: Supervision   Ambulation Distance (Feet) 75 Feet   Assistive device 4-wheeled walker   Gait Pattern Decreased dorsiflexion - right   Stairs Yes   Stairs Assistance 4: Min guard   Stair Management Technique Two rails  step by step sequence   Number of Stairs 4   Lumbar Exercises: Stretches   Passive Hamstring Stretch 2 reps;30 seconds  with left foot dorsiflexed   Lower Trunk Rotation 30 seconds   Quadruped Mid Back Stretch 2 reps  sitting back towards heels for lumbar stretch   Lumbar Exercises: Sidelying   Clam 10 reps   Hip Abduction 10 reps  RLE   Knee/Hip Exercises: Seated   Other Seated Knee Exercises right hip extension control exercise off side of mat with 3# weight 10 reps    Other Seated Knee Exercises prone right knee flexion with mod assist.; prone right hip extension with flexed knee with mod assist   Knee/Hip Exercises: Sidelying   Other Sidelying Knee Exercises right hip internal rotation in seated position 10 reps and in supine 10 reps with min. assist for end range of motion   Knee/Hip Exercises: Prone   Hamstring Curl Limitations weakness   Straight Leg Raises AROM   Straight Leg Raises Limitations --  RLE          PT Education - 11/15/14 1701    Education Details continue with HEP - ambulate as much as tolerated   Person(s) Educated Patient;Spouse   Methods Explanation   Comprehension Verbalized understanding              Plan - 11/15/14 1704    Clinical Impression Statement pt. progressing slowly due to severity of weakness in RLE; pt. stated that he felt that RLE was straighter and not internally rotated after treatment session   Pt will benefit from skilled  therapeutic intervention in order to improve on the following deficits Abnormal gait;Decreased endurance;Decreased coordination;Decreased strength;Decreased balance   Rehab Potential Good   PT Frequency 2x / week   PT Duration 2 weeks   PT Treatment/Interventions Gait training;Therapeutic exercise;Patient/family education;Balance training;Stair training;Functional mobility training;Neuromuscular re-education;Therapeutic activities   PT Next Visit Plan continue with ther ex   PT Home Exercise Plan ambulate as much as tolerated   Consulted and Agree with Plan of Care Patient;Family member/caregiver   PT Plan continue with exercises to strengthen and stretch RLE        Problem List Patient Active Problem List   Diagnosis Date Noted  . Aftercare following surgery of the circulatory system 10/25/2014  . PVD (peripheral vascular disease)  10/25/2014  . Occlusion and stenosis of carotid artery without mention of cerebral infarction 11/16/2013  . Aftercare following surgery of the circulatory system, [facility] 10/19/2013  . Peripheral vascular disease, unspecified 10/16/2012  . Venous insufficiency 02/19/2012  . CVA, old, hemiparesis 02/18/2012  . CAD (coronary artery disease) of artery bypass graft 02/18/2012  . Cellulitis 02/17/2012  . HTN (hypertension) 02/17/2012  . 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

## 2014-11-22 ENCOUNTER — Ambulatory Visit: Payer: Medicare Other | Admitting: Physical Therapy

## 2014-11-22 ENCOUNTER — Encounter: Payer: Self-pay | Admitting: Physical Therapy

## 2014-11-22 DIAGNOSIS — Z5189 Encounter for other specified aftercare: Secondary | ICD-10-CM | POA: Diagnosis not present

## 2014-11-22 DIAGNOSIS — G8110 Spastic hemiplegia affecting unspecified side: Secondary | ICD-10-CM

## 2014-11-22 NOTE — Therapy (Signed)
Physical Therapy Treatment  Patient Details  Name: Craig DeedClarence M Gerhold MRN: [id number] Date of Birth: 1946-11-25  Encounter Date: 11/22/2014      PT End of Session - 11/22/14 1428    Visit Number 12   Number of Visits 17   Date for PT Re-Evaluation 11/27/14   PT Start Time 0933   PT Stop Time 1019   PT Time Calculation (min) 46 min      Past Medical History  Diagnosis Date  . Coronary artery disease   . Diabetes mellitus   . Stroke feb 2012    complication of cath  . Myocardial infarction 2011  . Gout   . Peripheral vascular disease   . Arthritis     Gout    Past Surgical History  Procedure Laterality Date  . Coronary artery bypass graft    . Femoral bypass Left Sept. 30, 2011    Left Fem to above knee Pop Artery BPG  . Cardiac catheterization      stent done feb 2012  . Vascular surgery      There were no vitals taken for this visit.  Visit Diagnosis:  Spastic hemiplegia affecting nondominant side      Subjective Assessment - 11/22/14 1410    Symptoms pt. reports doing exercises during TV commercials at home and ambulating with RW at home   Currently in Pain? No/denies            [facility] Adult PT Treatment/Exercise - 11/22/14 1411    Ambulation/Gait   Ambulation/Gait Yes   Ambulation/Gait Assistance 5: Supervision   Ambulation/Gait Assistance Details decreased dorsiflexion on RLE   Ambulation Distance (Feet) 100 Feet   Assistive device 4-wheeled walker   Gait Pattern Decreased step length - left;Decreased dorsiflexion - right   Lumbar Exercises: Stretches   Single Knee to Chest Stretch 3 reps  20 secs hold   Lower Trunk Rotation 30 seconds   Lumbar Exercises: Seated   Long Arc Quad on Chair 10 reps   Knee/Hip Exercises: Machines for Strengthening   Cybex Leg Press bil. LE's 20 reps 75#: RLE only 20 reps 45#   Knee/Hip Exercises: Standing   Forward Step Up 20 reps  RLE   Knee/Hip Exercises: Seated   Other Seated Knee Exercises r knee flexion  seated with min. assist 10 reps RLE   Knee/Hip Exercises: Supine   Bridges 10 reps   Knee/Hip Exercises: Sidelying   Hip ABduction 10 reps   Clams 10 reps RLE   Other Sidelying Knee Exercises right hip abduction in sidelying x 10 reps:   Other Sidelying Knee Exercises Bridging with hip abdct./adduction x 5 reps; Bridging  with RLE extension x 5 reps;  R hip extension control exercise off side of mat x 10 reps   Knee/Hip Exercises: Prone   Hamstring Curl 10 reps  mod to max assist needed   Hamstring Curl Limitations weakness in R hamstrings                Plan - 11/22/14 1430    Clinical Impression Statement pt. continues to make slow gains in mobility and RLE strength; pt. declines use of AFO for RLE but gait deviations would be reduced with use of this orthosis   Pt will benefit from skilled therapeutic intervention in order to improve on the following deficits Abnormal gait;Decreased endurance;Decreased coordination;Decreased strength;Decreased balance   Rehab Potential Good   PT Frequency 2x / week   PT Duration Other (comment)  1  week   PT Treatment/Interventions Gait training;Therapeutic exercise;Patient/family education;Balance training;Stair training;Functional mobility training;Neuromuscular re-education;Therapeutic activities   PT Next Visit Plan check LTG's - renew due to missed appts. and frequency reduced on schedules   Consulted and Agree with Plan of Care Patient;Family member/caregiver   PT Plan continue with exercises to strengthen and stretch RLE        Problem List Patient Active Problem List   Diagnosis Date Noted  . Aftercare following surgery of the circulatory system 10/25/2014  . PVD (peripheral vascular disease) 10/25/2014  . Occlusion and stenosis of carotid artery without mention of cerebral infarction 11/16/2013  . Aftercare following surgery of the circulatory system, [facility] 10/19/2013  . Peripheral vascular disease, unspecified 10/16/2012  .  Venous insufficiency 02/19/2012  . CVA, old, hemiparesis 02/18/2012  . CAD (coronary artery disease) of artery bypass graft 02/18/2012  . Cellulitis 02/17/2012  . HTN (hypertension) 02/17/2012  . 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

## 2014-11-25 ENCOUNTER — Encounter: Payer: Self-pay | Admitting: Occupational Therapy

## 2014-11-25 ENCOUNTER — Ambulatory Visit: Payer: Medicare Other | Attending: Physical Medicine & Rehabilitation | Admitting: Physical Therapy

## 2014-11-25 ENCOUNTER — Ambulatory Visit: Payer: Medicare Other | Admitting: Occupational Therapy

## 2014-11-25 ENCOUNTER — Encounter: Payer: Self-pay | Admitting: Physical Therapy

## 2014-11-25 DIAGNOSIS — I69354 Hemiplegia and hemiparesis following cerebral infarction affecting left non-dominant side: Secondary | ICD-10-CM | POA: Insufficient documentation

## 2014-11-25 DIAGNOSIS — R269 Unspecified abnormalities of gait and mobility: Secondary | ICD-10-CM | POA: Insufficient documentation

## 2014-11-25 DIAGNOSIS — Z5189 Encounter for other specified aftercare: Secondary | ICD-10-CM | POA: Diagnosis not present

## 2014-11-25 DIAGNOSIS — Z951 Presence of aortocoronary bypass graft: Secondary | ICD-10-CM | POA: Insufficient documentation

## 2014-11-25 DIAGNOSIS — M6281 Muscle weakness (generalized): Secondary | ICD-10-CM | POA: Diagnosis not present

## 2014-11-25 DIAGNOSIS — I252 Old myocardial infarction: Secondary | ICD-10-CM | POA: Insufficient documentation

## 2014-11-25 DIAGNOSIS — IMO0002 Reserved for concepts with insufficient information to code with codable children: Secondary | ICD-10-CM

## 2014-11-25 DIAGNOSIS — G8191 Hemiplegia, unspecified affecting right dominant side: Secondary | ICD-10-CM

## 2014-11-25 DIAGNOSIS — R270 Ataxia, unspecified: Secondary | ICD-10-CM

## 2014-11-25 NOTE — Therapy (Signed)
[facility] [address] [place], [place], [address] Phone: [phone]   Fax:  [phone]  Occupational Therapy Evaluation  Patient Details  Name: [name] MRN: [id number] Date of Birth: [birth year]  Encounter Date: 11/25/2014      OT End of Session - 11/25/14 1415    Visit Number 1   Number of Visits 16   Date for OT Re-Evaluation 01/20/15   OT Start Time 0932   OT Stop Time 1014   OT Time Calculation (min) 42 min   Activity Tolerance Patient tolerated treatment well      Past Medical History  Diagnosis Date  . Coronary artery disease   . Diabetes mellitus   . Stroke feb 2012    complication of cath  . Myocardial infarction 2011  . Gout   . Peripheral vascular disease   . Arthritis     Gout    Past Surgical History  Procedure Laterality Date  . Coronary artery bypass graft    . Femoral bypass Left Sept. 30, 2011    Left Fem to above knee Pop Artery BPG  . Cardiac catheterization      stent done feb 2012  . Vascular surgery      There were no vitals taken for this visit.  Visit Diagnosis:  Lack of coordination due to stroke - Plan: Ot plan of care cert/re-cert  Ataxia - Plan: Ot plan of care cert/re-cert  Hemiplegia affecting right dominant side - Plan: Ot plan of care cert/re-cert      Subjective Assessment - 11/25/14 0939    Symptoms "I want my arm to work better"   Pertinent History see epic snapshot   Currently in Pain? No/denies          [facility] - 11/25/14 0001    Assessment   Diagnosis L CVA    Onset Date 12/12/14   Prior Therapy inpatient rehab, home health and outpatient therapies   Balance Screen   Has the patient fallen in the past 6 months Yes   How many times? 2 since 2012   Home  Environment   Family/patient expects to be discharged to: Private residence   Living Arrangements Other(Comment)   Available Help at Discharge Family  Wife and adult daugher   Home Layout Two level   Bathroom Copywriter, advertisinghower/Tub Walk-in Shower   Additional Comments shower seat   Prior Function   Level of Independence Independent with basic ADLs;Independent with homemaking with ambulation   ADL   Eating/Feeding Minimal assistance  cutting; uses L hand   Grooming Modified independent  uses right arm as gross assist   Upper Body Bathing Modified independent   Lower Body Bathing Modified independent   Upper Body Dressing Minimal assistance  needs assist for buttoning, zipping and tying   Lower Body Dressing Minimal assistance  needs assist with buttoning, zipping and tying   Toilet Tranfer Modified independent  uses RW   Toileting - Clothing Manipulation Modified independent   Toileting -  Hygiene Modified Independent   Manufacturing engineerTub/Shower Transfer Min guard   Tub/Shower Transfer Equipment Shower seat with back   Mobility   Mobility Status Independent  uses RW and occassionally uses wc in community   Written Expression   Dominant Hand Right   Vision Assessment   Alignment/Gaze Preference --  mild malalignment between eyes.     Diplopia Assessment --  initially had diplopia but has resolved.   Activity Tolerance   Activity Tolerance Tolerates <  min activity, no significant change in vital signs  10 minutes then needs a rest break   Cognition   Memory Impaired   Memory Impairment --  per wife more difficulty remembering new information    Sensation   Light Touch Appears Intact   Hot/Cold Appears Intact  pt states mild hypersensitivity to heat   Coordination   Gross Motor Movements are Fluid and Coordinated No   Fine Motor Movements are Fluid and Coordinated No   Box and Blocks R= 24   Other 9 hole peg L = 27.28  R = 1.53.94   Tone   Assessment Location Right Upper Extremity  pt with mildly increased flexor tone bicep/wrist fingers   AROM   Overall AROM Comments pt has difficulty with shoulder flexion/abduction and full elbow extension  combined.  Able to do  full shoulder flexion.   Hand Function   Comments L= 95 lbs  R= 60 pounds              OT Short Term Goals - December 07, 2014 1424    OT SHORT TERM GOAL #1   Title Pt and wife will be I with HEP - 12/23/2014   Status New   OT SHORT TERM GOAL #2   Title Pt will demonstrate improved fine motor control as evidenced by being able to manipulate moderately small objects within a functional task.   Status New   OT SHORT TERM GOAL #3   Title Pt will demonstrate improved ROM to 75% for supination in prep for improved functional use of RUE (currently 40% range)   Status New          OT Long Term Goals - 12/07/2014 1428    OT LONG TERM GOAL #1   Title Pt  and wife will be I with upgraded HEP - 01/20/2015   Status New   OT LONG TERM GOAL #2   Title Pt will demonstrate improved coordination as evidenced by decreasing 9 hole peg test time by 8 seconds   Status New   OT LONG TERM GOAL #3   Title Pt will be able to hold and move a dinner plate with his RUE.   Status New          Plan - 07-Dec-2014 1416    Clinical Impression Statement Pt is a 68 year old male s/p L CVA in 2012 who presents today stating he wants his RUE to work "a little better - I wonder if there are other things I can do to make it better."  Pt presents with the following impairments which impede his ability to use his RUE functionally during basic ADL activity:  decreased coordination/ataxia, decreased  proxmal stability (vs true strength), decreased ROM.    Rehab Potential Good   Clinical Impairments Affecting Rehab Potential Pt can benefit from skilled OT to address the following:  decreased ROM, decreased coordination (ataxia), altered tone, impaired functional use of RUE, pt/family education.   OT Frequency 2x / week   OT Duration 8 weeks  pt may not need entire 8 weeks   OT Treatment/Interventions Neuromuscular education;Therapeutic exercise;Therapeutic activities;Patient/family education;Manual Therapy   Plan intiate HEP           G-Codes - 12-07-14 1432    Functional Assessment Tool Used 9 hole peg, block and box   Functional Limitation Carrying, moving and handling objects   Carrying, Moving and Handling Objects Current Status ([id number]) At least 60 percent but less than 80 percent impaired, limited  or restricted   Carrying, Moving and Handling Objects Goal Status [phone](G8985) At least 40 percent but less than 60 percent impaired, limited or restricted                             Problem List Patient Active Problem List   Diagnosis Date Noted  . Aftercare following surgery of the circulatory system 10/25/2014  . PVD (peripheral vascular disease) 10/25/2014  . Occlusion and stenosis of carotid artery without mention of cerebral infarction 11/16/2013  . Aftercare following surgery of the circulatory system, [place] 10/19/2013  . Peripheral vascular disease, unspecified 10/16/2012  . Venous insufficiency 02/19/2012  . CVA, old, hemiparesis 02/18/2012  . CAD (coronary artery disease) of artery bypass graft 02/18/2012  . Cellulitis 02/17/2012  . HTN (hypertension) 02/17/2012  . 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

## 2014-11-25 NOTE — Therapy (Signed)
[facility] [address] [place], [place], [address] Phone: [phone]   Fax:  [phone]  Physical Therapy Treatment  Patient Details  Name: [name] MRN: [id number] Date of Birth: May 22, 1946  Encounter Date: 11/25/2014      PT End of Session - 11/25/14 1748    Visit Number 13   Number of Visits 17   Date for PT Re-Evaluation 12/25/14   PT Start Time 1018   PT Stop Time 1102   PT Time Calculation (min) 44 min      Past Medical History  Diagnosis Date  . Coronary artery disease   . Diabetes mellitus   . Stroke feb 2012    complication of cath  . Myocardial infarction 2011  . Gout   . Peripheral vascular disease   . Arthritis     Gout    Past Surgical History  Procedure Laterality Date  . Coronary artery bypass graft    . Femoral bypass Left Sept. 30, 2011    Left Fem to above knee Pop Artery BPG  . Cardiac catheterization      stent done feb 2012  . Vascular surgery      There were no vitals taken for this visit.  Visit Diagnosis:  Abnormality of gait      Subjective Assessment - 11/25/14 1739    Symptoms pt. reports ambulating in home with use of rollator   Currently in Pain? No/denies            [facility] Adult PT Treatment/Exercise - 11/25/14 1032    Ambulation/Gait   Ambulation/Gait Yes   Ambulation/Gait Assistance 5: Supervision   Ambulation/Gait Assistance Details decr. dorsiflexion RLE   Ambulation Distance (Feet) 120 Feet   Assistive device 4-wheeled walker   Gait Pattern Decreased step length - right;Decreased dorsiflexion - right   Gait velocity 1.12   with Rollator   Stairs Yes   Stair Management Technique One rail Left   Number of Stairs 12   Height of Stairs 6   Lumbar Exercises: Stretches   Passive Hamstring Stretch 2 reps;30 seconds   Single Knee to Chest Stretch 30 seconds   Lower Trunk Rotation 30 seconds   Prone Mid Back Stretch 30 seconds  sitting back on heels  in partial quadriped position   Knee/Hip Exercises: Supine   Bridges 10 reps  LE's on red physioball   Bridges Limitations R hip extensor weakness   Straight Leg Raises 10 reps  RLE   Knee/Hip Exercises: Sidelying   Hip ABduction 10 reps   Clams 10 reps RLE   Other Sidelying Knee Exercises right hip abduction in sidelying x 10 reps:   Knee/Hip Exercises: Prone   Hamstring Curl 10 reps  mod to max assist needed   Hamstring Curl Limitations weakness in R hamstrings            PT Short Term Goals - 11/25/14 1752    PT SHORT TERM GOAL #1   Title Return demo HEP with written cues and wife's assistance   Baseline met 11-25-14   Status Achieved   PT SHORT TERM GOAL #2   Title Improve Berg balance test score to >/= 28/56 to reduce fall risk   Baseline met 10-28-14   Status Achieved   PT SHORT TERM GOAL #3   Title  Incr. gait speed by 0.5 ft/sec with LRAD   Baseline velocity 1.12 ft/sec   Status Not Met   PT SHORT TERM GOAL #4  Title Negotiate 4 steps x 3 reps iwth 1 handrail and S   Baseline met 11-25-14   Status Achieved          PT Long Term Goals - 11/25/14 1754    PT LONG TERM GOAL #1   Title Amb. 250' with RW with SBA   Baseline 12-26-14   Time 4   Period Weeks   Status New   PT LONG TERM GOAL #2   Title Incr. gait velocity to >/= 1.4 ft/sec with RW   Baseline 12-26-14   Time 4   Period Weeks   Status New   PT LONG TERM GOAL #3   Title Independent in updated HEP   Baseline 12-26-14   Time 4   Period Weeks   Status New          Plan - 11/25/14 1750    Clinical Impression Statement Pt. has met 3/4 LTG's; Goal #2 not met due to decr. endurance; Goal #5 ongoing   Pt will benefit from skilled therapeutic intervention in order to improve on the following deficits Abnormal gait;Decreased endurance;Decreased coordination;Decreased strength;Decreased balance   Rehab Potential Good   PT Frequency 2x / week   PT Duration 4 weeks   PT Treatment/Interventions Gait  training;Therapeutic exercise;Patient/family education;Balance training;Stair training;Functional mobility training;Neuromuscular re-education;Therapeutic activities   PT Home Exercise Plan ambulate as much as tolerated   Consulted and Agree with Plan of Care Patient;Family member/caregiver   PT Plan continue with exercises to strengthen and stretch RLE                               Problem List Patient Active Problem List   Diagnosis Date Noted  . Aftercare following surgery of the circulatory system 10/25/2014  . PVD (peripheral vascular disease) 10/25/2014  . Occlusion and stenosis of carotid artery without mention of cerebral infarction 11/16/2013  . Aftercare following surgery of the circulatory system, [place] 10/19/2013  . Peripheral vascular disease, unspecified 10/16/2012  . Venous insufficiency 02/19/2012  . CVA, old, hemiparesis 02/18/2012  . CAD (coronary artery disease) of artery bypass graft 02/18/2012  . Cellulitis 02/17/2012  . HTN (hypertension) 02/17/2012  . 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

## 2014-11-29 ENCOUNTER — Ambulatory Visit: Payer: Medicare Other | Admitting: Occupational Therapy

## 2014-11-29 ENCOUNTER — Encounter: Payer: Self-pay | Admitting: Occupational Therapy

## 2014-11-29 ENCOUNTER — Ambulatory Visit: Payer: Medicare Other | Admitting: Physical Therapy

## 2014-11-29 ENCOUNTER — Encounter: Payer: Self-pay | Admitting: Physical Therapy

## 2014-11-29 DIAGNOSIS — G8191 Hemiplegia, unspecified affecting right dominant side: Secondary | ICD-10-CM

## 2014-11-29 DIAGNOSIS — R269 Unspecified abnormalities of gait and mobility: Secondary | ICD-10-CM | POA: Diagnosis not present

## 2014-11-29 DIAGNOSIS — I69354 Hemiplegia and hemiparesis following cerebral infarction affecting left non-dominant side: Secondary | ICD-10-CM | POA: Diagnosis not present

## 2014-11-29 DIAGNOSIS — Z5189 Encounter for other specified aftercare: Secondary | ICD-10-CM | POA: Diagnosis not present

## 2014-11-29 DIAGNOSIS — Z951 Presence of aortocoronary bypass graft: Secondary | ICD-10-CM | POA: Diagnosis not present

## 2014-11-29 DIAGNOSIS — IMO0002 Reserved for concepts with insufficient information to code with codable children: Secondary | ICD-10-CM

## 2014-11-29 DIAGNOSIS — I252 Old myocardial infarction: Secondary | ICD-10-CM | POA: Diagnosis not present

## 2014-11-29 DIAGNOSIS — R270 Ataxia, unspecified: Secondary | ICD-10-CM

## 2014-11-29 DIAGNOSIS — M6281 Muscle weakness (generalized): Secondary | ICD-10-CM | POA: Diagnosis not present

## 2014-11-29 NOTE — Therapy (Signed)
[facility] [address] [place], [place], [address] Phone: [phone]   Fax:  [phone]  Physical Therapy Treatment  Patient Details  Name: [name] MRN: [id number] Date of Birth: 1946-09-03  Encounter Date: 11/29/2014      PT End of Session - 11/29/14 1942    Visit Number 14   Number of Visits 17   Date for PT Re-Evaluation 12/25/14   PT Start Time 0931   PT Stop Time 1020   PT Time Calculation (min) 49 min   Equipment Utilized During Treatment Gait belt      Past Medical History  Diagnosis Date  . Coronary artery disease   . Diabetes mellitus   . Stroke feb 2012    complication of cath  . Myocardial infarction 2011  . Gout   . Peripheral vascular disease   . Arthritis     Gout    Past Surgical History  Procedure Laterality Date  . Coronary artery bypass graft    . Femoral bypass Left Sept. 30, 2011    Left Fem to above knee Pop Artery BPG  . Cardiac catheterization      stent done feb 2012  . Vascular surgery      There were no vitals taken for this visit.  Visit Diagnosis:  Abnormality of gait - Plan: PT plan of care cert/re-cert  Hemiplegia affecting right dominant side - Plan: PT plan of care cert/re-cert      Subjective Assessment - 11/29/14 1917    Symptoms Pt reports he is wearing AFO on RLE today for first time in a while   Currently in Pain? No/denies            [facility] Adult [facility] Treatment/Exercise - 11/29/14 1240    Ambulation/Gait   Ambulation/Gait Yes   Ambulation/Gait Assistance 5: Supervision   Ambulation/Gait Assistance Details pt. wearing AFO on RLE   improved foot clearance noted with use of AFO   Ambulation Distance (Feet) 100 Feet   Assistive device 4-wheeled walker   Stairs Yes   Stair Management Technique Two rails;Forwards;Step to pattern   Number of Stairs 4   Height of Stairs 6   Dynamic Standing Balance   Dynamic Standing - Balance Activities Rocker board   with bil. UE support   Dynamic Standing - Comments sidestepping inside bars with UE suport   Exercises   Exercises Knee/Hip;Lumbar   Lumbar Exercises: Stretches   Single Knee to Chest Stretch 1 rep;30 seconds  RLE; also right piriformis stretch 30 sec hold   Lower Trunk Rotation 1 rep;30 seconds  to left side   Standing Extension 10 seconds;2 reps  standing at steps   Quadruped Mid Back Stretch 30 seconds;1 rep   Piriformis Stretch 30 seconds  passive   Lumbar Exercises: Standing   Heel Raises 10 reps  bil. LE's   Heel Raises Limitations weakness in right plantarflexors   Lumbar Exercises: Supine   Other Supine Lumbar Exercises bridging with hip abdct/adduction 5 reps: bridging with marching x 10 reps: bridging with RLE extension x 5 reps   Lumbar Exercises: Quadruped   Other Quadruped Lumbar Exercises attempted lifting RLE in hip extension but pt unable to maintain balance   Knee/Hip Exercises: Supine   Bridges 10 reps   Straight Leg Raises 10 reps   Knee/Hip Exercises: Sidelying   Hip ABduction Right;AROM;10 reps   Clams 10 reps RLE   Knee/Hip Exercises: Prone   Hamstring Curl 10 reps  assisted   Hip Extension 10 reps;AAROM  with knee flexed at 90              PT Long Term Goals - 11/29/14 1954    PT LONG TERM GOAL #1   Title Amb. 250' with RW with SBA   Baseline 12-26-14   Time 4   Period Weeks   Status New   PT LONG TERM GOAL #2   Title Incr. gait velocity to >/= 1.4 ft/sec with RW   Baseline 12-26-14   Time 4   Period Weeks   Status New   PT LONG TERM GOAL #3   Title Independent in updated HEP   Baseline 12-26-14   Time 4   Period Weeks   Status New          Plan - 11/29/14 1951    Clinical Impression Statement AFO on RLE improves gait by increasing foot clearance and decreasing supination in swing phase of gait   Pt will benefit from skilled therapeutic intervention in order to improve on the following deficits Abnormal gait;Decreased  endurance;Decreased coordination;Decreased strength;Decreased balance   Rehab Potential Good   PT Frequency 2x / week   PT Duration 4 weeks   PT Treatment/Interventions Gait training;Therapeutic exercise;Patient/family education;Balance training;Stair training;Functional mobility training;Neuromuscular re-education;Therapeutic activities   PT Next Visit Plan continue with RLE strengthening and stretching; standing balance as tolerated   Consulted and Agree with Plan of Care Patient;Family member/caregiver   PT Plan continue with exercises to strengthen and stretch RLE                               Problem List Patient Active Problem List   Diagnosis Date Noted  . Aftercare following surgery of the circulatory system 10/25/2014  . PVD (peripheral vascular disease) 10/25/2014  . Occlusion and stenosis of carotid artery without mention of cerebral infarction 11/16/2013  . Aftercare following surgery of the circulatory system, [facility] 10/19/2013  . Peripheral vascular disease, unspecified 10/16/2012  . Venous insufficiency 02/19/2012  . CVA, old, hemiparesis 02/18/2012  . CAD (coronary artery disease) of artery bypass graft 02/18/2012  . Cellulitis 02/17/2012  . HTN (hypertension) 02/17/2012  . 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

## 2014-11-29 NOTE — Patient Instructions (Signed)
Theraputty Home Exercise Program:  Use the RED putty.  Do the following exercises 3 TIMES EACH for 1-2 SESSIONS per day.  Gross Opposition 1.  Make a ball 2.  Make a pancake 3.  Make a cone Repeat 3 times  Gross Finger Flexion 4.  Make a hot dog then grip putty Repeat 3 times  Intrinsic Function 5.  Pull Taffy Repeat 3 times  Thumb Palmar Abduction 6.  Put a ring of putty around all your fingers including your thumb.  Open your hand wide. Repeat three times  Gross Finger and Wrist Extension 7.  Make a hot dog.  Pinch all along the hot dog with your index and thumb. Repeat three times  Stop if you get pain and tell your therapist.  It is really important that you do these every single day in order to meet your goals!!

## 2014-11-29 NOTE — Therapy (Signed)
[facility] [address] [place], [place], [address] Phone: [phone]   Fax:  [phone]  Occupational Therapy Treatment  Patient Details  Name: [name] MRN: [id number] Date of Birth: 1946-02-04  Encounter Date: 11/29/2014      OT End of Session - 11/29/14 1144    Visit Number 2   Number of Visits 16   Date for OT Re-Evaluation 01/20/15   OT Start Time 1101   OT Stop Time 1143   OT Time Calculation (min) 42 min   Activity Tolerance Patient tolerated treatment well      Past Medical History  Diagnosis Date  . Coronary artery disease   . Diabetes mellitus   . Stroke feb 2012    complication of cath  . Myocardial infarction 2011  . Gout   . Peripheral vascular disease   . Arthritis     Gout    Past Surgical History  Procedure Laterality Date  . Coronary artery bypass graft    . Femoral bypass Left Sept. 30, 2011    Left Fem to above knee Pop Artery BPG  . Cardiac catheterization      stent done feb 2012  . Vascular surgery      There were no vitals taken for this visit.  Visit Diagnosis:  Lack of coordination due to stroke  Ataxia  Hemiplegia affecting right dominant side      Subjective Assessment - 11/29/14 1105    Symptoms (p) "I am doing well today"   Pertinent History (p) see epic snapshot   Currently in Pain? (p) No/denies            OT Treatments/Exercises (OP) - 11/29/14 1143    Exercises   Exercises Hand   Hand Exercises   Theraputty Flatten;Roll;Grip;Pinch;Locate Pegs  Red Putty 3 reps of each.  15 pegs into board          OT Education - 11/29/14 1142    Education provided Yes   Education Details Therputty HEP with red putty   Person(s) Educated Patient;Spouse   Methods Explanation;Demonstration;Handout   Comprehension Verbalized understanding;Returned demonstration          OT Short Term Goals - 11/29/14 1146    OT SHORT TERM GOAL #1   Title Pt and wife  will be I with HEP - 12/23/2014   Status On-going   OT SHORT TERM GOAL #2   Title Pt will demonstrate improved fine motor control as evidenced by being able to manipulate moderately small objects within a functional task.   Status On-going   OT SHORT TERM GOAL #3   Title Pt will demonstrate improved ROM to 75% for supination in prep for improved functional use of RUE (currently 40% range)   Status On-going          OT Long Term Goals - 11/29/14 1146    OT LONG TERM GOAL #1   Title Pt  and wife will be I with upgraded HEP - 01/20/2015   Status On-going   OT LONG TERM GOAL #2   Title Pt will demonstrate improved coordination as evidenced by decreasing 9 hole peg test time by 8 seconds   Status On-going   OT LONG TERM GOAL #3   Title Pt will be able to hold and move a dinner plate with his RUE.   Status On-going          Plan - 11/29/14 1145    Clinical Impression Statement Reviewed short  term and long term goals with patient and wife. Stressed importance of HEP and provided beginning program.   Rehab Potential Good   Clinical Impairments Affecting Rehab Potential Pt can benefit from skilled OT to address the following:  decreased ROM, decreased coordination (ataxia), altered tone, impaired functional use of RUE, pt/family education.   OT Frequency 2x / week   OT Duration 8 weeks   OT Treatment/Interventions Neuromuscular education;Therapeutic exercise;Therapeutic activities;Patient/family education;Manual Therapy   Plan check HEP; strengthening and coordination                               Problem List Patient Active Problem List   Diagnosis Date Noted  . Aftercare following surgery of the circulatory system 10/25/2014  . PVD (peripheral vascular disease) 10/25/2014  . Occlusion and stenosis of carotid artery without mention of cerebral infarction 11/16/2013  . Aftercare following surgery of the circulatory system, [place] 10/19/2013  . Peripheral  vascular disease, unspecified 10/16/2012  . Venous insufficiency 02/19/2012  . CVA, old, hemiparesis 02/18/2012  . CAD (coronary artery disease) of artery bypass graft 02/18/2012  . Cellulitis 02/17/2012  . HTN (hypertension) 02/17/2012  . 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

## 2014-11-30 ENCOUNTER — Ambulatory Visit: Payer: Medicare Other | Admitting: Occupational Therapy

## 2014-11-30 ENCOUNTER — Ambulatory Visit: Payer: Medicare Other | Admitting: Physical Therapy

## 2014-11-30 ENCOUNTER — Encounter: Payer: Self-pay | Admitting: Occupational Therapy

## 2014-11-30 ENCOUNTER — Encounter: Payer: Self-pay | Admitting: Physical Therapy

## 2014-11-30 DIAGNOSIS — Z5189 Encounter for other specified aftercare: Secondary | ICD-10-CM | POA: Diagnosis not present

## 2014-11-30 DIAGNOSIS — R269 Unspecified abnormalities of gait and mobility: Secondary | ICD-10-CM | POA: Diagnosis not present

## 2014-11-30 DIAGNOSIS — M6281 Muscle weakness (generalized): Secondary | ICD-10-CM | POA: Diagnosis not present

## 2014-11-30 DIAGNOSIS — G8191 Hemiplegia, unspecified affecting right dominant side: Secondary | ICD-10-CM

## 2014-11-30 DIAGNOSIS — I252 Old myocardial infarction: Secondary | ICD-10-CM | POA: Diagnosis not present

## 2014-11-30 DIAGNOSIS — R270 Ataxia, unspecified: Secondary | ICD-10-CM

## 2014-11-30 DIAGNOSIS — Z951 Presence of aortocoronary bypass graft: Secondary | ICD-10-CM | POA: Diagnosis not present

## 2014-11-30 DIAGNOSIS — IMO0002 Reserved for concepts with insufficient information to code with codable children: Secondary | ICD-10-CM

## 2014-11-30 DIAGNOSIS — I69354 Hemiplegia and hemiparesis following cerebral infarction affecting left non-dominant side: Secondary | ICD-10-CM | POA: Diagnosis not present

## 2014-11-30 NOTE — Therapy (Signed)
[facility] [address] [place], [place], [address] Phone: [phone]   Fax:  [phone]  Occupational Therapy Treatment  Patient Details  Name: [name] MRN: [id number] Date of Birth: 01/19/1946  Encounter Date: 11/30/2014      OT End of Session - 11/30/14 1233    Visit Number 3   Number of Visits 16   Date for OT Re-Evaluation 01/20/15   OT Start Time 1016   OT Stop Time 1059   OT Time Calculation (min) 43 min   Activity Tolerance Patient tolerated treatment well      Past Medical History  Diagnosis Date  . Coronary artery disease   . Diabetes mellitus   . Stroke feb 2012    complication of cath  . Myocardial infarction 2011  . Gout   . Peripheral vascular disease   . Arthritis     Gout    Past Surgical History  Procedure Laterality Date  . Coronary artery bypass graft    . Femoral bypass Left Sept. 30, 2011    Left Fem to above knee Pop Artery BPG  . Cardiac catheterization      stent done feb 2012  . Vascular surgery      There were no vitals taken for this visit.  Visit Diagnosis:  Lack of coordination due to stroke  Ataxia  Hemiplegia affecting right dominant side      Subjective Assessment - 11/30/14 1017    Symptoms (p) "I am feeling pretty good"   Pertinent History (p) see epic snapshot   Currently in Pain? (p) No/denies            OT Treatments/Exercises (OP) - 11/30/14 0001    Exercises   Exercises Shoulder   Shoulder Exercises: Supine   Shoulder Flexion Weight (lbs) supine with 3 lb weight: chest presses 12 reps x2, shoulder flexion and extension full range 12 reps x2 with 3 pound weight.  Pt required min vc's   Modalities   Modalities Moist Heat  to R forearm   Moist Heat Therapy   Number Minutes Moist Heat 10 Minutes   Moist Heat Location Other (comment)  R forearm to address supination ROM   Manual Therapy   Manual Therapy Joint mobilization;Myofascial  release;Manual Traction  increase forearm ROM/pt instructed in stretching program          OT Education - 11/29/14 1142    Education provided Yes   Education Details Therputty HEP with red putty   Person(s) Educated Patient;Spouse   Methods Explanation;Demonstration;Handout   Comprehension Verbalized understanding;Returned demonstration          OT Short Term Goals - 11/30/14 1236    OT SHORT TERM GOAL #1   Title Pt and wife will be I with HEP - 12/23/2014   Status On-going   OT SHORT TERM GOAL #2   Title Pt will demonstrate improved fine motor control as evidenced by being able to manipulate moderately small objects within a functional task.   Status On-going   OT SHORT TERM GOAL #3   Title Pt will demonstrate improved ROM to 75% for supination in prep for improved functional use of RUE (currently 40% range)   Status On-going          OT Long Term Goals - 11/30/14 1236    OT LONG TERM GOAL #1   Title Pt  and wife will be I with upgraded HEP - 01/20/2015   Status On-going   OT  LONG TERM GOAL #2   Title Pt will demonstrate improved coordination as evidenced by decreasing 9 hole peg test time by 8 seconds   Status On-going   OT LONG TERM GOAL #3   Title Pt will be able to hold and move a dinner plate with his RUE.   Status On-going          Plan - 11/30/14 1234    Clinical Impression Statement Pt instructed in additional HEP exercises to address stretching of R forearm, proximal strength/stability to assist with ataxia. Pt able to return demonstrate and wife present.   Rehab Potential Good   Clinical Impairments Affecting Rehab Potential Pt can benefit from skilled OT to address the following:  decreased ROM, decreased coordination (ataxia), altered tone, impaired functional use of RUE, pt/family education.   OT Frequency 2x / week   OT Duration 8 weeks   OT Treatment/Interventions Neuromuscular education;Therapeutic exercise;Therapeutic activities;Patient/family  education;Manual Therapy   Plan check HEP, strengthening and coordination   Consulted and Agree with Plan of Care Patient;Family member/caregiver   Family Member Consulted wife                               Problem List Patient Active Problem List   Diagnosis Date Noted  . Aftercare following surgery of the circulatory system 10/25/2014  . PVD (peripheral vascular disease) 10/25/2014  . Occlusion and stenosis of carotid artery without mention of cerebral infarction 11/16/2013  . Aftercare following surgery of the circulatory system, NEC 10/19/2013  . Peripheral vascular disease, unspecified 10/16/2012  . Venous insufficiency 02/19/2012  . CVA, old, hemiparesis 02/18/2012  . CAD (coronary artery disease) of artery bypass graft 02/18/2012  . Cellulitis 02/17/2012  . HTN (hypertension) 02/17/2012  . 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

## 2014-11-30 NOTE — Therapy (Signed)
[facility] [address] [place], [place], [address] Phone: [phone]   Fax:  [phone]  Physical Therapy Treatment  Patient Details  Name: [name] MRN: [id number] Date of Birth: [birth year]  Encounter Date: 11/30/2014      PT End of Session - 11/30/14 2040    Visit Number 15   Number of Visits 25   Date for PT Re-Evaluation 12/25/14   PT Start Time 0934   PT Stop Time 1020   PT Time Calculation (min) 46 min      Past Medical History  Diagnosis Date  . Coronary artery disease   . Diabetes mellitus   . Stroke feb 2012    complication of cath  . Myocardial infarction 2011  . Gout   . Peripheral vascular disease   . Arthritis     Gout    Past Surgical History  Procedure Laterality Date  . Coronary artery bypass graft    . Femoral bypass Left Sept. 30, 2011    Left Fem to above knee Pop Artery BPG  . Cardiac catheterization      stent done feb 2012  . Vascular surgery      There were no vitals taken for this visit.  Visit Diagnosis:  Hemiplegia affecting right dominant side  Abnormality of gait      Subjective Assessment - 11/30/14 2032    Symptoms Pt. reports "I just want to be able to walk without this walker"; wearing AFO again today   Currently in Pain? No/denies            [facility]/Exercise - 11/30/14 0945    Ambulation/Gait   Ambulation/Gait Yes   Ambulation/Gait Assistance 5: Supervision   Ambulation/Gait Assistance Details continued decr. eccentric dorsiflexion LLE with incr. trunk flexion   Ambulation Distance (Feet) 100 Feet   Assistive device 4-wheeled walker   Static Standing Balance   Static Standing - Balance Support Bilateral upper extremity supported   Tandem Stance - Right Leg --  left foot on 4" step for incr. weight-bearing on LLE   Dynamic Standing Balance   Dynamic Standing - Comments rockerboard with bil. UE support   Lumbar Exercises: Supine   Clam 10 reps   Bridge 10 reps  r 1/2 bridge 10 reps   Straight Leg Raise 10 reps   Lumbar Exercises: Sidelying   Clam 10 reps   Knee/Hip Exercises: Stretches   Passive Hamstring Stretch 30 seconds;1 rep  with contract/relax   Knee/Hip Exercises: Standing   Heel Raises 10 reps   Knee/Hip Exercises: Prone   Hamstring Curl 10 reps  AAROM   Ambulation   Ambulation/Gait Assistance Details Verbal cues for gait pattern  verbal cues for erect posture       Pt. Performed stepping over/back of balance beam with UE support with CGA. Ambulated forward and backward inside bars with minimal UE support with CGA to SBA 10' 2 each direction.        PT Long Term Goals - 11/29/14 1954    PT LONG TERM GOAL #1   Title Amb. 250' with RW with SBA   Baseline 12-26-14   Time 4   Period Weeks   Status New   PT LONG TERM GOAL #2   Title Incr. gait velocity to >/= 1.4 ft/sec with RW   Baseline 12-26-14   Time 4   Period Weeks   Status New   PT LONG TERM GOAL #3   Title  Independent in updated HEP   Baseline 12-26-14   Time 4   Period Weeks   Status New          Plan - 11/30/14 2042    Clinical Impression Statement pt. continues to be limited in standing activities and standing tolerance due to c/o back pain that occurs with standing without UE support   Pt will benefit from skilled therapeutic intervention in order to improve on the following deficits Abnormal gait;Decreased endurance;Decreased coordination;Decreased strength;Decreased balance   Rehab Potential Good   PT Frequency 2x / week   PT Duration 4 weeks   PT Treatment/Interventions Gait training;Therapeutic exercise;Patient/family education;Balance training;Stair training;Functional mobility training;Neuromuscular re-education;Therapeutic activities   PT Next Visit Plan continue with RLE strengthening and stretching; standing balance as tolerated                               Problem List Patient Active  Problem List   Diagnosis Date Noted  . Aftercare following surgery of the circulatory system 10/25/2014  . PVD (peripheral vascular disease) 10/25/2014  . Occlusion and stenosis of carotid artery without mention of cerebral infarction 11/16/2013  . Aftercare following surgery of the circulatory system, NEC 10/19/2013  . Peripheral vascular disease, unspecified 10/16/2012  . Venous insufficiency 02/19/2012  . CVA, old, hemiparesis 02/18/2012  . CAD (coronary artery disease) of artery bypass graft 02/18/2012  . Cellulitis 02/17/2012  . HTN (hypertension) 02/17/2012  . 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

## 2014-11-30 NOTE — Patient Instructions (Signed)
Add these exercises to your home program!  MAKE SURE YOU BREATHE!!  It is ok to take a break if you need to.  Do these 1-2 times per day in addition to your theraputty program.  1.  Lay on your back.  Hold a three pound dumb bell with both hands with your hands facing each other.  Put some pressure between your hands.  SLOWLY raise the dumb bell to the ceiling then return to your chest.  CONTROL THE WEIGHT AS YOU GO!  Do 12, rest, do 12 more.  2. Lay on your back.  Hold a three pound dumb bell with both hands with your hands facing each other.  Put some pressure between your hands. SLOWLY raise to the ceiling then, keeping your elbows straight, reach for your knees then reach overhead.  Do 12 times, rest, do 12 more.  3.  Sit at table (you can do this in your lap if you need to).  Turn your right hand up to the ceiling.  Use your left hand to turn it over as far as you can.  HOLD FOR A SLOW COUNT OF 5!!  Do 15 times.  Make sure you let your right hand do most of the work. The left hand is just stretching at the end to get as much range as possible.  DO THISE @ TIMES PER DAY.  Respect any pain you may have.  Work within your pain limits and tell your therapist if you get pain.  Sit up slowly - you may feel a little light headed when you finish.

## 2014-12-06 ENCOUNTER — Encounter: Payer: Self-pay | Admitting: Occupational Therapy

## 2014-12-06 ENCOUNTER — Ambulatory Visit: Payer: Medicare Other | Admitting: Occupational Therapy

## 2014-12-06 ENCOUNTER — Encounter: Payer: Self-pay | Admitting: Physical Therapy

## 2014-12-06 ENCOUNTER — Ambulatory Visit: Payer: Medicare Other | Admitting: Physical Therapy

## 2014-12-06 DIAGNOSIS — R269 Unspecified abnormalities of gait and mobility: Secondary | ICD-10-CM

## 2014-12-06 DIAGNOSIS — I252 Old myocardial infarction: Secondary | ICD-10-CM | POA: Diagnosis not present

## 2014-12-06 DIAGNOSIS — Z5189 Encounter for other specified aftercare: Secondary | ICD-10-CM | POA: Diagnosis not present

## 2014-12-06 DIAGNOSIS — I69354 Hemiplegia and hemiparesis following cerebral infarction affecting left non-dominant side: Secondary | ICD-10-CM | POA: Diagnosis not present

## 2014-12-06 DIAGNOSIS — G8191 Hemiplegia, unspecified affecting right dominant side: Secondary | ICD-10-CM

## 2014-12-06 DIAGNOSIS — Z951 Presence of aortocoronary bypass graft: Secondary | ICD-10-CM | POA: Diagnosis not present

## 2014-12-06 DIAGNOSIS — IMO0002 Reserved for concepts with insufficient information to code with codable children: Secondary | ICD-10-CM

## 2014-12-06 DIAGNOSIS — M6281 Muscle weakness (generalized): Secondary | ICD-10-CM | POA: Diagnosis not present

## 2014-12-06 DIAGNOSIS — R270 Ataxia, unspecified: Secondary | ICD-10-CM

## 2014-12-06 NOTE — Therapy (Signed)
[facility] [address] [place], [place], [address] Phone: [phone]   Fax:  [phone]  Physical Therapy Treatment  Patient Details  Name: Craig DeedClarence M Centola MRN: [id number] Date of Birth: Sep 11, 1946  Encounter Date: 12/06/2014      PT End of Session - 12/06/14 1659    Visit Number 16   Number of Visits 25   Date for PT Re-Evaluation 12/25/14   PT Start Time 0932   PT Stop Time 1020   PT Time Calculation (min) 48 min   Activity Tolerance Patient tolerated treatment well      Past Medical History  Diagnosis Date  . Coronary artery disease   . Diabetes mellitus   . Stroke feb 2012    complication of cath  . Myocardial infarction 2011  . Gout   . Peripheral vascular disease   . Arthritis     Gout    Past Surgical History  Procedure Laterality Date  . Coronary artery bypass graft    . Femoral bypass Left Sept. 30, 2011    Left Fem to above knee Pop Artery BPG  . Cardiac catheterization      stent done feb 2012  . Vascular surgery      There were no vitals taken for this visit.  Visit Diagnosis:  Abnormality of gait  Hemiplegia affecting right dominant side      Subjective Assessment - 12/06/14 1648    Symptoms pt. states he was sick over the weekend but is feeling better today; reports doing exercises at home; states walking is better with his AFO   Currently in Pain? No/denies            [facility]/Exercise - 12/06/14 1651    Ambulation/Gait   Ambulation/Gait Yes   Ambulation/Gait Assistance 5: Supervision   Ambulation Distance (Feet) 100 Feet   Assistive device Rolling walker  pt. ambulated inside bars 10'x4 without UE support   High Level Balance   High Level Balance Activities Side stepping  2 reps inside bars with minimal UE support   Exercises   Exercises Lumbar;Knee/Hip   Lumbar Exercises: Stretches   Single Knee to Chest Stretch 3 reps;10 seconds   Lower Trunk  Rotation 1 rep;30 seconds  to left side for R side stretching   Quadruped Mid Back Stretch 1 rep;20 seconds  walked hands over to left side   Quad Stretch 1 rep;10 seconds  sitting back on heels for R quad stretch   Lumbar Exercises: Aerobic   Stationary Bike scifit level 6 x 10"  no charge as unsupervised   Lumbar Exercises: Supine   Clam 10 reps   Bridge 5 reps   Straight Leg Raise 10 reps  RLE   Other Supine Lumbar Exercises bridging with hip abdct/adduction x 5 reps; bridging iwth RLE extension x 5 reps; bridging with marching x 5 reps   Knee/Hip Exercises: Prone   Hamstring Curl 10 reps  RLE ; AAROM   Other Prone Exercises R hip extension with flexed knee 10 reps with AAROM     In left sidelying - pt. Performed right hip flexion/extension with moderate resistance 10 reps; In supine - right hip extension control exercise off side of mat x 10 reps with no weight         PT Long Term Goals - 12/06/14 1703    PT LONG TERM GOAL #1   Title Amb. 250' with RW with SBA   Baseline 12-26-14  Time 3   Period Weeks   Status On-going   PT LONG TERM GOAL #2   Title Incr. gait velocity to >/= 1.4 ft/sec with RW   Time 3   Period Weeks   Status On-going   PT LONG TERM GOAL #3   Title Independent in updated HEP   Baseline 12-26-14   Time 3   Period Weeks   Status On-going          Plan - 12/06/14 1659    Clinical Impression Statement pt. continues to have incr. tone in RLE and has decr. eccentric control even though he is wearing PLS on RLE; back pain limits unsupported standing and ambulation   Pt will benefit from skilled therapeutic intervention in order to improve on the following deficits Abnormal gait;Decreased endurance;Decreased coordination;Decreased strength;Decreased balance   Rehab Potential Good   PT Frequency 2x / week   PT Duration 3 weeks   PT Treatment/Interventions Gait training;Therapeutic exercise;Patient/family education;Balance training;Stair  training;Functional mobility training;Neuromuscular re-education;Therapeutic activities   PT Next Visit Plan continue with RLE strengthening and stretching; standing balance as tolerated   PT Home Exercise Plan ambulate as much as tolerated   Consulted and Agree with Plan of Care Patient;Family member/caregiver   PT Plan continue with exercises to strengthen and stretch RLE; standing balance as tolerated (limited by back pain)                               Problem List Patient Active Problem List   Diagnosis Date Noted  . Aftercare following surgery of the circulatory system 10/25/2014  . PVD (peripheral vascular disease) 10/25/2014  . Occlusion and stenosis of carotid artery without mention of cerebral infarction 11/16/2013  . Aftercare following surgery of the circulatory system, NEC 10/19/2013  . Peripheral vascular disease, unspecified 10/16/2012  . Venous insufficiency 02/19/2012  . CVA, old, hemiparesis 02/18/2012  . CAD (coronary artery disease) of artery bypass graft 02/18/2012  . Cellulitis 02/17/2012  . HTN (hypertension) 02/17/2012  . 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

## 2014-12-06 NOTE — Therapy (Signed)
[facility] [address] [place], [place], [address] Phone: [phone]   Fax:  [phone]  Occupational Therapy Treatment  Patient Details  Name: [name] MRN: [id number] Date of Birth: 1946-08-17  Encounter Date: 12/06/2014      OT End of Session - 12/06/14 1145    Visit Number 4   Number of Visits 16   Date for OT Re-Evaluation 01/20/15   OT Start Time 1101   OT Stop Time 1142   OT Time Calculation (min) 41 min   Activity Tolerance Patient tolerated treatment well      Past Medical History  Diagnosis Date  . Coronary artery disease   . Diabetes mellitus   . Stroke feb 2012    complication of cath  . Myocardial infarction 2011  . Gout   . Peripheral vascular disease   . Arthritis     Gout    Past Surgical History  Procedure Laterality Date  . Coronary artery bypass graft    . Femoral bypass Left Sept. 30, 2011    Left Fem to above knee Pop Artery BPG  . Cardiac catheterization      stent done feb 2012  . Vascular surgery      There were no vitals taken for this visit.  Visit Diagnosis:  Lack of coordination due to stroke  Ataxia  Hemiplegia affecting right dominant side          OT Treatments/Exercises (OP) - 12/06/14 0001    Neurological Re-education Exercises   Other Exercises 1 Neuro re ed to address alternating supinationa and pronation while holding/moving objects with min facilitation; upward reach  bilaterally to address proximal control then advancing to single reach with RUE with mod facilitation over 90 degress with fine motor task in space.            OT Short Term Goals - 12/06/14 1146    OT SHORT TERM GOAL #1   Title Pt and wife will be I with HEP - 12/23/2014   Status On-going   OT SHORT TERM GOAL #2   Title Pt will demonstrate improved fine motor control as evidenced by being able to manipulate moderately small objects within a functional task.   Status On-going    OT SHORT TERM GOAL #3   Title Pt will demonstrate improved ROM to 75% for supination in prep for improved functional use of RUE (currently 40% range)   Status On-going          OT Long Term Goals - 12/06/14 1146    OT LONG TERM GOAL #1   Title Pt  and wife will be I with upgraded HEP - 01/20/2015   Status On-going   OT LONG TERM GOAL #2   Title Pt will demonstrate improved coordination as evidenced by decreasing 9 hole peg test time by 8 seconds   Status On-going   OT LONG TERM GOAL #3   Title Pt will be able to hold and move a dinner plate with his RUE.   Status On-going          Plan - 12/06/14 1145    Clinical Impression Statement Pt completing HEP consistently per pt report.  Pt making good progress toward STG's   Rehab Potential Good   Clinical Impairments Affecting Rehab Potential Pt can benefit from skilled OT to address the following:  decreased ROM, decreased coordination (ataxia), altered tone, impaired functional use of RUE, pt/family education.   OT Frequency 2x /  week   OT Duration 8 weeks   OT Treatment/Interventions Neuromuscular education;Therapeutic exercise;Therapeutic activities;Patient/family education;Manual Therapy                               Problem List Patient Active Problem List   Diagnosis Date Noted  . Aftercare following surgery of the circulatory system 10/25/2014  . PVD (peripheral vascular disease) 10/25/2014  . Occlusion and stenosis of carotid artery without mention of cerebral infarction 11/16/2013  . Aftercare following surgery of the circulatory system, NEC 10/19/2013  . Peripheral vascular disease, unspecified 10/16/2012  . Venous insufficiency 02/19/2012  . CVA, old, hemiparesis 02/18/2012  . CAD (coronary artery disease) of artery bypass graft 02/18/2012  . Cellulitis 02/17/2012  . HTN (hypertension) 02/17/2012  . 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

## 2014-12-07 ENCOUNTER — Ambulatory Visit (HOSPITAL_BASED_OUTPATIENT_CLINIC_OR_DEPARTMENT_OTHER): Payer: Medicare Other | Admitting: Physical Medicine & Rehabilitation

## 2014-12-07 ENCOUNTER — Encounter: Payer: Self-pay | Admitting: Physical Medicine & Rehabilitation

## 2014-12-07 ENCOUNTER — Encounter: Payer: Medicare Other | Attending: Physical Medicine & Rehabilitation

## 2014-12-07 VITALS — BP 120/67 | HR 63 | Resp 14 | Wt 264.0 lb

## 2014-12-07 DIAGNOSIS — R269 Unspecified abnormalities of gait and mobility: Secondary | ICD-10-CM | POA: Insufficient documentation

## 2014-12-07 DIAGNOSIS — G8111 Spastic hemiplegia affecting right dominant side: Secondary | ICD-10-CM

## 2014-12-07 DIAGNOSIS — Z951 Presence of aortocoronary bypass graft: Secondary | ICD-10-CM | POA: Insufficient documentation

## 2014-12-07 DIAGNOSIS — I6529 Occlusion and stenosis of unspecified carotid artery: Secondary | ICD-10-CM | POA: Diagnosis not present

## 2014-12-07 DIAGNOSIS — G8191 Hemiplegia, unspecified affecting right dominant side: Secondary | ICD-10-CM | POA: Insufficient documentation

## 2014-12-07 DIAGNOSIS — E119 Type 2 diabetes mellitus without complications: Secondary | ICD-10-CM | POA: Insufficient documentation

## 2014-12-07 DIAGNOSIS — I2510 Atherosclerotic heart disease of native coronary artery without angina pectoris: Secondary | ICD-10-CM | POA: Insufficient documentation

## 2014-12-07 DIAGNOSIS — G8110 Spastic hemiplegia affecting unspecified side: Secondary | ICD-10-CM

## 2014-12-07 NOTE — Patient Instructions (Signed)
Botox may be helpful to control Right arm, call if you want to schedule

## 2014-12-07 NOTE — Progress Notes (Signed)
Subjective:    Patient ID: [name], male    DOB: August 18, 1946, 68 y.o.   MRN: [id number]  HPI The patient is a 68 year old male, who presents with a spastic hemiplegia on the right after a CVA in February of 2012.    Started PT/OT at [facility] last month AFO in poor repair, planning to follow up with Orthotist while [facility]  Pain Inventory Average Pain 0 Pain Right Now 0 My pain is n/a  In the last 24 hours, has pain interfered with the following? General activity 0 Relation with others 0 Enjoyment of life 0 What TIME of day is your pain at its worst? n/a Sleep (in general) n/a  Pain is worse with: NA Pain improves with: n/a Relief from Meds: n/a  Mobility walk with assistance ability to climb steps?  yes do you drive?  no  Function retired  Neuro/Psych trouble walking  Prior Studies Any changes since last visit?  no  Physicians involved in your care Any changes since last visit?  no   Family History  Problem Relation Age of Onset  . Heart disease Mother   . Heart disease Father   . Diabetes Father   . Heart attack Father    History   Social History  . Marital Status: Married    Spouse Name: N/A    Number of Children: N/A  . Years of Education: N/A   Social History Main Topics  . Smoking status: Former Smoker -- 40 years  . Smokeless tobacco: Never Used  . Alcohol Use: No  . Drug Use: No  . Sexual Activity: None   Other Topics Concern  . None   Social History Narrative   Past Surgical History  Procedure Laterality Date  . Coronary artery bypass graft    . Femoral bypass Left Sept. 30, 2011    Left Fem to above knee Pop Artery BPG  . Cardiac catheterization      stent done feb 2012  . Vascular surgery     Past Medical History  Diagnosis Date  . Coronary artery disease   . Diabetes mellitus   . Stroke feb 2012    complication of cath  . Myocardial infarction 2011  . Gout   . Peripheral vascular  disease   . Arthritis     Gout   BP 120/67 mmHg  Pulse 63  Resp 14  Wt 264 lb (119.75 kg)  SpO2 93%  Opioid Risk Score:   Fall Risk Score: Moderate Fall Risk (6-13 points) (previously educated and given handout)  Review of Systems  Musculoskeletal: Positive for gait problem.  All other systems reviewed and are negative.      Objective:   Physical Exam  Nursing note and vitals reviewed.  Constitutional: He is oriented to person, place, and time. He appears well-developed and well-nourished.  HENT:  Head: Normocephalic and atraumatic.  Eyes: Conjunctivae and EOM are normal. Pupils are equal, round, and reactive to light.  Neurological: He is alert and oriented to person, place, and time.  Psychiatric: He has a normal mood and affect.   4/5 right deltoid, biceps , triceps, grip  4 minus in the hip flexor, knee extensor  2 minus to flexors and extensors right foot  5/5 in all groups on the left side  Sensory equal to light touch in both upper and lower limb  Clonus at the finger flexors. 3+ biceps triceps reflex.  Assessment & Plan:  1. Left pontine infarct Right hemiparesis, as discussed with patient unlikely to get any further spontaneous neurologic improvement. He may be able to improve coordination and thereby increased knee control which would in turn help with gait pattern. Further orthotic management may be helpful as well in conjunction with orthotist.  Would continue Zanaflex for spasticity. Still has quite a bit of clonus at the ankle and the finger flexors  Also with increased R elbow flexor tone which may respond to Botox- pt does not wish to pursue Return to clinic 6 months Will finish out therapy in early January

## 2014-12-09 ENCOUNTER — Encounter: Payer: Self-pay | Admitting: Physical Therapy

## 2014-12-09 ENCOUNTER — Ambulatory Visit: Payer: Medicare Other | Admitting: Physical Therapy

## 2014-12-09 DIAGNOSIS — R270 Ataxia, unspecified: Secondary | ICD-10-CM

## 2014-12-09 DIAGNOSIS — I252 Old myocardial infarction: Secondary | ICD-10-CM | POA: Diagnosis not present

## 2014-12-09 DIAGNOSIS — Z5189 Encounter for other specified aftercare: Secondary | ICD-10-CM | POA: Diagnosis not present

## 2014-12-09 DIAGNOSIS — IMO0002 Reserved for concepts with insufficient information to code with codable children: Secondary | ICD-10-CM

## 2014-12-09 DIAGNOSIS — R269 Unspecified abnormalities of gait and mobility: Secondary | ICD-10-CM | POA: Diagnosis not present

## 2014-12-09 DIAGNOSIS — Z630 Problems in relationship with spouse or partner: Secondary | ICD-10-CM | POA: Diagnosis not present

## 2014-12-09 DIAGNOSIS — G8191 Hemiplegia, unspecified affecting right dominant side: Secondary | ICD-10-CM

## 2014-12-09 DIAGNOSIS — M6281 Muscle weakness (generalized): Secondary | ICD-10-CM | POA: Diagnosis not present

## 2014-12-09 DIAGNOSIS — Z951 Presence of aortocoronary bypass graft: Secondary | ICD-10-CM | POA: Diagnosis not present

## 2014-12-09 DIAGNOSIS — G8110 Spastic hemiplegia affecting unspecified side: Secondary | ICD-10-CM

## 2014-12-09 DIAGNOSIS — I69354 Hemiplegia and hemiparesis following cerebral infarction affecting left non-dominant side: Secondary | ICD-10-CM | POA: Diagnosis not present

## 2014-12-09 DIAGNOSIS — F4320 Adjustment disorder, unspecified: Secondary | ICD-10-CM | POA: Diagnosis not present

## 2014-12-09 NOTE — Therapy (Signed)
[facility] [address] [place], [place], [address] Phone: [phone]   Fax:  [phone]  Physical Therapy Treatment  Patient Details  Name: Craig DeedClarence M Gasca MRN: [id number] Date of Birth: January 18, 1946  Encounter Date: 12/09/2014      PT End of Session - 12/09/14 1343    Visit Number 17   Number of Visits 25   Date for PT Re-Evaluation 12/25/14   PT Start Time 1015   PT Stop Time 1100   PT Time Calculation (min) 45 min   Equipment Utilized During Treatment Gait belt   Activity Tolerance Patient tolerated treatment well   Behavior During Therapy [facility] for tasks assessed/performed      Past Medical History  Diagnosis Date  . Coronary artery disease   . Diabetes mellitus   . Stroke feb 2012    complication of cath  . Myocardial infarction 2011  . Gout   . Peripheral vascular disease   . Arthritis     Gout    Past Surgical History  Procedure Laterality Date  . Coronary artery bypass graft    . Femoral bypass Left Sept. 30, 2011    Left Fem to above knee Pop Artery BPG  . Cardiac catheterization      stent done feb 2012  . Vascular surgery      There were no vitals taken for this visit.  Visit Diagnosis:  Lack of coordination due to stroke  Abnormality of gait  Hemiplegia affecting right dominant side  Ataxia  Spastic hemiplegia affecting nondominant side      Subjective Assessment - 12/09/14 1022    Symptoms No new complaints. No falls or pain to report. Still walking with right brace, feels he does better with it.   Currently in Pain? No/denies            [facility] Adult PT Treatment/Exercise - 12/09/14 1024    Ambulation/Gait   Ambulation/Gait Yes   Ambulation/Gait Assistance 5: Supervision   Ambulation/Gait Assistance Details cues on posture, increased right hip/knee flexion, and for right foot step placement with gait.                                 Ambulation Distance (Feet) 115 Feet  x 1,  120 feet x 1   Assistive device Rollator   Gait Pattern Step-through pattern;Decreased step length - right;Decreased stance time - right;Decreased stride length;Decreased hip/knee flexion - right;Right steppage;Decreased trunk rotation;Poor foot clearance - right   Gait velocity decreased   High Level Balance   High Level Balance Activities Side stepping;Tandem walking;Marching forwards;Marching backwards   High Level Balance Comments in parallel bars: intermittent with side stepping and constant UE assist needed for balance with cues on posture and ex form.   Knee/Hip Exercises: Standing   Hip ADduction AROM;Strengthening;Both;1 set;10 reps  alternating legs   Hip ADduction Limitations with UE assist and cues on ex form/technique   Functional Squat 1 set;10 reps  minisquats   Functional Squat Limitations with UE assist and cues on posture/ex form   Other Standing Knee Exercises alternating hip extension with knee extension x10 each leg with UE assistance and cues on form/technique                                   PT Long Term Goals - 12/09/14 1345  PT LONG TERM GOAL #1   Title Amb. 250' with RW with SBA   Baseline 12-26-14   Time 3   Period Weeks   Status On-going   PT LONG TERM GOAL #2   Title Incr. gait velocity to >/= 1.4 ft/sec with RW   Time 3   Period Weeks   Status On-going   PT LONG TERM GOAL #3   Title Independent in updated HEP   Baseline 12-26-14   Time 3   Period Weeks   Status On-going          Plan - 12/09/14 1344    Clinical Impression Statement Pt with improved activity tolerance today and able to participate in more standing activities and increase overall gait distance compared to last visit. Progressing toward goals.   Pt will benefit from skilled therapeutic intervention in order to improve on the following deficits Abnormal gait;Decreased endurance;Decreased coordination;Decreased strength;Decreased balance   Rehab Potential Good   PT Frequency  2x / week   PT Duration 3 weeks   PT Treatment/Interventions Gait training;Therapeutic exercise;Patient/family education;Balance training;Stair training;Functional mobility training;Neuromuscular re-education;Therapeutic activities   PT Next Visit Plan continue with RLE strengthening and stretching; standing balance as tolerated   PT Home Exercise Plan ambulate as much as tolerated   Consulted and Agree with Plan of Care Patient;Family member/caregiver   Family Member Consulted spouse   PT Plan continue with exercises to strengthen and stretch RLE; standing balance as tolerated (limited by back pain)        Problem List Patient Active Problem List   Diagnosis Date Noted  . Aftercare following surgery of the circulatory system 10/25/2014  . PVD (peripheral vascular disease) 10/25/2014  . Occlusion and stenosis of carotid artery without mention of cerebral infarction 11/16/2013  . Aftercare following surgery of the circulatory system, NEC 10/19/2013  . Peripheral vascular disease, unspecified 10/16/2012  . Venous insufficiency 02/19/2012  . CVA, old, hemiparesis 02/18/2012  . CAD (coronary artery disease) of artery bypass graft 02/18/2012  . Cellulitis 02/17/2012  . HTN (hypertension) 02/17/2012  . 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, [place] [address] [phone] 12/09/2014, 1:47 PM

## 2014-12-13 ENCOUNTER — Encounter: Payer: Self-pay | Admitting: Occupational Therapy

## 2014-12-13 ENCOUNTER — Encounter: Payer: Self-pay | Admitting: Physical Therapy

## 2014-12-13 ENCOUNTER — Ambulatory Visit: Payer: Medicare Other | Admitting: Occupational Therapy

## 2014-12-13 ENCOUNTER — Ambulatory Visit: Payer: Medicare Other | Admitting: Physical Therapy

## 2014-12-13 DIAGNOSIS — R270 Ataxia, unspecified: Secondary | ICD-10-CM

## 2014-12-13 DIAGNOSIS — G8191 Hemiplegia, unspecified affecting right dominant side: Secondary | ICD-10-CM

## 2014-12-13 DIAGNOSIS — Z951 Presence of aortocoronary bypass graft: Secondary | ICD-10-CM | POA: Diagnosis not present

## 2014-12-13 DIAGNOSIS — I252 Old myocardial infarction: Secondary | ICD-10-CM | POA: Diagnosis not present

## 2014-12-13 DIAGNOSIS — M6281 Muscle weakness (generalized): Secondary | ICD-10-CM | POA: Diagnosis not present

## 2014-12-13 DIAGNOSIS — R269 Unspecified abnormalities of gait and mobility: Secondary | ICD-10-CM | POA: Diagnosis not present

## 2014-12-13 DIAGNOSIS — I69354 Hemiplegia and hemiparesis following cerebral infarction affecting left non-dominant side: Secondary | ICD-10-CM | POA: Diagnosis not present

## 2014-12-13 DIAGNOSIS — IMO0002 Reserved for concepts with insufficient information to code with codable children: Secondary | ICD-10-CM

## 2014-12-13 DIAGNOSIS — Z5189 Encounter for other specified aftercare: Secondary | ICD-10-CM | POA: Diagnosis not present

## 2014-12-13 NOTE — Therapy (Signed)
[facility] [address] [place], [place], [address] Phone: [phone]   Fax:  [phone]  Occupational Therapy Treatment  Patient Details  Name: Craig DeedClarence M [name] MRN: [id number] Date of Birth: Jan 08, 1946  Encounter Date: 12/13/2014      OT End of Session - 12/13/14 1147    Visit Number 5   Number of Visits 16   Date for OT Re-Evaluation 01/20/15   OT Start Time 1101   OT Stop Time 1143   OT Time Calculation (min) 42 min   Activity Tolerance Patient tolerated treatment well      Past Medical History  Diagnosis Date  . Coronary artery disease   . Diabetes mellitus   . Stroke feb 2012    complication of cath  . Myocardial infarction 2011  . Gout   . Peripheral vascular disease   . Arthritis     Gout    Past Surgical History  Procedure Laterality Date  . Coronary artery bypass graft    . Femoral bypass Left Sept. 30, 2011    Left Fem to above knee Pop Artery BPG  . Cardiac catheterization      stent done feb 2012  . Vascular surgery      There were no vitals taken for this visit.  Visit Diagnosis:  Lack of coordination due to stroke  Hemiplegia affecting right dominant side  Ataxia      Subjective Assessment - 12/13/14 1106    Symptoms (p) "My exercises are going pretty well;  the therauputty is hard."   Pertinent History (p) see epic snapshot   Currently in Pain? (p) No/denies                 OT Treatments/Exercises (OP) - 12/13/14 0001    Hand Exercises   Other Hand Exercises Pt stated he was having difficulty with theraputty exercises at home. Had pt complete each exercise from HEP; pt needed only min vc's and encouragement.   Neurological Re-education Exercises   Other Exercises 1 Neuro re ed to address supination with low reach activities with empahsis on coordinated smooth movement with controlled reach.  Pt needs min facilitation to decrease abnormal movements to assist  with low reach.                   OT Short Term Goals - 12/13/14 1148    OT SHORT TERM GOAL #1   Title Pt and wife will be I with HEP - 12/23/2014   Status On-going   OT SHORT TERM GOAL #2   Title Pt will demonstrate improved fine motor control as evidenced by being able to manipulate moderately small objects within a functional task.   Status On-going   OT SHORT TERM GOAL #3   Title Pt will demonstrate improved ROM to 75% for supination in prep for improved functional use of RUE (currently 40% range)   Status On-going           OT Long Term Goals - 12/13/14 1148    OT LONG TERM GOAL #1   Title Pt  and wife will be I with upgraded HEP - 01/20/2015   Status On-going   OT LONG TERM GOAL #2   Title Pt will demonstrate improved coordination as evidenced by decreasing 9 hole peg test time by 8 seconds   Status On-going   OT LONG TERM GOAL #3   Title Pt will be able to hold and move a dinner plate  with his RUE.   Status On-going               Plan - 12/13/14 1147    Clinical Impression Statement Pt making progress torward goals and is compliant with HEP.   Rehab Potential Good   Clinical Impairments Affecting Rehab Potential Pt can benefit from skilled OT to address the following:  decreased ROM, decreased coordination (ataxia), altered tone, impaired functional use of RUE, pt/family education.   OT Frequency 2x / week   OT Duration 8 weeks   OT Treatment/Interventions Neuromuscular education;Therapeutic exercise;Therapeutic activities;Patient/family education;Manual Therapy   Plan neuro re ed to RUE        Problem List Patient Active Problem List   Diagnosis Date Noted  . Aftercare following surgery of the circulatory system 10/25/2014  . PVD (peripheral vascular disease) 10/25/2014  . Occlusion and stenosis of carotid artery without mention of cerebral infarction 11/16/2013  . Aftercare following surgery of the circulatory system, NEC 10/19/2013  .  Peripheral vascular disease, unspecified 10/16/2012  . Venous insufficiency 02/19/2012  . CVA, old, hemiparesis 02/18/2012  . CAD (coronary artery disease) of artery bypass graft 02/18/2012  . Cellulitis 02/17/2012  . HTN (hypertension) 02/17/2012  . 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 [place], [place], [address] Phone: [phone]   Fax:  [phone]

## 2014-12-13 NOTE — Therapy (Signed)
[place] [address] [place], [place], [address] Phone: [phone]   Fax:  [phone]  Physical Therapy Treatment  Patient Details  Name: [name] MRN: [id number] Date of Birth: 07/29/1946  Encounter Date: 12/13/2014      PT End of Session - 12/13/14 1651    Visit Number 18   Number of Visits 25   Date for PT Re-Evaluation 12/25/14   PT Start Time 0935   PT Stop Time 1020   PT Time Calculation (min) 45 min      Past Medical History  Diagnosis Date  . Coronary artery disease   . Diabetes mellitus   . Stroke feb 2012    complication of cath  . Myocardial infarction 2011  . Gout   . Peripheral vascular disease   . Arthritis     Gout    Past Surgical History  Procedure Laterality Date  . Coronary artery bypass graft    . Femoral bypass Left Sept. 30, 2011    Left Fem to above knee Pop Artery BPG  . Cardiac catheterization      stent done feb 2012  . Vascular surgery      There were no vitals taken for this visit.  Visit Diagnosis:  Abnormality of gait  Hemiplegia affecting right dominant side      Subjective Assessment - 12/13/14 1640    Symptoms Pt. reports he does alot of stretches at home; continues to wear AFO on RLE   Currently in Pain? No/denies                    [facility] Adult PT Treatment/Exercise - 12/13/14 1641    Ambulation/Gait   Ambulation/Gait Yes   Ambulation/Gait Assistance 5: Supervision   Ambulation/Gait Assistance Details cues for erect posture   Ambulation Distance (Feet) 100 Feet   Assistive device Rollator   Gait Pattern Decreased dorsiflexion - right;Step-through pattern   Dynamic Standing Balance   Dynamic Standing - Balance Activities --  tap ups with LLE to 2" step x 10 reps   Dynamic Standing - Comments pt. performed side stepping inside parallel bars with 1 UE support 10' x 2 reps   Lumbar Exercises: Stretches   Passive Hamstring  Stretch 30 seconds  RLE   Single Knee to Chest Stretch 30 seconds  RLE   Lower Trunk Rotation 30 seconds  to left side for R side stretching   Standing Extension 10 seconds  inside bars   Quadruped Mid Back Stretch 20 seconds  2 reps for right side stretching   Lumbar Exercises: Aerobic   Stationary Bike 5" scifit level 18.4 - no charge as unsupervised   Lumbar Exercises: Supine   Other Supine Lumbar Exercises tall kneeling with cues for extension and upright posture   Knee/Hip Exercises: Supine   Bridges 10 reps   Knee/Hip Exercises: Sidelying   Hip ABduction 10 reps;AROM  in hooklying; 10 reps in left sidelying with knee extended   Knee/Hip Exercises: Prone   Other Prone Exercises right hip extension with knee flexed x 10 reps with mod assist.     There Ex;  R 1/2 bridging x 10 reps: bridging with marching x 5 reps; bridging with hip abdct./adduction x 5 reps and bridging with RLE extension x 5 reps  Low back stretching with swiss ball in front, pushing forward with turn to left for R side stretching x 15 sec hold  PT Education - 12/13/14 1650    Education provided Yes   Education Details walk short distance in home once every hour   Person(s) Educated Patient;Spouse   Methods Explanation   Comprehension Verbalized understanding          PT Short Term Goals - 11/25/14 1752    PT SHORT TERM GOAL #1   Title Return demo HEP with written cues and wife's assistance   Baseline met 11-25-14   Status Achieved   PT SHORT TERM GOAL #2   Title Improve Berg balance test score to >/= 28/56 to reduce fall risk   Baseline met 10-28-14   Status Achieved   PT SHORT TERM GOAL #3   Title  Incr. gait speed by 0.5 ft/sec with LRAD   Baseline velocity 1.12 ft/sec   Status Not Met   PT SHORT TERM GOAL #4   Title Negotiate 4 steps x 3 reps iwth 1 handrail and S   Baseline met 11-25-14   Status Achieved           PT Long Term Goals - 12/09/14 1345    PT LONG TERM GOAL  #1   Title Amb. 250' with RW with SBA   Baseline 12-26-14   Time 3   Period Weeks   Status On-going   PT LONG TERM GOAL #2   Title Incr. gait velocity to >/= 1.4 ft/sec with RW   Time 3   Period Weeks   Status On-going   PT LONG TERM GOAL #3   Title Independent in updated HEP   Baseline 12-26-14   Time 3   Period Weeks   Status On-going               Plan - 12/13/14 1651    Clinical Impression Statement Pt. reported some slight back pain with unsupported standing and ambulation inside parallel bars; AFO improves safety with gait   Pt will benefit from skilled therapeutic intervention in order to improve on the following deficits Abnormal gait;Decreased endurance;Decreased coordination;Decreased strength;Decreased balance   Rehab Potential Good   PT Frequency 2x / week   PT Duration 2 weeks   PT Treatment/Interventions Gait training;Therapeutic exercise;Patient/family education;Balance training;Stair training;Functional mobility training;Neuromuscular re-education;Therapeutic activities   PT Next Visit Plan continue with RLE strengthening and stretching; standing balance as tolerated   PT Home Exercise Plan ambulate as much as tolerated   Consulted and Agree with Plan of Care Patient;Family member/caregiver        Problem List Patient Active Problem List   Diagnosis Date Noted  . Aftercare following surgery of the circulatory system 10/25/2014  . PVD (peripheral vascular disease) 10/25/2014  . Occlusion and stenosis of carotid artery without mention of cerebral infarction 11/16/2013  . Aftercare following surgery of the circulatory system, [place] 10/19/2013  . Peripheral vascular disease, unspecified 10/16/2012  . Venous insufficiency 02/19/2012  . CVA, old, hemiparesis 02/18/2012  . CAD (coronary artery disease) of artery bypass graft 02/18/2012  . Cellulitis 02/17/2012  . HTN (hypertension) 02/17/2012  . Poorly controlled type II diabetes mellitus with ophthalmic  complication 20/25/4270    Alda Lea PT 12/13/2014, 4:56 PM  [place] [address] [place], [place], [address] Phone: [phone]   Fax:  [phone]

## 2014-12-15 ENCOUNTER — Encounter: Payer: Self-pay | Admitting: Physical Therapy

## 2014-12-15 ENCOUNTER — Ambulatory Visit: Payer: Medicare Other | Admitting: Physical Therapy

## 2014-12-15 DIAGNOSIS — M6281 Muscle weakness (generalized): Secondary | ICD-10-CM | POA: Diagnosis not present

## 2014-12-15 DIAGNOSIS — R269 Unspecified abnormalities of gait and mobility: Secondary | ICD-10-CM

## 2014-12-15 DIAGNOSIS — Z951 Presence of aortocoronary bypass graft: Secondary | ICD-10-CM | POA: Diagnosis not present

## 2014-12-15 DIAGNOSIS — G8191 Hemiplegia, unspecified affecting right dominant side: Secondary | ICD-10-CM

## 2014-12-15 DIAGNOSIS — I252 Old myocardial infarction: Secondary | ICD-10-CM | POA: Diagnosis not present

## 2014-12-15 DIAGNOSIS — I69354 Hemiplegia and hemiparesis following cerebral infarction affecting left non-dominant side: Secondary | ICD-10-CM | POA: Diagnosis not present

## 2014-12-15 DIAGNOSIS — Z5189 Encounter for other specified aftercare: Secondary | ICD-10-CM | POA: Diagnosis not present

## 2014-12-15 NOTE — Therapy (Signed)
[place] [address] [place], [place], [address] Phone: [phone]   Fax:  [phone]  Physical Therapy Treatment  Patient Details  Name: [name] MRN: [id number] Date of Birth: May 25, 1946  Encounter Date: 12/15/2014      PT End of Session - 12/15/14 1254    Visit Number 19   Number of Visits 25   Date for PT Re-Evaluation 12/25/14   PT Start Time 0936   PT Stop Time 1023   PT Time Calculation (min) 47 min   Activity Tolerance Patient tolerated treatment well   Behavior During Therapy [facility] for tasks assessed/performed      Past Medical History  Diagnosis Date  . Coronary artery disease   . Diabetes mellitus   . Stroke feb 2012    complication of cath  . Myocardial infarction 2011  . Gout   . Peripheral vascular disease   . Arthritis     Gout    Past Surgical History  Procedure Laterality Date  . Coronary artery bypass graft    . Femoral bypass Left Sept. 30, 2011    Left Fem to above knee Pop Artery BPG  . Cardiac catheterization      stent done feb 2012  . Vascular surgery      There were no vitals taken for this visit.  Visit Diagnosis:  Hemiplegia affecting right dominant side  Abnormality of gait      Subjective Assessment - 12/15/14 1250    Symptoms Pt. states he took his [facility] to [facility] to get new velcro strap and they reported that AFO was broken and new one could be obtained with new order from MD   Currently in Pain? No/denies                    [facility] Adult PT Treatment/Exercise - 12/15/14 1251    Ambulation/Gait   Ambulation/Gait Yes   Ambulation/Gait Assistance 5: Supervision   Ambulation/Gait Assistance Details trialed toe off AFO on LLE and trialed [place] AFO on LLE - pt. gait trained 51' with each orthosis with CGA with use of RW   Ambulation Distance (Feet) 50 Feet  with each AFO   Assistive device Rollator   Gait Pattern Decreased  dorsiflexion - right     Pt. discouraged/tearful during therapy session regarding current functional status and difficulty walking; discussed D/C plans of beginning community exercise class and possibly aquatic therapy class at [facility]. Also informed pt. and wife of CVA support group which meets at [facility] on 4th Sunday of the month         PT Education - 12/15/14 1253    Education Details continue to walk as much as possible every 1-2 hours   Person(s) Educated Patient;Spouse   Methods Explanation   Comprehension Verbalized understanding          PT Short Term Goals - 11/25/14 1752    PT SHORT TERM GOAL #1   Title Return demo HEP with written cues and wife's assistance   Baseline met 11-25-14   Status Achieved   PT SHORT TERM GOAL #2   Title Improve Berg balance test score to >/= 28/56 to reduce fall risk   Baseline met 10-28-14   Status Achieved   PT SHORT TERM GOAL #3   Title  Incr. gait speed by 0.5 ft/sec with LRAD   Baseline velocity 1.12 ft/sec   Status Not Met   PT SHORT TERM  GOAL #4   Title Negotiate 4 steps x 3 reps iwth 1 handrail and S   Baseline met 11-25-14   Status Achieved           PT Long Term Goals - 12/09/14 1345    PT LONG TERM GOAL #1   Title Amb. 250' with RW with SBA   Baseline 12-26-14   Time 3   Period Weeks   Status On-going   PT LONG TERM GOAL #2   Title Incr. gait velocity to >/= 1.4 ft/sec with RW   Time 3   Period Weeks   Status On-going   PT LONG TERM GOAL #3   Title Independent in updated HEP   Baseline 12-26-14   Time 3   Period Weeks   Status On-going               Plan - 12/15/14 1256    Clinical Impression Statement pt.'s gait improved with use of [place] AFO on LLE; would benefit from a more supportive AFO than his current posterior leaf spring   Pt will benefit from skilled therapeutic intervention in order to improve on the following deficits Abnormal gait;Decreased endurance;Decreased  coordination;Decreased strength;Decreased balance   Rehab Potential Good   PT Frequency 2x / week   PT Duration 2 weeks   PT Treatment/Interventions Gait training;Therapeutic exercise;Patient/family education;Balance training;Stair training;Functional mobility training;Neuromuscular re-education;Therapeutic activities   PT Next Visit Plan continue with RLE strengthening and stretching; standing balance as tolerated   PT Home Exercise Plan ambulate as much as tolerated   Consulted and Agree with Plan of Care Patient;Family member/caregiver   Family Member Consulted spouse   PT Plan continue with exercises to strengthen and stretch RLE; standing balance as tolerated (limited by back pain)     Plan:  Request script from MD for new AFO   Problem List Patient Active Problem List   Diagnosis Date Noted  . Aftercare following surgery of the circulatory system 10/25/2014  . PVD (peripheral vascular disease) 10/25/2014  . Occlusion and stenosis of carotid artery without mention of cerebral infarction 11/16/2013  . Aftercare following surgery of the circulatory system, [place] 10/19/2013  . Peripheral vascular disease, unspecified 10/16/2012  . Venous insufficiency 02/19/2012  . CVA, old, hemiparesis 02/18/2012  . CAD (coronary artery disease) of artery bypass graft 02/18/2012  . Cellulitis 02/17/2012  . HTN (hypertension) 02/17/2012  . 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

## 2014-12-22 ENCOUNTER — Ambulatory Visit: Payer: Medicare Other | Admitting: Occupational Therapy

## 2014-12-22 DIAGNOSIS — R269 Unspecified abnormalities of gait and mobility: Secondary | ICD-10-CM | POA: Diagnosis not present

## 2014-12-22 DIAGNOSIS — G8191 Hemiplegia, unspecified affecting right dominant side: Secondary | ICD-10-CM

## 2014-12-22 DIAGNOSIS — IMO0002 Reserved for concepts with insufficient information to code with codable children: Secondary | ICD-10-CM

## 2014-12-22 DIAGNOSIS — Z5189 Encounter for other specified aftercare: Secondary | ICD-10-CM | POA: Diagnosis not present

## 2014-12-22 DIAGNOSIS — I252 Old myocardial infarction: Secondary | ICD-10-CM | POA: Diagnosis not present

## 2014-12-22 DIAGNOSIS — F4320 Adjustment disorder, unspecified: Secondary | ICD-10-CM

## 2014-12-22 DIAGNOSIS — Z951 Presence of aortocoronary bypass graft: Secondary | ICD-10-CM | POA: Diagnosis not present

## 2014-12-22 DIAGNOSIS — M6281 Muscle weakness (generalized): Secondary | ICD-10-CM | POA: Diagnosis not present

## 2014-12-22 DIAGNOSIS — G8110 Spastic hemiplegia affecting unspecified side: Secondary | ICD-10-CM

## 2014-12-22 DIAGNOSIS — I69354 Hemiplegia and hemiparesis following cerebral infarction affecting left non-dominant side: Secondary | ICD-10-CM | POA: Diagnosis not present

## 2014-12-22 NOTE — Therapy (Signed)
[facility] [address] [place], [place], [address] Phone: [phone]   Fax:  [phone]  Occupational Therapy Treatment  Patient Details  Name: Craig DeedClarence M Morneault MRN: [id number] Date of Birth: 18-Jan-1946  Encounter Date: 12/22/2014      OT End of Session - 12/22/14 1113    Number of Visits 16   Date for OT Re-Evaluation 01/20/15   OT Start Time 1104   OT Stop Time 1145   OT Time Calculation (min) 41 min      Past Medical History  Diagnosis Date  . Coronary artery disease   . Diabetes mellitus   . Stroke feb 2012    complication of cath  . Myocardial infarction 2011  . Gout   . Peripheral vascular disease   . Arthritis     Gout    Past Surgical History  Procedure Laterality Date  . Coronary artery bypass graft    . Femoral bypass Left Sept. 30, 2011    Left Fem to above knee Pop Artery BPG  . Cardiac catheterization      stent done feb 2012  . Vascular surgery      There were no vitals taken for this visit.  Visit Diagnosis:  Hemiplegia affecting right dominant side  Lack of coordination due to stroke  Spastic hemiplegia affecting nondominant side      Subjective Assessment - 12/22/14 1113    Currently in Pain? No/denies          Treatment:  Flipping playing cards and dealing with thumb for RUE to encourage fine motor coordination and supination. Functional grasp release of checkers to place in connect four frame, min v.c. Supine closed chain shoulder flexion and and chest press with PVC pipe frame, min facillitation/ v.c. Quadraped for weightbearing with triangle under bilateral UE's due to RUE spasiticity. Standing to weightbear through bilateral UE's over a  chair, with gentle lateral weight sifts, min facillitation.                OT Short Term Goals - 12/13/14 1148    OT SHORT TERM GOAL #1   Title Pt and wife will be I with HEP - 12/23/2014   Status On-going   OT  SHORT TERM GOAL #2   Title Pt will demonstrate improved fine motor control as evidenced by being able to manipulate moderately small objects within a functional task.   Status On-going   OT SHORT TERM GOAL #3   Title Pt will demonstrate improved ROM to 75% for supination in prep for improved functional use of RUE (currently 40% range)   Status On-going           OT Long Term Goals - 12/13/14 1148    OT LONG TERM GOAL #1   Title Pt  and wife will be I with upgraded HEP - 01/20/2015   Status On-going   OT LONG TERM GOAL #2   Title Pt will demonstrate improved coordination as evidenced by decreasing 9 hole peg test time by 8 seconds   Status On-going   OT LONG TERM GOAL #3   Title Pt will be able to hold and move a dinner plate with his RUE.   Status On-going               Plan - 12/22/14 1730    Clinical Impression Statement Pt is progressing towards goals and is highly motivated.   Rehab Potential Good   Clinical Impairments Affecting Rehab  Potential Pt can benefit from skilled OT to address the following:  decreased ROM, decreased coordination (ataxia), altered tone, impaired functional use of RUE, pt/family education.   OT Frequency 2x / week   OT Duration 8 weeks   OT Treatment/Interventions Neuromuscular education;Therapeutic exercise;Therapeutic activities;Patient/family education;Manual Therapy;Therapeutic exercises;DME and/or AE instruction;Self-care/ADL training   Plan neuro re-ed   Consulted and Agree with Plan of Care Patient        Problem List Patient Active Problem List   Diagnosis Date Noted  . Aftercare following surgery of the circulatory system 10/25/2014  . PVD (peripheral vascular disease) 10/25/2014  . Occlusion and stenosis of carotid artery without mention of cerebral infarction 11/16/2013  . Aftercare following surgery of the circulatory system, [facility] 10/19/2013  . Peripheral vascular disease, unspecified 10/16/2012  . Venous insufficiency  02/19/2012  . CVA, old, hemiparesis 02/18/2012  . CAD (coronary artery disease) of artery bypass graft 02/18/2012  . Cellulitis 02/17/2012  . HTN (hypertension) 02/17/2012  . 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

## 2014-12-29 ENCOUNTER — Ambulatory Visit: Payer: Medicare Other | Attending: Physical Medicine & Rehabilitation | Admitting: Occupational Therapy

## 2014-12-29 DIAGNOSIS — I69354 Hemiplegia and hemiparesis following cerebral infarction affecting left non-dominant side: Secondary | ICD-10-CM | POA: Diagnosis not present

## 2014-12-29 DIAGNOSIS — Z5189 Encounter for other specified aftercare: Secondary | ICD-10-CM | POA: Diagnosis not present

## 2014-12-29 DIAGNOSIS — Z951 Presence of aortocoronary bypass graft: Secondary | ICD-10-CM | POA: Insufficient documentation

## 2014-12-29 DIAGNOSIS — I252 Old myocardial infarction: Secondary | ICD-10-CM | POA: Insufficient documentation

## 2014-12-29 DIAGNOSIS — R269 Unspecified abnormalities of gait and mobility: Secondary | ICD-10-CM | POA: Diagnosis not present

## 2014-12-29 DIAGNOSIS — IMO0002 Reserved for concepts with insufficient information to code with codable children: Secondary | ICD-10-CM

## 2014-12-29 DIAGNOSIS — M6281 Muscle weakness (generalized): Secondary | ICD-10-CM | POA: Insufficient documentation

## 2014-12-29 DIAGNOSIS — G8191 Hemiplegia, unspecified affecting right dominant side: Secondary | ICD-10-CM

## 2014-12-29 NOTE — Therapy (Signed)
[facility] [address] [place], [place], [address] Phone: [phone]   Fax:  [phone]  Occupational Therapy Treatment  Patient Details  Name: Craig DeedClarence M Laursen MRN: [id number] Date of Birth: Apr 12, 1946 Referring Provider:  Georgann HousekeeperHusain, Karrar, MD  Encounter Date: 12/29/2014      OT End of Session - 12/29/14 1220    Visit Number 6   Number of Visits 16   Date for OT Re-Evaluation 01/20/15   Authorization Type MCR   OT Start Time 1155   OT Stop Time 1230   OT Time Calculation (min) 35 min   Activity Tolerance Patient tolerated treatment well   Behavior During Therapy [facility] for tasks assessed/performed      Past Medical History  Diagnosis Date  . Coronary artery disease   . Diabetes mellitus   . Stroke feb 2012    complication of cath  . Myocardial infarction 2011  . Gout   . Peripheral vascular disease   . Arthritis     Gout    Past Surgical History  Procedure Laterality Date  . Coronary artery bypass graft    . Femoral bypass Left Sept. 30, 2011    Left Fem to above knee Pop Artery BPG  . Cardiac catheterization      stent done feb 2012  . Vascular surgery      There were no vitals taken for this visit.  Visit Diagnosis:  Hemiplegia affecting right dominant side  Lack of coordination due to stroke      Subjective Assessment - 12/29/14 1541    Currently in Pain? No/denies       Treatment:  Functional grasp release of checkers to place in connect four frame, min v.c. to avoid compensation Supine closed chain shoulder flexion and and chest press with PVC pipe frame, min facillitation/ v.c. Prone on elbows for modified plank x 10 reps min facillitation for right side activation. Education with pt. regarding exercises to perform at home.Therapist cautioned pt against use of lat pulldown and butterfly with weights due to risk for shoulder injury.                   OT Short Term  Goals - 12/13/14 1148    OT SHORT TERM GOAL #1   Title Pt and wife will be I with HEP - 12/23/2014   Status On-going   OT SHORT TERM GOAL #2   Title Pt will demonstrate improved fine motor control as evidenced by being able to manipulate moderately small objects within a functional task.   Status On-going   OT SHORT TERM GOAL #3   Title Pt will demonstrate improved ROM to 75% for supination in prep for improved functional use of RUE (currently 40% range)   Status On-going           OT Long Term Goals - 12/13/14 1148    OT LONG TERM GOAL #1   Title Pt  and wife will be I with upgraded HEP - 01/20/2015   Status On-going   OT LONG TERM GOAL #2   Title Pt will demonstrate improved coordination as evidenced by decreasing 9 hole peg test time by 8 seconds   Status On-going   OT LONG TERM GOAL #3   Title Pt will be able to hold and move a dinner plate with his RUE.   Status On-going               Plan - 12/29/14 1221  Clinical Impression Statement Pt is progressing towards goals.   Rehab Potential Good   Clinical Impairments Affecting Rehab Potential Pt can benefit from skilled OT to address the following:  decreased ROM, decreased coordination (ataxia), altered tone, impaired functional use of RUE, pt/family education.   OT Frequency 2x / week   OT Duration 8 weeks   OT Treatment/Interventions Neuromuscular education;Therapeutic exercise;Therapeutic activities;Patient/family education;Manual Therapy;Therapeutic exercises;DME and/or AE instruction;Self-care/ADL training   Plan  review exercises with ball, progress HEP   Consulted and Agree with Plan of Care Patient;Family member/caregiver   Family Member Consulted wife        Problem List Patient Active Problem List   Diagnosis Date Noted  . Aftercare following surgery of the circulatory system 10/25/2014  . PVD (peripheral vascular disease) 10/25/2014  . Occlusion and stenosis of carotid artery without mention of  cerebral infarction 11/16/2013  . Aftercare following surgery of the circulatory system, [facility] 10/19/2013  . Peripheral vascular disease, unspecified 10/16/2012  . Venous insufficiency 02/19/2012  . CVA, old, hemiparesis 02/18/2012  . CAD (coronary artery disease) of artery bypass graft 02/18/2012  . Cellulitis 02/17/2012  . HTN (hypertension) 02/17/2012  . 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

## 2014-12-30 DIAGNOSIS — H25012 Cortical age-related cataract, left eye: Secondary | ICD-10-CM | POA: Diagnosis not present

## 2014-12-30 DIAGNOSIS — H2512 Age-related nuclear cataract, left eye: Secondary | ICD-10-CM | POA: Diagnosis not present

## 2014-12-30 DIAGNOSIS — E119 Type 2 diabetes mellitus without complications: Secondary | ICD-10-CM | POA: Diagnosis not present

## 2014-12-31 ENCOUNTER — Ambulatory Visit: Payer: Medicare Other | Admitting: Occupational Therapy

## 2014-12-31 DIAGNOSIS — IMO0002 Reserved for concepts with insufficient information to code with codable children: Secondary | ICD-10-CM

## 2014-12-31 DIAGNOSIS — G8110 Spastic hemiplegia affecting unspecified side: Secondary | ICD-10-CM

## 2014-12-31 DIAGNOSIS — Z951 Presence of aortocoronary bypass graft: Secondary | ICD-10-CM | POA: Diagnosis not present

## 2014-12-31 DIAGNOSIS — I252 Old myocardial infarction: Secondary | ICD-10-CM | POA: Diagnosis not present

## 2014-12-31 DIAGNOSIS — M6281 Muscle weakness (generalized): Secondary | ICD-10-CM | POA: Diagnosis not present

## 2014-12-31 DIAGNOSIS — R269 Unspecified abnormalities of gait and mobility: Secondary | ICD-10-CM | POA: Diagnosis not present

## 2014-12-31 DIAGNOSIS — R270 Ataxia, unspecified: Secondary | ICD-10-CM

## 2014-12-31 DIAGNOSIS — I69354 Hemiplegia and hemiparesis following cerebral infarction affecting left non-dominant side: Secondary | ICD-10-CM | POA: Diagnosis not present

## 2014-12-31 DIAGNOSIS — Z5189 Encounter for other specified aftercare: Secondary | ICD-10-CM | POA: Diagnosis not present

## 2014-12-31 NOTE — Patient Instructions (Signed)
While laying on your back , Hold ball( 8 lbs) with both hands, keep elbows straight raise arms overhead,  slowly lower arms back down to stomach 2 sets of 10 reps 1x day  Turn your forearm and palm up/ down, assisting with your left hand, then try to flip cards with your right hand , exaggerate turning palm up/ down 20 reps each

## 2014-12-31 NOTE — Therapy (Signed)
[facility] [address] [place], [place], [address] Phone: [phone]   Fax:  [phone]  Occupational Therapy Treatment  Patient Details  Name: [name] MRN: [id number] Date of Birth: May 28, 1946 Referring Provider:  Georgann Housekeeper, MD  Encounter Date: 12/31/2014      OT End of Session - 12/31/14 1152    Visit Number 7   Number of Visits 16   Date for OT Re-Evaluation 01/20/15   Authorization Type MCR   OT Start Time 1103   OT Stop Time 1154   OT Time Calculation (min) 51 min   Activity Tolerance Patient tolerated treatment well   Behavior During Therapy [facility] for tasks assessed/performed      Past Medical History  Diagnosis Date  . Coronary artery disease   . Diabetes mellitus   . Stroke feb 2012    complication of cath  . Myocardial infarction 2011  . Gout   . Peripheral vascular disease   . Arthritis     Gout    Past Surgical History  Procedure Laterality Date  . Coronary artery bypass graft    . Femoral bypass Left Sept. 30, 2011    Left Fem to above knee Pop Artery BPG  . Cardiac catheterization      stent done feb 2012  . Vascular surgery      There were no vitals taken for this visit.  Visit Diagnosis:  Lack of coordination due to stroke  Spastic hemiplegia affecting nondominant side  Ataxia      Subjective Assessment - 12/31/14 1152    Currently in Pain? No/denies        Treatment: Supine:Holding 8 lb weight with bilateral UE's shoulder flexion x 15 reps, min v.c. Facillitation, Followed by chest press x 5 reps, then 5 reps of slowed shoulder flexion pausing mid range. A/ROM, AA/ROM supination pronation followed by flipping playing cards to encourage supination. UBE x 7 mins level 3 for reciprocal movement.                 OT Education - 12/31/14 1742    Education provided Yes   Education Details HEP using weighted ball at home supine(pt used 8 lb  weight in clinic), supination/ pronation   Person(s) Educated Patient;Spouse   Methods Explanation;Demonstration   Comprehension Verbalized understanding;Returned demonstration          OT Short Term Goals - 12/13/14 1148    OT SHORT TERM GOAL #1   Title Pt and wife will be I with HEP - 12/23/2014   Status On-going   OT SHORT TERM GOAL #2   Title Pt will demonstrate improved fine motor control as evidenced by being able to manipulate moderately small objects within a functional task.   Status On-going   OT SHORT TERM GOAL #3   Title Pt will demonstrate improved ROM to 75% for supination in prep for improved functional use of RUE (currently 40% range)   Status On-going           OT Long Term Goals - 12/13/14 1148    OT LONG TERM GOAL #1   Title Pt  and wife will be I with upgraded HEP - 01/20/2015   Status On-going   OT LONG TERM GOAL #2   Title Pt will demonstrate improved coordination as evidenced by decreasing 9 hole peg test time by 8 seconds   Status On-going   OT LONG TERM GOAL #3   Title Pt will  be able to hold and move a dinner plate with his RUE.   Status On-going               Plan - 12/31/14 1744    Clinical Impression Statement Pt is progressing towards goals and can benefit from continued skilled OT   Rehab Potential Good   Clinical Impairments Affecting Rehab Potential Pt can benefit from skilled OT to address the following:  decreased ROM, decreased coordination (ataxia), altered tone, impaired functional use of RUE, pt/family education.   OT Frequency 2x / week   OT Duration 8 weeks   OT Treatment/Interventions Neuromuscular education;Therapeutic exercise;Therapeutic activities;Patient/family education;Manual Therapy;Therapeutic exercises;DME and/or AE instruction;Self-care/ADL training   Plan RUE functional use, neuro re-ed   Consulted and Agree with Plan of Care Patient;Family member/caregiver        Problem List Patient Active Problem List    Diagnosis Date Noted  . Aftercare following surgery of the circulatory system 10/25/2014  . PVD (peripheral vascular disease) 10/25/2014  . Occlusion and stenosis of carotid artery without mention of cerebral infarction 11/16/2013  . Aftercare following surgery of the circulatory system, [facility] 10/19/2013  . Peripheral vascular disease, unspecified 10/16/2012  . Venous insufficiency 02/19/2012  . CVA, old, hemiparesis 02/18/2012  . CAD (coronary artery disease) of artery bypass graft 02/18/2012  . Cellulitis 02/17/2012  . HTN (hypertension) 02/17/2012  . 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

## 2015-01-04 ENCOUNTER — Ambulatory Visit: Payer: Medicare Other | Admitting: Physical Therapy

## 2015-01-04 ENCOUNTER — Encounter: Payer: Self-pay | Admitting: Physical Therapy

## 2015-01-04 ENCOUNTER — Encounter: Payer: Self-pay | Admitting: Occupational Therapy

## 2015-01-04 ENCOUNTER — Ambulatory Visit: Payer: Medicare Other | Admitting: Occupational Therapy

## 2015-01-04 DIAGNOSIS — R270 Ataxia, unspecified: Secondary | ICD-10-CM

## 2015-01-04 DIAGNOSIS — G8110 Spastic hemiplegia affecting unspecified side: Secondary | ICD-10-CM

## 2015-01-04 DIAGNOSIS — Z951 Presence of aortocoronary bypass graft: Secondary | ICD-10-CM | POA: Diagnosis not present

## 2015-01-04 DIAGNOSIS — F4320 Adjustment disorder, unspecified: Secondary | ICD-10-CM | POA: Diagnosis not present

## 2015-01-04 DIAGNOSIS — M6281 Muscle weakness (generalized): Secondary | ICD-10-CM | POA: Diagnosis not present

## 2015-01-04 DIAGNOSIS — G8191 Hemiplegia, unspecified affecting right dominant side: Secondary | ICD-10-CM

## 2015-01-04 DIAGNOSIS — I252 Old myocardial infarction: Secondary | ICD-10-CM | POA: Diagnosis not present

## 2015-01-04 DIAGNOSIS — R269 Unspecified abnormalities of gait and mobility: Secondary | ICD-10-CM | POA: Diagnosis not present

## 2015-01-04 DIAGNOSIS — IMO0002 Reserved for concepts with insufficient information to code with codable children: Secondary | ICD-10-CM

## 2015-01-04 DIAGNOSIS — I69354 Hemiplegia and hemiparesis following cerebral infarction affecting left non-dominant side: Secondary | ICD-10-CM | POA: Diagnosis not present

## 2015-01-04 DIAGNOSIS — Z5189 Encounter for other specified aftercare: Secondary | ICD-10-CM | POA: Diagnosis not present

## 2015-01-04 NOTE — Therapy (Signed)
[facility] [address] [place], [place], [address] Phone: [phone]   Fax:  [phone]  Occupational Therapy Treatment  Patient Details  Name: [name] MRN: [id number] Date of Birth: 1946-10-11 Referring Provider:  Wenda Low, MD  Encounter Date: 01/04/2015      OT End of Session - 01/04/15 1247    Visit Number 8   Number of Visits 16   Date for OT Re-Evaluation 01/20/15   Authorization Type MCR   OT Start Time 0932   OT Stop Time 1014   OT Time Calculation (min) 42 min      Past Medical History  Diagnosis Date  . Coronary artery disease   . Diabetes mellitus   . Stroke feb 2012    complication of cath  . Myocardial infarction 2011  . Gout   . Peripheral vascular disease   . Arthritis     Gout    Past Surgical History  Procedure Laterality Date  . Coronary artery bypass graft    . Femoral bypass Left Sept. 30, 2011    Left Fem to above knee Pop Artery BPG  . Cardiac catheterization      stent done feb 2012  . Vascular surgery      There were no vitals taken for this visit.  Visit Diagnosis:  Lack of coordination due to stroke  Hemiplegia affecting right dominant side  Ataxia      Subjective Assessment - 01/04/15 0937    Symptoms (p) "I think my patience is shorter"   Pertinent History (p) see epic snapshot   Currently in Pain? (p) No/denies                 OT Treatments/Exercises (OP) - 01/04/15 1238    Exercises   Exercises Wrist  and forearm   Wrist Exercises   Forearm Supination AAROM;20 reps  holding for slow count of five   Other wrist exercises Pt has been instructed in this exercise prior. Although pt reports he is doing this at home, pt needed demonstration and cues.  Suspect pt is not very compliant with HEP.  Discussed importance of consistency with all HEP in order to achieve goals.  Pt verbalized understanding.     Fine Motor Coordination    Fine Motor Coordination In hand manipuation training;Maneuvering Blocks  picking/placing small objects with increasing speed/accuracy   Moist Heat Therapy   Number Minutes Moist Heat 10 Minutes   Moist Heat Location Other (comment)  R forearm; simulataneously with manual therapy   Manual Therapy   Manual Therapy Joint mobilization;Myofascial release   Joint Mobilization wrist   Myofascial Release forearm                OT Education - 01/04/15 1246    Education provided Yes   Education Details HEP   Person(s) Educated Patient;Spouse   Methods Explanation;Demonstration   Comprehension Verbalized understanding;Returned demonstration          OT Short Term Goals - 01/04/15 1251    OT SHORT TERM GOAL #1   Title Pt and wife will be I with HEP - 12/23/2014   Status Partially Met  needed cues for one exercise   OT SHORT TERM GOAL #2   Title Pt will demonstrate improved fine motor control as evidenced by being able to manipulate moderately small objects within a functional task.   Status Achieved   OT SHORT TERM GOAL #3   Title Pt will demonstrate  improved ROM to 75% for supination in prep for improved functional use of RUE (currently 40% range)   Status Partially Met  pt with approxiametly 60% of range           OT Long Term Goals - 01/04/15 1252    OT LONG TERM GOAL #1   Title Pt  and wife will be I with upgraded HEP - 01/20/2015   Status On-going   OT LONG TERM GOAL #2   Title Pt will demonstrate improved coordination as evidenced by decreasing 9 hole peg test time by 8 seconds   Status On-going   OT LONG TERM GOAL #3   Title Pt will be able to hold and move a dinner plate with his RUE.   Status On-going               Plan - 01/04/15 1250    Clinical Impression Statement Pt making some progress toward goals however suspect pt is not consistent with completing HEP.  Discussed importance of this should pt wish to meet goals. Pt verbalized understanding.    Rehab Potential Good   Clinical Impairments Affecting Rehab Potential Pt can benefit from skilled OT to address the following:  decreased ROM, decreased coordination (ataxia), altered tone, impaired functional use of RUE, pt/family education.   OT Frequency 2x / week   OT Duration 8 weeks   OT Treatment/Interventions Neuromuscular education;Therapeutic exercise;Therapeutic activities;Patient/family education;Manual Therapy;Therapeutic exercises;DME and/or AE instruction;Self-care/ADL training   Plan coordinaton activities, address supination AROM        Problem List Patient Active Problem List   Diagnosis Date Noted  . Aftercare following surgery of the circulatory system 10/25/2014  . PVD (peripheral vascular disease) 10/25/2014  . Occlusion and stenosis of carotid artery without mention of cerebral infarction 11/16/2013  . Aftercare following surgery of the circulatory system, [place] 10/19/2013  . Peripheral vascular disease, unspecified 10/16/2012  . Venous insufficiency 02/19/2012  . CVA, old, hemiparesis 02/18/2012  . CAD (coronary artery disease) of artery bypass graft 02/18/2012  . Cellulitis 02/17/2012  . HTN (hypertension) 02/17/2012  . 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 [place], [place], [address] Phone: [phone]   Fax:  [phone]

## 2015-01-04 NOTE — Patient Instructions (Signed)
Pt again instructed in AAROM for supination.  Wife also instructed and has agreed to assist pt as needed.  Stressed importance of HEP.

## 2015-01-04 NOTE — Therapy (Signed)
[facility] [address] [place], [place], [address] Phone: [phone]   Fax:  [phone]  Physical Therapy Treatment  Patient Details  Name: [name] MRN: [id number] Date of Birth: [birth year] Referring Provider:  Wenda Low, MD  Encounter Date: 01/04/2015      PT End of Session - 01/04/15 2147    Visit Number 20   Number of Visits 28   Date for PT Re-Evaluation 02/03/15   Authorization Type Medicare   Authorization Time Period 01-03-15 - 03-04-15   PT Start Time 1017   PT Stop Time 1105   PT Time Calculation (min) 48 min      Past Medical History  Diagnosis Date  . Coronary artery disease   . Diabetes mellitus   . Stroke feb 2012    complication of cath  . Myocardial infarction 2011  . Gout   . Peripheral vascular disease   . Arthritis     Gout    Past Surgical History  Procedure Laterality Date  . Coronary artery bypass graft    . Femoral bypass Left Sept. 30, 2011    Left Fem to above knee Pop Artery BPG  . Cardiac catheterization      stent done feb 2012  . Vascular surgery      There were no vitals taken for this visit.  Visit Diagnosis:  Spastic hemiplegia affecting nondominant side - Plan: PT plan of care cert/re-cert  Abnormality of gait - Plan: PT plan of care cert/re-cert      Subjective Assessment - 01/04/15 2138    Symptoms pt. reports doing exercises at home and walking more - during commercials while watching TV   Currently in Pain? No/denies                    [facility] Adult PT Treatment/Exercise - 01/04/15 1240    Ambulation/Gait   Ambulation/Gait Yes   Ambulation/Gait Assistance 5: Supervision   Ambulation Distance (Feet) 250 Feet   Assistive device Rollator   Gait Pattern Step-to pattern;Trunk flexed;Decreased dorsiflexion - right;Decreased hip/knee flexion - right   Gait velocity 1.06   Stairs Yes   Stairs Assistance 4: Min guard   Stair  Management Technique Two rails;Step to pattern   Number of Stairs 4   Height of Stairs 6   Timed Up and Go Test   Normal TUG (seconds) 35.09   Lumbar Exercises: Stretches   Lower Trunk Rotation 1 rep;30 seconds   Lumbar Exercises: Supine   Clam 10 reps  RLE   Bent Knee Raise 10 reps   Bridge 10 reps   Straight Leg Raise 10 reps     LTG's assessed; pt. In need of new AFO due to current AFO broken; will request script from MD  TherEx:  R hip flex/extension off side of mat 10 reps each with 3# weight; SLR RLE with 2# weight 10 reps each Prone right knee flexion with mid to mod assist x 10 reps; prone right hip ext with knee flexed at 90 degrees x 10 reps           PT Short Term Goals - 11/25/14 1752    PT SHORT TERM GOAL #1   Title Return demo HEP with written cues and wife's assistance   Baseline met 11-25-14   Status Achieved   PT SHORT TERM GOAL #2   Title Improve Berg balance test score to >/= 28/56 to reduce fall risk  Baseline met 10-28-14   Status Achieved   PT SHORT TERM GOAL #3   Title  Incr. gait speed by 0.5 ft/sec with LRAD   Baseline velocity 1.12 ft/sec   Status Not Met   PT SHORT TERM GOAL #4   Title Negotiate 4 steps x 3 reps iwth 1 handrail and S   Baseline met 11-25-14   Status Achieved           PT Long Term Goals - 01/17/2015 2151    PT LONG TERM GOAL #1   Title Amb. 250' with RW with SBA   Baseline met January 17, 2015   Status Achieved   PT LONG TERM GOAL #2   Title Incr. gait velocity to >/= 1.4 ft/sec with RW   Baseline 1.06 ft/sec on January 17, 2015   Status Not Met   PT LONG TERM GOAL #3   Title Independent in updated HEP   Status Achieved   PT LONG TERM GOAL #4   Title Amb. 350' with new AFO on RLE with RW with S.   Baseline target date 02-04-15   Time 4   Period Weeks   Status New   PT LONG TERM GOAL #5   Title Increase gait velocity to >/= 1.4 ft/sec with RW   Baseline target date 02-04-15   Time 4   Period Weeks   Status On-going    Additional Long Term Goals   Additional Long Term Goals Yes   PT LONG TERM GOAL #6   Title Negotiate steps with 2 rails with pt. reporting incr. R hip flexion during ascension with step negotiation at home   Baseline target 02-04-15   Time 4   Period Weeks   Status New               Plan - 01/17/2015 2149    Clinical Impression Statement Pt. met LTG's #1 & 3:  #2 not met as gait speed remains decr. at 1.06 ft/sec with use of RW; pt. would benefit from  new AFO for RLE   Pt will benefit from skilled therapeutic intervention in order to improve on the following deficits Abnormal gait;Decreased endurance;Decreased coordination;Decreased strength;Decreased balance   PT Frequency 2x / week   PT Duration 4 weeks   PT Treatment/Interventions Gait training;Therapeutic exercise;Patient/family education;Balance training;Stair training;Functional mobility training;Neuromuscular re-education;Therapeutic activities   PT Next Visit Plan continue with RLE strengthening and stretching; standing balance as tolerated   PT Home Exercise Plan ambulate as much as tolerated   Consulted and Agree with Plan of Care Patient;Family member/caregiver   PT Plan continue with exercises to strengthen and stretch RLE; standing balance as tolerated (limited by back pain)          G-Codes - Jan 17, 2015 2156    Functional Assessment Tool Used TUG with RW 35.09 secs:  gait velocity 1.06 ft/sec with RW   Functional Limitation Mobility: Walking and moving around   Mobility: Walking and Moving Around Current Status [phone]) At least 60 percent but less than 80 percent impaired, limited or restricted   Mobility: Walking and Moving Around Goal Status [phone]) At least 40 percent but less than 60 percent impaired, limited or restricted      Problem List Patient Active Problem List   Diagnosis Date Noted  . Aftercare following surgery of the circulatory system 10/25/2014  . PVD (peripheral vascular disease) 10/25/2014  .  Occlusion and stenosis of carotid artery without mention of cerebral infarction 11/16/2013  . Aftercare following surgery of the  circulatory system, [place] 10/19/2013  . Peripheral vascular disease, unspecified 10/16/2012  . Venous insufficiency 02/19/2012  . CVA, old, hemiparesis 02/18/2012  . CAD (coronary artery disease) of artery bypass graft 02/18/2012  . Cellulitis 02/17/2012  . HTN (hypertension) 02/17/2012  . 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 [place], [place], [address] Phone: [phone]   Fax:  [phone]

## 2015-01-06 ENCOUNTER — Ambulatory Visit: Payer: Medicare Other | Admitting: Physical Therapy

## 2015-01-06 ENCOUNTER — Encounter: Payer: Self-pay | Admitting: Occupational Therapy

## 2015-01-06 ENCOUNTER — Ambulatory Visit: Payer: Medicare Other | Admitting: Occupational Therapy

## 2015-01-06 DIAGNOSIS — G8191 Hemiplegia, unspecified affecting right dominant side: Secondary | ICD-10-CM

## 2015-01-06 DIAGNOSIS — I252 Old myocardial infarction: Secondary | ICD-10-CM | POA: Diagnosis not present

## 2015-01-06 DIAGNOSIS — R269 Unspecified abnormalities of gait and mobility: Secondary | ICD-10-CM

## 2015-01-06 DIAGNOSIS — Z951 Presence of aortocoronary bypass graft: Secondary | ICD-10-CM | POA: Diagnosis not present

## 2015-01-06 DIAGNOSIS — I69354 Hemiplegia and hemiparesis following cerebral infarction affecting left non-dominant side: Secondary | ICD-10-CM | POA: Diagnosis not present

## 2015-01-06 DIAGNOSIS — IMO0002 Reserved for concepts with insufficient information to code with codable children: Secondary | ICD-10-CM

## 2015-01-06 DIAGNOSIS — Z5189 Encounter for other specified aftercare: Secondary | ICD-10-CM | POA: Diagnosis not present

## 2015-01-06 DIAGNOSIS — R270 Ataxia, unspecified: Secondary | ICD-10-CM

## 2015-01-06 DIAGNOSIS — M6281 Muscle weakness (generalized): Secondary | ICD-10-CM | POA: Diagnosis not present

## 2015-01-06 NOTE — Therapy (Signed)
[facility] [address] [place], [place], [address] Phone: [phone]   Fax:  [phone]  Occupational Therapy Treatment  Patient Details  Name: [name] MRN: [id number] Date of Birth: 11/03/1946 Referring Provider:  Wenda Low, MD  Encounter Date: 01/06/2015      OT End of Session - 01/06/15 1156    Visit Number 9   Number of Visits 16   Date for OT Re-Evaluation 01/20/15   Authorization Type MCR   OT Start Time 0931   OT Stop Time 1014   OT Time Calculation (min) 43 min   Activity Tolerance Patient tolerated treatment well      Past Medical History  Diagnosis Date  . Coronary artery disease   . Diabetes mellitus   . Stroke feb 2012    complication of cath  . Myocardial infarction 2011  . Gout   . Peripheral vascular disease   . Arthritis     Gout    Past Surgical History  Procedure Laterality Date  . Coronary artery bypass graft    . Femoral bypass Left Sept. 30, 2011    Left Fem to above knee Pop Artery BPG  . Cardiac catheterization      stent done feb 2012  . Vascular surgery      There were no vitals taken for this visit.  Visit Diagnosis:  Lack of coordination due to stroke  Hemiplegia affecting right dominant side  Ataxia      Subjective Assessment - 01/06/15 0938    Symptoms "I am trying to do the exercises better"   Pertinent History see epic snapshot   Currently in Pain? No/denies                 OT Treatments/Exercises (OP) - 01/06/15 0001    Fine Motor Coordination   Fine Motor Coordination In hand manipuation training   In Hand Manipulation Training Therapeutic activities to address fine motor control with emphasis on in hand manipulation of small objects including turning, twisting and changing orientation of object relative to other objects.  Also worked on picking up small objects from flat table and placing in small spaced.  Emphasis also on  sustained use of RUE in functional task                  OT Short Term Goals - 01/06/15 1203    OT SHORT TERM GOAL #1   Title Pt and wife will be I with HEP - 12/23/2014   Status Partially Met  needed cues for one exercise   OT SHORT TERM GOAL #2   Title Pt will demonstrate improved fine motor control as evidenced by being able to manipulate moderately small objects within a functional task.   Status Achieved   OT SHORT TERM GOAL #3   Title Pt will demonstrate improved ROM to 75% for supination in prep for improved functional use of RUE (currently 40% range)   Status Partially Met  pt with approxiametly 60% of range           OT Long Term Goals - 01/06/15 1203    OT LONG TERM GOAL #1   Title Pt  and wife will be I with upgraded HEP - 01/20/2015   Status On-going   OT LONG TERM GOAL #2   Title Pt will demonstrate improved coordination as evidenced by decreasing 9 hole peg test time by 8 seconds   Status On-going   OT LONG  TERM GOAL #3   Title Pt will be able to hold and move a dinner plate with his RUE.   Status On-going               Plan - 01/06/15 1157    Clinical Impression Statement Pt with slow progress toward goals;  needs encouragement to use RUE to full potential.   Rehab Potential Good   Clinical Impairments Affecting Rehab Potential Pt can benefit from skilled OT to address the following:  decreased ROM, decreased coordination (ataxia), altered tone, impaired functional use of RUE, pt/family education.   OT Frequency 2x / week   OT Duration 8 weeks   OT Treatment/Interventions Neuromuscular education;Therapeutic exercise;Therapeutic activities;Patient/family education;Manual Therapy;Therapeutic exercises;DME and/or AE instruction;Self-care/ADL training   Plan cont with coordination, functional use        Problem List Patient Active Problem List   Diagnosis Date Noted  . Aftercare following surgery of the circulatory system 10/25/2014  .  PVD (peripheral vascular disease) 10/25/2014  . Occlusion and stenosis of carotid artery without mention of cerebral infarction 11/16/2013  . Aftercare following surgery of the circulatory system, [place] 10/19/2013  . Peripheral vascular disease, unspecified 10/16/2012  . Venous insufficiency 02/19/2012  . CVA, old, hemiparesis 02/18/2012  . CAD (coronary artery disease) of artery bypass graft 02/18/2012  . Cellulitis 02/17/2012  . HTN (hypertension) 02/17/2012  . 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 [place], [place], [address] Phone: [phone]   Fax:  [phone]

## 2015-01-07 ENCOUNTER — Encounter: Payer: Self-pay | Admitting: Physical Therapy

## 2015-01-07 NOTE — Therapy (Signed)
[facility] [address] [place], [place], [address] Phone: [phone]   Fax:  [phone]  Physical Therapy Treatment  Patient Details  Name: [name] MRN: [id number] Date of Birth: December 13, 1946 Referring Provider:  Wenda Low, MD  Encounter Date: 01/06/2015      PT End of Session - 01/07/15 1058    Visit Number 21   Number of Visits 28   Date for PT Re-Evaluation 02/03/15   Authorization Type Medicare   Authorization Time Period 01-03-15 - 03-04-15   PT Start Time 1046   PT Stop Time 1135   PT Time Calculation (min) 49 min      Past Medical History  Diagnosis Date  . Coronary artery disease   . Diabetes mellitus   . Stroke feb 2012    complication of cath  . Myocardial infarction 2011  . Gout   . Peripheral vascular disease   . Arthritis     Gout    Past Surgical History  Procedure Laterality Date  . Coronary artery bypass graft    . Femoral bypass Left Sept. 30, 2011    Left Fem to above knee Pop Artery BPG  . Cardiac catheterization      stent done feb 2012  . Vascular surgery      There were no vitals taken for this visit.  Visit Diagnosis:  Hemiplegia affecting right dominant side  Abnormality of gait      Subjective Assessment - 01/07/15 1057    Symptoms Pt. reports no problems, no falls; cont to do exercises at home   Currently in Pain? No/denies     TherEx:  Pt. Performed RLE strengthening exercises including bridging, bridging with hip abdct/adduction, with marching and with LLE  Extension x 10 reps each;  Right knee flexion prone x 10 reps; right hip extension control off side of mat x 10 reps; R  Hip abduction In sidelying x 10 reps and clam shell RLE in left sidelying x 10 reps; R trunk rotaiton stretch x 30 sec hold x 1  Gait; with rollator - 100' x 1 with cues for step length and to stay close to RW; pt. Has decr. Eccentric control in stance and decr.   Dorsiflexion in swing due to his current AFO being broken and not sufficiently supporting R foot - pt. Needs a new AFO for RLE- one which is  More durable and more sturdy/supportive to minimize gait deviations and incr. safety                         PT Short Term Goals - 11/25/14 1752    PT SHORT TERM GOAL #1   Title Return demo HEP with written cues and wife's assistance   Baseline met 11-25-14   Status Achieved   PT SHORT TERM GOAL #2   Title Improve Berg balance test score to >/= 28/56 to reduce fall risk   Baseline met 10-28-14   Status Achieved   PT SHORT TERM GOAL #3   Title  Incr. gait speed by 0.5 ft/sec with LRAD   Baseline velocity 1.12 ft/sec   Status Not Met   PT SHORT TERM GOAL #4   Title Negotiate 4 steps x 3 reps iwth 1 handrail and S   Baseline met 11-25-14   Status Achieved           PT Long Term Goals - 01/04/15 2151    PT LONG  TERM GOAL #1   Title Amb. 250' with RW with SBA   Baseline met 01-04-15   Status Achieved   PT LONG TERM GOAL #2   Title Incr. gait velocity to >/= 1.4 ft/sec with RW   Baseline 1.06 ft/sec on 01-04-15   Status Not Met   PT LONG TERM GOAL #3   Title Independent in updated HEP   Status Achieved   PT LONG TERM GOAL #4   Title Amb. 350' with new AFO on RLE with RW with S.   Baseline target date 02-04-15   Time 4   Period Weeks   Status New   PT LONG TERM GOAL #5   Title Increase gait velocity to >/= 1.4 ft/sec with RW   Baseline target date 02-04-15   Time 4   Period Weeks   Status On-going   Additional Long Term Goals   Additional Long Term Goals Yes   PT LONG TERM GOAL #6   Title Negotiate steps with 2 rails with pt. reporting incr. R hip flexion during ascension with step negotiation at home   Baseline target 02-04-15   Time 4   Period Weeks   Status New               Plan - 01/07/15 1058    Clinical Impression Statement Pt. continues to wear PLS on RLE but due to toppart of brace being  broken, pt. continues to have decr. foot clearance in swing phase of gait and some incr. supination due to incr. tone; pt. would benefit from a more supportive and durable AFO to increaes sfaty iwth gait and minimize gait deviaitons   Pt will benefit from skilled therapeutic intervention in order to improve on the following deficits Abnormal gait;Decreased endurance;Decreased coordination;Decreased strength;Decreased balance   Rehab Potential Good   PT Frequency 2x / week   PT Duration 4 weeks   PT Treatment/Interventions Gait training;Therapeutic exercise;Patient/family education;Balance training;Stair training;Functional mobility training;Neuromuscular re-education;Therapeutic activities   PT Next Visit Plan try various AFO to determine if different type would be more effective and beneficial than one he currently has (which is broken);  request for script for new R  AFO faxed to Dr. Lysle Rubens   PT Home Exercise Plan ambulate as much as tolerated   Consulted and Agree with Plan of Care Patient;Family member/caregiver   Family Member Consulted spouse   PT Plan continue with exercises to strengthen and stretch RLE; standing balance as tolerated (limited by back pain)        Problem List Patient Active Problem List   Diagnosis Date Noted  . Aftercare following surgery of the circulatory system 10/25/2014  . PVD (peripheral vascular disease) 10/25/2014  . Occlusion and stenosis of carotid artery without mention of cerebral infarction 11/16/2013  . Aftercare following surgery of the circulatory system, [place] 10/19/2013  . Peripheral vascular disease, unspecified 10/16/2012  . Venous insufficiency 02/19/2012  . CVA, old, hemiparesis 02/18/2012  . CAD (coronary artery disease) of artery bypass graft 02/18/2012  . Cellulitis 02/17/2012  . HTN (hypertension) 02/17/2012  . 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 [place], [place], [address] Phone: [phone]   Fax:  [phone]

## 2015-01-11 ENCOUNTER — Encounter: Payer: Self-pay | Admitting: Occupational Therapy

## 2015-01-11 ENCOUNTER — Ambulatory Visit: Payer: Medicare Other | Admitting: Occupational Therapy

## 2015-01-11 DIAGNOSIS — R269 Unspecified abnormalities of gait and mobility: Secondary | ICD-10-CM | POA: Diagnosis not present

## 2015-01-11 DIAGNOSIS — G8191 Hemiplegia, unspecified affecting right dominant side: Secondary | ICD-10-CM

## 2015-01-11 DIAGNOSIS — Z951 Presence of aortocoronary bypass graft: Secondary | ICD-10-CM | POA: Diagnosis not present

## 2015-01-11 DIAGNOSIS — M6281 Muscle weakness (generalized): Secondary | ICD-10-CM | POA: Diagnosis not present

## 2015-01-11 DIAGNOSIS — Z5189 Encounter for other specified aftercare: Secondary | ICD-10-CM | POA: Diagnosis not present

## 2015-01-11 DIAGNOSIS — IMO0002 Reserved for concepts with insufficient information to code with codable children: Secondary | ICD-10-CM

## 2015-01-11 DIAGNOSIS — I69354 Hemiplegia and hemiparesis following cerebral infarction affecting left non-dominant side: Secondary | ICD-10-CM | POA: Diagnosis not present

## 2015-01-11 DIAGNOSIS — I252 Old myocardial infarction: Secondary | ICD-10-CM | POA: Diagnosis not present

## 2015-01-11 NOTE — Therapy (Signed)
[facility] [address] [place], [place], [address] Phone: [phone]   Fax:  [phone]  Occupational Therapy Treatment  Patient Details  Name: [name] MRN: [id number] Date of Birth: November 03, 1946 Referring Provider:  Wenda Low, MD  Encounter Date: 01/11/2015      OT End of Session - 01/11/15 1227    Visit Number 10   Number of Visits 16   Date for OT Re-Evaluation 01/20/15   Authorization Type MCR   OT Start Time 1018   OT Stop Time 1100   OT Time Calculation (min) 42 min   Activity Tolerance Patient tolerated treatment well      Past Medical History  Diagnosis Date  . Coronary artery disease   . Diabetes mellitus   . Stroke feb 2012    complication of cath  . Myocardial infarction 2011  . Gout   . Peripheral vascular disease   . Arthritis     Gout    Past Surgical History  Procedure Laterality Date  . Coronary artery bypass graft    . Femoral bypass Left Sept. 30, 2011    Left Fem to above knee Pop Artery BPG  . Cardiac catheterization      stent done feb 2012  . Vascular surgery      There were no vitals taken for this visit.  Visit Diagnosis:  Lack of coordination due to stroke  Hemiplegia affecting right dominant side      Subjective Assessment - 01/11/15 1023    Symptoms (p) I am doing my exercises   Pertinent History (p) see epic snapshot   Currently in Pain? (p) No/denies                 OT Treatments/Exercises (OP) - 01/11/15 0001    Fine Motor Coordination   Fine Motor Coordination --  therapeutic activities to address fine motor with reach   Hand Exercises   Other Hand Exercises gripper with 35 pounds of resistance incorporated into mid level functional reach activities and sustained hand and arm activity   Functional Reaching Activities   Mid Level Therapeutic activities to address mid level reach, sustained RUE activity, improve normal movement  pattern (pt needs min vc's) and gross coordination                  OT Short Term Goals - 01/11/15 1230    OT SHORT TERM GOAL #1   Title Pt and wife will be I with HEP - 12/23/2014   Status Partially Met  needed cues for one exercise   OT SHORT TERM GOAL #2   Title Pt will demonstrate improved fine motor control as evidenced by being able to manipulate moderately small objects within a functional task.   Status Achieved   OT SHORT TERM GOAL #3   Title Pt will demonstrate improved ROM to 75% for supination in prep for improved functional use of RUE (currently 40% range)   Status Partially Met  pt with approxiametly 60% of range           OT Long Term Goals - 01/11/15 1230    OT LONG TERM GOAL #1   Title Pt  and wife will be I with upgraded HEP - 01/20/2015   Status On-going   OT LONG TERM GOAL #2   Title Pt will demonstrate improved coordination as evidenced by decreasing 9 hole peg test time by 8 seconds   Status On-going   OT  LONG TERM GOAL #3   Title Pt will be able to hold and move a dinner plate with his RUE.   Status On-going               Plan - 13-Jan-2015 1229    Clinical Impression Statement Pt with slow progress toward LTG's. Pt reports he is doing HEP at this time.  Continues to need encouragement to use RUE to full potential   Rehab Potential Good   Clinical Impairments Affecting Rehab Potential Pt can benefit from skilled OT to address the following:  decreased ROM, decreased coordination (ataxia), altered tone, impaired functional use of RUE, pt/family education.   OT Frequency 2x / week   OT Duration 8 weeks   OT Treatment/Interventions Neuromuscular education;Therapeutic exercise;Therapeutic activities;Patient/family education;Manual Therapy;Therapeutic exercises;DME and/or AE instruction;Self-care/ADL training   Plan coordination, grip strength, functional use. Check supination ROM   Consulted and Agree with Plan of Care Patient           G-Codes - 2015-01-13 1231    Carrying, Moving and Handling Objects Current Status [phone]) At least 57 percent but less than 80 percent impaired, limited or restricted   Carrying, Moving and Handling Objects Goal Status ([id number]) At least 40 percent but less than 60 percent impaired, limited or restricted      Problem List Patient Active Problem List   Diagnosis Date Noted  . Aftercare following surgery of the circulatory system 10/25/2014  . PVD (peripheral vascular disease) 10/25/2014  . Occlusion and stenosis of carotid artery without mention of cerebral infarction 11/16/2013  . Aftercare following surgery of the circulatory system, [place] 10/19/2013  . Peripheral vascular disease, unspecified 10/16/2012  . Venous insufficiency 02/19/2012  . CVA, old, hemiparesis 02/18/2012  . CAD (coronary artery disease) of artery bypass graft 02/18/2012  . Cellulitis 02/17/2012  . HTN (hypertension) 02/17/2012  . 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 [place], [place], [address] Phone: [phone]   Fax:  [phone]

## 2015-01-13 ENCOUNTER — Ambulatory Visit: Payer: Medicare Other | Admitting: Occupational Therapy

## 2015-01-14 ENCOUNTER — Encounter: Payer: Medicare Other | Admitting: Occupational Therapy

## 2015-01-17 ENCOUNTER — Ambulatory Visit: Payer: Medicare Other | Admitting: Physical Therapy

## 2015-01-19 ENCOUNTER — Ambulatory Visit: Payer: Medicare Other | Admitting: Occupational Therapy

## 2015-01-19 ENCOUNTER — Encounter: Payer: Self-pay | Admitting: Occupational Therapy

## 2015-01-19 DIAGNOSIS — Z5189 Encounter for other specified aftercare: Secondary | ICD-10-CM | POA: Diagnosis not present

## 2015-01-19 DIAGNOSIS — F4320 Adjustment disorder, unspecified: Secondary | ICD-10-CM | POA: Diagnosis not present

## 2015-01-19 DIAGNOSIS — G8191 Hemiplegia, unspecified affecting right dominant side: Secondary | ICD-10-CM

## 2015-01-19 DIAGNOSIS — I69354 Hemiplegia and hemiparesis following cerebral infarction affecting left non-dominant side: Secondary | ICD-10-CM | POA: Diagnosis not present

## 2015-01-19 DIAGNOSIS — Z951 Presence of aortocoronary bypass graft: Secondary | ICD-10-CM | POA: Diagnosis not present

## 2015-01-19 DIAGNOSIS — M6281 Muscle weakness (generalized): Secondary | ICD-10-CM | POA: Diagnosis not present

## 2015-01-19 DIAGNOSIS — I252 Old myocardial infarction: Secondary | ICD-10-CM | POA: Diagnosis not present

## 2015-01-19 DIAGNOSIS — R269 Unspecified abnormalities of gait and mobility: Secondary | ICD-10-CM | POA: Diagnosis not present

## 2015-01-19 DIAGNOSIS — R270 Ataxia, unspecified: Secondary | ICD-10-CM

## 2015-01-19 DIAGNOSIS — IMO0002 Reserved for concepts with insufficient information to code with codable children: Secondary | ICD-10-CM

## 2015-01-19 NOTE — Therapy (Signed)
[facility] [address] [place], [place], [address] Phone: [phone]   Fax:  [phone]  Occupational Therapy Treatment  Patient Details  Name: [name] MRN: [id number] Date of Birth: Aug 15, 1946 Referring Provider:  Wenda Low, MD  Encounter Date: 01/19/2015      OT End of Session - 01/19/15 1033    Visit Number 11   Number of Visits 16   Date for OT Re-Evaluation 01/20/15   OT Start Time 0930   OT Stop Time 1014   OT Time Calculation (min) 44 min   Activity Tolerance Patient tolerated treatment well      Past Medical History  Diagnosis Date  . Coronary artery disease   . Diabetes mellitus   . Stroke feb 2012    complication of cath  . Myocardial infarction 2011  . Gout   . Peripheral vascular disease   . Arthritis     Gout    Past Surgical History  Procedure Laterality Date  . Coronary artery bypass graft    . Femoral bypass Left Sept. 30, 2011    Left Fem to above knee Pop Artery BPG  . Cardiac catheterization      stent done feb 2012  . Vascular surgery      There were no vitals taken for this visit.  Visit Diagnosis:  Lack of coordination due to stroke  Hemiplegia affecting right dominant side  Ataxia      Subjective Assessment - 01/19/15 0936    Symptoms I am still working with PT   Pertinent History see epic snapshot   Currently in Pain? No/denies                 OT Treatments/Exercises (OP) - 01/19/15 0001    Exercises   Exercises Shoulder   Shoulder Exercises: Supine   Other Supine Exercises Reviewed home exercise program with pt in anticipation of discharge. Issued next level of theraputty as well as provided written instruction on how to upgrade home program as pt progresses at home.  Pt does not appear to be doing HEP consistently as he was not overly familiar with exercises as we reviewed. Again stressed importance of consistent completion of HEP if  pt wishes to continue to improve the use of his RUE. Pt verbalize understanding.   Fine Motor Coordination   In Hand Manipulation Training Rechecked 9 hole peg - pt continues to be at baseline however functionally pt has improved.  Today worked on picking up very small objects from various containers (small to very small). Pt also able to manipulate smalls screws in hand without difficulty.  Pt states he is able to do more at home in terms of coordination.   Functional Reaching Activities   Mid Level Pt able to hold and reach with right hand to hold dinner plate with objects on it and reach in multiple directions up to 100 degrees of shoulder flexion while still maintaining control of items on plate.  Pt also demonstrates enough functional supination to hold dinner plate level while reaching.                OT Education - 01/19/15 1033    Education provided Yes   Education Details how to upgrade HEP    Person(s) Educated Patient;Spouse   Methods Explanation;Demonstration;Verbal cues;Handout   Comprehension Verbalized understanding;Returned demonstration          OT Short Term Goals - 01/19/15 1034    OT SHORT  TERM GOAL #1   Title Pt and wife will be I with HEP - 12/23/2014   Status Partially Met  needed cues for one exercise   OT SHORT TERM GOAL #2   Title Pt will demonstrate improved fine motor control as evidenced by being able to manipulate moderately small objects within a functional task.   Status Achieved   OT SHORT TERM GOAL #3   Title Pt will demonstrate improved ROM to 75% for supination in prep for improved functional use of RUE (currently 40% range)   Status Partially Met  pt with approxiametly 60% of range           OT Long Term Goals - 01/19/15 1034    OT LONG TERM GOAL #1   Title Pt  and wife will be I with upgraded HEP - 01/20/2015   Status Achieved   OT LONG TERM GOAL #2   Title Pt will demonstrate improved coordination as evidenced by decreasing 9  hole peg test time by 8 seconds   Status Not Met  however pt with significant functional improvement see OT note   OT LONG TERM GOAL #3   Title Pt will be able to hold and move a dinner plate with his RUE.   Status Achieved               Plan - 01/19/15 1033    Clinical Impression Statement Pt has met most of his STG's and LTG's and is now ready for d/c from OT services.  Pt given ways to increase challenge of HEP on his own.   Rehab Potential Good   Clinical Impairments Affecting Rehab Potential Pt can benefit from skilled OT to address the following:  decreased ROM, decreased coordination (ataxia), altered tone, impaired functional use of RUE, pt/family education.   OT Frequency 2x / week   OT Duration 8 weeks   OT Treatment/Interventions Neuromuscular education;Therapeutic exercise;Therapeutic activities;Patient/family education;Manual Therapy;Therapeutic exercises;DME and/or AE instruction;Self-care/ADL training   Plan d/c from OT today        Problem List Patient Active Problem List   Diagnosis Date Noted  . Aftercare following surgery of the circulatory system 10/25/2014  . PVD (peripheral vascular disease) 10/25/2014  . Occlusion and stenosis of carotid artery without mention of cerebral infarction 11/16/2013  . Aftercare following surgery of the circulatory system, [place] 10/19/2013  . Peripheral vascular disease, unspecified 10/16/2012  . Venous insufficiency 02/19/2012  . CVA, old, hemiparesis 02/18/2012  . CAD (coronary artery disease) of artery bypass graft 02/18/2012  . Cellulitis 02/17/2012  . HTN (hypertension) 02/17/2012  . 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  Patient goals were partially met. Patient is being discharged due to meeting the stated rehab goals.  ?????     Quay Burow, OTR/L 01/19/2015, 10:37 AM  [facility] [address] [place], [place], [address] Phone: [phone]   Fax:  [phone]

## 2015-01-19 NOTE — Patient Instructions (Signed)
Ways to increase the challenge of your Home Program for your Arms: Always want to respect any pain you get especially in your shoulder.    1. Increase the number of repetitions 2. Increase the number of sessions per day 3. Increase the weight - go slowly!! 4. Increase your theraputty to the next color (transition from Pink to [place]). Can do half the program with one color and half with the other color, alternate days with goal being able to do the whole program with green putty. 5. Increase the time you "hold" the position longer  Only increase one of these on any given day - avoid over taxing.

## 2015-01-21 ENCOUNTER — Ambulatory Visit: Payer: Medicare Other | Admitting: Occupational Therapy

## 2015-01-25 ENCOUNTER — Ambulatory Visit: Payer: Medicare Other | Attending: Physical Medicine & Rehabilitation | Admitting: Physical Therapy

## 2015-01-25 ENCOUNTER — Encounter: Payer: Medicare Other | Admitting: Occupational Therapy

## 2015-01-25 DIAGNOSIS — I252 Old myocardial infarction: Secondary | ICD-10-CM | POA: Diagnosis not present

## 2015-01-25 DIAGNOSIS — R269 Unspecified abnormalities of gait and mobility: Secondary | ICD-10-CM | POA: Diagnosis not present

## 2015-01-25 DIAGNOSIS — Z951 Presence of aortocoronary bypass graft: Secondary | ICD-10-CM | POA: Diagnosis not present

## 2015-01-25 DIAGNOSIS — G8191 Hemiplegia, unspecified affecting right dominant side: Secondary | ICD-10-CM

## 2015-01-25 DIAGNOSIS — I69354 Hemiplegia and hemiparesis following cerebral infarction affecting left non-dominant side: Secondary | ICD-10-CM | POA: Diagnosis not present

## 2015-01-25 DIAGNOSIS — Z5189 Encounter for other specified aftercare: Secondary | ICD-10-CM | POA: Diagnosis not present

## 2015-01-25 DIAGNOSIS — M6281 Muscle weakness (generalized): Secondary | ICD-10-CM | POA: Insufficient documentation

## 2015-01-26 ENCOUNTER — Encounter: Payer: Self-pay | Admitting: Physical Therapy

## 2015-01-26 NOTE — Therapy (Signed)
[facility] [address] [place], [place], [address] Phone: [phone]   Fax:  [phone]  Physical Therapy Treatment  Patient Details  Name: [name] MRN: [id number] Date of Birth: 1946/08/18 Referring Provider:  Wenda Low, MD  Encounter Date: 01/25/2015      PT End of Session - 01/26/15 0954    Visit Number 22   Number of Visits 28   Date for PT Re-Evaluation 02/03/15   Authorization Type Medicare   Authorization Time Period 01-03-15 - 03-04-15   PT Start Time 1100   PT Stop Time 1149   PT Time Calculation (min) 49 min      Past Medical History  Diagnosis Date  . Coronary artery disease   . Diabetes mellitus   . Stroke feb 2012    complication of cath  . Myocardial infarction 2011  . Gout   . Peripheral vascular disease   . Arthritis     Gout    Past Surgical History  Procedure Laterality Date  . Coronary artery bypass graft    . Femoral bypass Left Sept. 30, 2011    Left Fem to above knee Pop Artery BPG  . Cardiac catheterization      stent done feb 2012  . Vascular surgery      There were no vitals taken for this visit.  Visit Diagnosis:  Hemiplegia affecting right dominant side  Abnormality of gait      Subjective Assessment - 01/26/15 0953    Symptoms Pt. reports R leg continues to be tight but states he is doing stretches at home; pt. inquires about script for new AFO for RLE   Currently in Pain? No/denies      TherEx:  Bridging x 5 reps; briding with marching, with hip abdct./addct. And with LLE extension x 10 reps; SLR with 2# Weight x 10 reps;  R knee to chest x 10 reps; R hip extension control off mat with 2#  X 10 reps;  R hip abduct. X 10 reps in left sidelying  R hip abduct. With external rotation with 5# x 10 reps; stretches - right trunk rotation and right hamstring stretch passively 60 sec hold; quadriped - sitting back on heels, then shifting to right heel  with hand toward left side for right trunk stretch R heel cord stretch in standing with 2" step 30 sec hold x 2 reps - added to HEP Leg Press seat 15 110# 20 reps bil. LE's; RLE only 60# 20 reps                        PT Short Term Goals - 11/25/14 1752    PT SHORT TERM GOAL #1   Title Return demo HEP with written cues and wife's assistance   Baseline met 11-25-14   Status Achieved   PT SHORT TERM GOAL #2   Title Improve Berg balance test score to >/= 28/56 to reduce fall risk   Baseline met 10-28-14   Status Achieved   PT SHORT TERM GOAL #3   Title  Incr. gait speed by 0.5 ft/sec with LRAD   Baseline velocity 1.12 ft/sec   Status Not Met   PT SHORT TERM GOAL #4   Title Negotiate 4 steps x 3 reps iwth 1 handrail and S   Baseline met 11-25-14   Status Achieved           PT Long Term Goals - 01/04/15  2151    PT LONG TERM GOAL #1   Title Amb. 250' with RW with SBA   Baseline met 01-04-15   Status Achieved   PT LONG TERM GOAL #2   Title Incr. gait velocity to >/= 1.4 ft/sec with RW   Baseline 1.06 ft/sec on 01-04-15   Status Not Met   PT LONG TERM GOAL #3   Title Independent in updated HEP   Status Achieved   PT LONG TERM GOAL #4   Title Amb. 350' with new AFO on RLE with RW with S.   Baseline target date 02-04-15   Time 4   Period Weeks   Status New   PT LONG TERM GOAL #5   Title Increase gait velocity to >/= 1.4 ft/sec with RW   Baseline target date 02-04-15   Time 4   Period Weeks   Status On-going   Additional Long Term Goals   Additional Long Term Goals Yes   PT LONG TERM GOAL #6   Title Negotiate steps with 2 rails with pt. reporting incr. R hip flexion during ascension with step negotiation at home   Baseline target 02-04-15   Time 4   Period Weeks   Status New               Plan - 01/26/15 2633    Clinical Impression Statement Pt. needs new AFO for RLE - script given to pt.'s wife to make appt. with [facility]; pt. continues to have  tightness in R trunk and in RLE   Pt will benefit from skilled therapeutic intervention in order to improve on the following deficits Abnormal gait;Decreased endurance;Decreased coordination;Decreased strength;Decreased balance   Rehab Potential Good   PT Frequency 2x / week   PT Duration 4 weeks   PT Treatment/Interventions Gait training;Therapeutic exercise;Patient/family education;Balance training;Stair training;Functional mobility training;Neuromuscular re-education;Therapeutic activities   PT Next Visit Plan try AFO's; cont ther ex   PT Home Exercise Plan ambulate as much as tolerated   Consulted and Agree with Plan of Care Patient;Family member/caregiver        Problem List Patient Active Problem List   Diagnosis Date Noted  . Aftercare following surgery of the circulatory system 10/25/2014  . PVD (peripheral vascular disease) 10/25/2014  . Occlusion and stenosis of carotid artery without mention of cerebral infarction 11/16/2013  . Aftercare following surgery of the circulatory system, [place] 10/19/2013  . Peripheral vascular disease, unspecified 10/16/2012  . Venous insufficiency 02/19/2012  . CVA, old, hemiparesis 02/18/2012  . CAD (coronary artery disease) of artery bypass graft 02/18/2012  . Cellulitis 02/17/2012  . HTN (hypertension) 02/17/2012  . 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 [place], [place], [address] Phone: [phone]   Fax:  [phone]

## 2015-01-27 ENCOUNTER — Ambulatory Visit: Payer: Medicare Other | Admitting: Occupational Therapy

## 2015-01-27 ENCOUNTER — Ambulatory Visit: Payer: Medicare Other | Admitting: Physical Therapy

## 2015-01-27 DIAGNOSIS — M6281 Muscle weakness (generalized): Secondary | ICD-10-CM | POA: Diagnosis not present

## 2015-01-27 DIAGNOSIS — R269 Unspecified abnormalities of gait and mobility: Secondary | ICD-10-CM | POA: Diagnosis not present

## 2015-01-27 DIAGNOSIS — I252 Old myocardial infarction: Secondary | ICD-10-CM | POA: Diagnosis not present

## 2015-01-27 DIAGNOSIS — Z951 Presence of aortocoronary bypass graft: Secondary | ICD-10-CM | POA: Diagnosis not present

## 2015-01-27 DIAGNOSIS — G8191 Hemiplegia, unspecified affecting right dominant side: Secondary | ICD-10-CM

## 2015-01-27 DIAGNOSIS — Z5189 Encounter for other specified aftercare: Secondary | ICD-10-CM | POA: Diagnosis not present

## 2015-01-27 DIAGNOSIS — I69354 Hemiplegia and hemiparesis following cerebral infarction affecting left non-dominant side: Secondary | ICD-10-CM | POA: Diagnosis not present

## 2015-01-28 ENCOUNTER — Encounter: Payer: Self-pay | Admitting: Physical Therapy

## 2015-01-28 NOTE — Therapy (Signed)
[facility] [address] [place], [place], [address] Phone: [phone]   Fax:  [phone]  Physical Therapy Treatment  Patient Details  Name: [name] MRN: [id number] Date of Birth: 03/21/1946 Referring Provider:  Wenda Low, MD  Encounter Date: 01/27/2015      PT End of Session - 01/28/15 1138    Visit Number 23   Number of Visits 28   Date for PT Re-Evaluation 02/03/15   Authorization Type Medicare   Authorization Time Period 01-03-15 - 03-04-15   PT Start Time 1105   PT Stop Time 1156   PT Time Calculation (min) 51 min      Past Medical History  Diagnosis Date  . Coronary artery disease   . Diabetes mellitus   . Stroke feb 2012    complication of cath  . Myocardial infarction 2011  . Gout   . Peripheral vascular disease   . Arthritis     Gout    Past Surgical History  Procedure Laterality Date  . Coronary artery bypass graft    . Femoral bypass Left Sept. 30, 2011    Left Fem to above knee Pop Artery BPG  . Cardiac catheterization      stent done feb 2012  . Vascular surgery      There were no vitals taken for this visit.  Visit Diagnosis:  Hemiplegia affecting right dominant side  Abnormality of gait      Subjective Assessment - 01/28/15 1137    Symptoms Pt. states Chris, orthotist, recommended different type of AFO - took script and had consult this week; will take about 2-3 weeks to obtain   Currently in Pain? No/denies     TherEx:  RLE strengthening and stretching exercises;  Leg press bil. LE's 115# x 10 reps:  RLE only 65# x 10 reps;  seat at 15:  Bridging x 10 reps; bridging with LLE extension, hip abduction and marching x 10 reps each; R hip extension Control ex. Off side of mat x 10 reps with 3# weight; R hip abdct/with ER 5# x 10 reps; R trunk rotation x 30 secs hold R SLR 10 reps; R knee to chest with extension x 10 reps;  R hamstring stretch passively 30 secs x 1  reps; with contract/ Relax x 10 reps; R prone hamstring strengthening x 10 reps with min assist for eccentric control   R hip extension with R knee flexed at 90 degrees (pt in prone position) x 10 reps with mod assist                        PT Short Term Goals - 11/25/14 1752    PT SHORT TERM GOAL #1   Title Return demo HEP with written cues and wife's assistance   Baseline met 11-25-14   Status Achieved   PT SHORT TERM GOAL #2   Title Improve Berg balance test score to >/= 28/56 to reduce fall risk   Baseline met 10-28-14   Status Achieved   PT SHORT TERM GOAL #3   Title  Incr. gait speed by 0.5 ft/sec with LRAD   Baseline velocity 1.12 ft/sec   Status Not Met   PT SHORT TERM GOAL #4   Title Negotiate 4 steps x 3 reps iwth 1 handrail and S   Baseline met 11-25-14   Status Achieved           PT Long Term Goals -  01/04/15 2151    PT LONG TERM GOAL #1   Title Amb. 250' with RW with SBA   Baseline met 01-04-15   Status Achieved   PT LONG TERM GOAL #2   Title Incr. gait velocity to >/= 1.4 ft/sec with RW   Baseline 1.06 ft/sec on 01-04-15   Status Not Met   PT LONG TERM GOAL #3   Title Independent in updated HEP   Status Achieved   PT LONG TERM GOAL #4   Title Amb. 350' with new AFO on RLE with RW with S.   Baseline target date 02-04-15   Time 4   Period Weeks   Status New   PT LONG TERM GOAL #5   Title Increase gait velocity to >/= 1.4 ft/sec with RW   Baseline target date 02-04-15   Time 4   Period Weeks   Status On-going   Additional Long Term Goals   Additional Long Term Goals Yes   PT LONG TERM GOAL #6   Title Negotiate steps with 2 rails with pt. reporting incr. R hip flexion during ascension with step negotiation at home   Baseline target 02-04-15   Time 4   Period Weeks   Status New               Plan - 01/28/15 1139    Clinical Impression Statement Pt. to obtain new AFO - should assist to decrease gait deviations and allow  increased R foot clearance in swing   Pt will benefit from skilled therapeutic intervention in order to improve on the following deficits Abnormal gait;Decreased endurance;Decreased coordination;Decreased strength;Decreased balance   Rehab Potential Good   PT Frequency 2x / week   PT Duration 4 weeks   PT Treatment/Interventions Gait training;Therapeutic exercise;Patient/family education;Balance training;Stair training;Functional mobility training;Neuromuscular re-education;Therapeutic activities   PT Next Visit Plan cont ther ex   PT Home Exercise Plan ambulate as much as tolerated   Consulted and Agree with Plan of Care Patient;Family member/caregiver   Family Member Consulted spouse   PT Plan continue with exercises to strengthen and stretch RLE; standing balance as tolerated (limited by back pain)        Problem List Patient Active Problem List   Diagnosis Date Noted  . Aftercare following surgery of the circulatory system 10/25/2014  . PVD (peripheral vascular disease) 10/25/2014  . Occlusion and stenosis of carotid artery without mention of cerebral infarction 11/16/2013  . Aftercare following surgery of the circulatory system, [place] 10/19/2013  . Peripheral vascular disease, unspecified 10/16/2012  . Venous insufficiency 02/19/2012  . CVA, old, hemiparesis 02/18/2012  . CAD (coronary artery disease) of artery bypass graft 02/18/2012  . Cellulitis 02/17/2012  . HTN (hypertension) 02/17/2012  . 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

## 2015-02-01 ENCOUNTER — Ambulatory Visit: Payer: Medicare Other | Admitting: Physical Therapy

## 2015-02-01 DIAGNOSIS — M6281 Muscle weakness (generalized): Secondary | ICD-10-CM | POA: Diagnosis not present

## 2015-02-01 DIAGNOSIS — I252 Old myocardial infarction: Secondary | ICD-10-CM | POA: Diagnosis not present

## 2015-02-01 DIAGNOSIS — Z951 Presence of aortocoronary bypass graft: Secondary | ICD-10-CM | POA: Diagnosis not present

## 2015-02-01 DIAGNOSIS — I69354 Hemiplegia and hemiparesis following cerebral infarction affecting left non-dominant side: Secondary | ICD-10-CM | POA: Diagnosis not present

## 2015-02-01 DIAGNOSIS — R269 Unspecified abnormalities of gait and mobility: Secondary | ICD-10-CM | POA: Diagnosis not present

## 2015-02-01 DIAGNOSIS — Z5189 Encounter for other specified aftercare: Secondary | ICD-10-CM | POA: Diagnosis not present

## 2015-02-01 DIAGNOSIS — G8191 Hemiplegia, unspecified affecting right dominant side: Secondary | ICD-10-CM

## 2015-02-02 ENCOUNTER — Encounter: Payer: Self-pay | Admitting: Physical Therapy

## 2015-02-02 NOTE — Therapy (Signed)
[facility] [address] [place], [place], [address] Phone: [phone]   Fax:  [phone]  Physical Therapy Treatment  Patient Details  Name: [name] MRN: [id number] Date of Birth: [birth year] Referring Provider:  Wenda Low, MD  Encounter Date: 02/01/2015      PT End of Session - 02/02/15 0926    Visit Number 24  G4   Number of Visits 28   Date for PT Re-Evaluation 02/03/15   Authorization Type Medicare   Authorization Time Period 01-03-15 - 03-04-15   PT Start Time 0935   PT Stop Time 1020   PT Time Calculation (min) 45 min   Equipment Utilized During Treatment Gait belt      Past Medical History  Diagnosis Date  . Coronary artery disease   . Diabetes mellitus   . Stroke feb 2012    complication of cath  . Myocardial infarction 2011  . Gout   . Peripheral vascular disease   . Arthritis     Gout    Past Surgical History  Procedure Laterality Date  . Coronary artery bypass graft    . Femoral bypass Left Sept. 30, 2011    Left Fem to above knee Pop Artery BPG  . Cardiac catheterization      stent done feb 2012  . Vascular surgery      There were no vitals taken for this visit.  Visit Diagnosis:  Hemiplegia affecting right dominant side  Abnormality of gait      Subjective Assessment - 02/02/15 0921    Symptoms Pt. states he has been walking with 10# weight on leg at home for strengthening RLE   Currently in Pain? No/denies                    [facility] Adult PT Treatment/Exercise - 02/02/15 0001    Knee/Hip Exercises: Machines for Strengthening   Cybex Leg Press bil. LE's 120# 20 reps; RLE only 60# 20 reps   Knee/Hip Exercises: Standing   Forward Step Up 10 reps;Right   Knee/Hip Exercises: Supine   Bridges 10 reps   Other Supine Knee Exercises left hip flexion/extension control off mat 10 reps   Knee/Hip Exercises: Sidelying   Hip ABduction 10 reps   Clams 10 reps  RLE   Other Sidelying Knee Exercises R hip flexion/extension in abduction x 10 reps   Knee/Hip Exercises: Prone   Hamstring Curl 1 set;10 reps   Other Prone Exercises right hip extension with knee flexed x 10 reps with mod assist.     Gait; inside bars 10' x 4 reps  With LUE support with cues for step length and posture; step negotiation training with L hand rail with SBA; pt. Ascends with RLE and descends with LLE 1st TherEx: R heel slides with 2#weight x 10 reps; R SLR x 10 reps with 2# weight Stretching in quadriped position - leaning back on heels - hips shifted toward R heel with hands going toward L side  X 30 sec hold            PT Short Term Goals - 11/25/14 1752    PT SHORT TERM GOAL #1   Title Return demo HEP with written cues and wife's assistance   Baseline met 11-25-14   Status Achieved   PT SHORT TERM GOAL #2   Title Improve Berg balance test score to >/= 28/56 to reduce fall risk   Baseline met 10-28-14  Status Achieved   PT SHORT TERM GOAL #3   Title  Incr. gait speed by 0.5 ft/sec with LRAD   Baseline velocity 1.12 ft/sec   Status Not Met   PT SHORT TERM GOAL #4   Title Negotiate 4 steps x 3 reps iwth 1 handrail and S   Baseline met 11-25-14   Status Achieved           PT Long Term Goals - 02/02/15 0929    PT LONG TERM GOAL #2   Title Incr. gait velocity to >/= 1.4 ft/sec with RW   Baseline 1.06 ft/sec on 01-04-15   Time 3   Period Weeks   PT LONG TERM GOAL #4   Title Amb. 350' with new AFO on RLE with RW with S.   Baseline target date 02-04-15   Time 4   Period Weeks   PT LONG TERM GOAL #5   Title Increase gait velocity to >/= 1.4 ft/sec with RW   Baseline target date 02-04-15   Time 4   Period Weeks   Status On-going   PT LONG TERM GOAL #6   Title Negotiate steps with 2 rails with pt. reporting incr. R hip flexion during ascension with step negotiation at home   Baseline target 02-04-15   Time 4   Period Weeks   Status New                Plan - 02/02/15 4235    Clinical Impression Statement Pt. continues to have significant R hamstring weakness resulting in gait deviations - new AFO should improve gait pattern   Pt will benefit from skilled therapeutic intervention in order to improve on the following deficits Abnormal gait;Decreased endurance;Decreased coordination;Decreased strength;Decreased balance   Rehab Potential Good   PT Frequency 2x / week   PT Duration 4 weeks   PT Treatment/Interventions Gait training;Therapeutic exercise;Patient/family education;Balance training;Stair training;Functional mobility training;Neuromuscular re-education;Therapeutic activities   PT Next Visit Plan cont ther ex   PT Plan continue with exercises to strengthen and stretch RLE; standing balance as tolerated (limited by back pain)        Problem List Patient Active Problem List   Diagnosis Date Noted  . Aftercare following surgery of the circulatory system 10/25/2014  . PVD (peripheral vascular disease) 10/25/2014  . Occlusion and stenosis of carotid artery without mention of cerebral infarction 11/16/2013  . Aftercare following surgery of the circulatory system, [place] 10/19/2013  . Peripheral vascular disease, unspecified 10/16/2012  . Venous insufficiency 02/19/2012  . CVA, old, hemiparesis 02/18/2012  . CAD (coronary artery disease) of artery bypass graft 02/18/2012  . Cellulitis 02/17/2012  . HTN (hypertension) 02/17/2012  . 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 [place], [place], [address] Phone: [phone]   Fax:  [phone]

## 2015-02-03 ENCOUNTER — Ambulatory Visit: Payer: Medicare Other | Admitting: Physical Therapy

## 2015-02-03 DIAGNOSIS — Z951 Presence of aortocoronary bypass graft: Secondary | ICD-10-CM | POA: Diagnosis not present

## 2015-02-03 DIAGNOSIS — R269 Unspecified abnormalities of gait and mobility: Secondary | ICD-10-CM | POA: Diagnosis not present

## 2015-02-03 DIAGNOSIS — Z5189 Encounter for other specified aftercare: Secondary | ICD-10-CM | POA: Diagnosis not present

## 2015-02-03 DIAGNOSIS — I69354 Hemiplegia and hemiparesis following cerebral infarction affecting left non-dominant side: Secondary | ICD-10-CM | POA: Diagnosis not present

## 2015-02-03 DIAGNOSIS — F4320 Adjustment disorder, unspecified: Secondary | ICD-10-CM

## 2015-02-03 DIAGNOSIS — G8191 Hemiplegia, unspecified affecting right dominant side: Secondary | ICD-10-CM

## 2015-02-03 DIAGNOSIS — M6281 Muscle weakness (generalized): Secondary | ICD-10-CM | POA: Diagnosis not present

## 2015-02-03 DIAGNOSIS — I252 Old myocardial infarction: Secondary | ICD-10-CM | POA: Diagnosis not present

## 2015-02-04 ENCOUNTER — Encounter: Payer: Self-pay | Admitting: Physical Therapy

## 2015-02-04 NOTE — Therapy (Signed)
[facility] [address] [place], [place], [address] Phone: [phone]   Fax:  [phone]  Physical Therapy Treatment  Patient Details  Name: [name] MRN: [id number] Date of Birth: 02-Dec-1946 Referring Provider:  Wenda Low, MD  Encounter Date: 02/03/2015      PT End of Session - 02/04/15 0950    Visit Number 25  G5   Number of Visits 28   Date for PT Re-Evaluation 02/10/15   Authorization Type Medicare   Authorization Time Period 01-03-15 - 03-04-15   PT Start Time 0935   PT Stop Time 1019   PT Time Calculation (min) 44 min      Past Medical History  Diagnosis Date  . Coronary artery disease   . Diabetes mellitus   . Stroke feb 2012    complication of cath  . Myocardial infarction 2011  . Gout   . Peripheral vascular disease   . Arthritis     Gout    Past Surgical History  Procedure Laterality Date  . Coronary artery bypass graft    . Femoral bypass Left Sept. 30, 2011    Left Fem to above knee Pop Artery BPG  . Cardiac catheterization      stent done feb 2012  . Vascular surgery      There were no vitals taken for this visit.  Visit Diagnosis:  Hemiplegia affecting right dominant side  Abnormality of gait      Subjective Assessment - 02/04/15 0945    Symptoms Pt. reports no changes and no problems                    [facility] Adult PT Treatment/Exercise - 02/04/15 0001    Ambulation/Gait   Ambulation/Gait Yes   Ambulation/Gait Assistance 5: Supervision   Ambulation Distance (Feet) 200 Feet   Assistive device Rolling walker   Gait Pattern Decreased hip/knee flexion - right;Decreased step length - right;Step-through pattern   Gait velocity 1.24  with RW   Standardized Balance Assessment   Standardized Balance Assessment Timed Up and Go Test  30.04 secs with RW     Gait velocity 26.5 secs with RW (1.24 ft/sec)  Let Press seat 15 110# 20 reps bil. LE's:  60#  RLE only 4 sets of 10 reps Gait training inside bars with LUE support 10' x 3 reps; 10' x 1 rep without UE support for balance training Backwards amb. 10' x 2 reps with cues to flex RLE as much as possible (inside bars)  1st TUG score 33.31 secs with RW; 2nd score 30.04 secs with RW Attempted to walk 3" nonstop for endurance training - pt. Amb. 2" 13 secs with RW prior to needing seated rest break  TherEx:   R knee flexion with green theraband seated 2 sets 10 reps with 5 sec hold         PT Short Term Goals - 11/25/14 1752    PT SHORT TERM GOAL #1   Title Return demo HEP with written cues and wife's assistance   Baseline met 11-25-14   Status Achieved   PT SHORT TERM GOAL #2   Title Improve Berg balance test score to >/= 28/56 to reduce fall risk   Baseline met 10-28-14   Status Achieved   PT SHORT TERM GOAL #3   Title  Incr. gait speed by 0.5 ft/sec with LRAD   Baseline velocity 1.12 ft/sec   Status Not Met   PT  SHORT TERM GOAL #4   Title Negotiate 4 steps x 3 reps iwth 1 handrail and S   Baseline met 11-25-14   Status Achieved           PT Long Term Goals - 02/03/15 0952    Additional Long Term Goals   Additional Long Term Goals (p) Yes               Plan - 02/04/15 0951    Clinical Impression Statement Pt. progressing towards goals, however, gait speed is not changing significantly due to new AFO for RLE not yet obtained   Pt will benefit from skilled therapeutic intervention in order to improve on the following deficits Abnormal gait;Decreased endurance;Decreased coordination;Decreased strength;Decreased balance   Rehab Potential Good   PT Frequency 2x / week   PT Duration 4 weeks   PT Treatment/Interventions Gait training;Therapeutic exercise;Patient/family education;Balance training;Stair training;Functional mobility training;Neuromuscular re-education;Therapeutic activities   PT Next Visit Plan cont ther ex   Consulted and Agree with Plan of Care  Patient;Family member/caregiver   Family Member Consulted spouse   PT Plan continue with exercises to strengthen and stretch RLE; standing balance as tolerated (limited by back pain)        Problem List Patient Active Problem List   Diagnosis Date Noted  . Aftercare following surgery of the circulatory system 10/25/2014  . PVD (peripheral vascular disease) 10/25/2014  . Occlusion and stenosis of carotid artery without mention of cerebral infarction 11/16/2013  . Aftercare following surgery of the circulatory system, [place] 10/19/2013  . Peripheral vascular disease, unspecified 10/16/2012  . Venous insufficiency 02/19/2012  . CVA, old, hemiparesis 02/18/2012  . CAD (coronary artery disease) of artery bypass graft 02/18/2012  . Cellulitis 02/17/2012  . HTN (hypertension) 02/17/2012  . 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 [place], [place], [address] Phone: [phone]   Fax:  [phone]

## 2015-02-08 ENCOUNTER — Encounter: Payer: Self-pay | Admitting: Physical Therapy

## 2015-02-08 ENCOUNTER — Ambulatory Visit: Payer: Medicare Other | Admitting: Physical Therapy

## 2015-02-08 DIAGNOSIS — G8191 Hemiplegia, unspecified affecting right dominant side: Secondary | ICD-10-CM

## 2015-02-08 DIAGNOSIS — M6281 Muscle weakness (generalized): Secondary | ICD-10-CM | POA: Diagnosis not present

## 2015-02-08 DIAGNOSIS — Z951 Presence of aortocoronary bypass graft: Secondary | ICD-10-CM | POA: Diagnosis not present

## 2015-02-08 DIAGNOSIS — R269 Unspecified abnormalities of gait and mobility: Secondary | ICD-10-CM

## 2015-02-08 DIAGNOSIS — Z5189 Encounter for other specified aftercare: Secondary | ICD-10-CM | POA: Diagnosis not present

## 2015-02-08 DIAGNOSIS — I69354 Hemiplegia and hemiparesis following cerebral infarction affecting left non-dominant side: Secondary | ICD-10-CM | POA: Diagnosis not present

## 2015-02-08 DIAGNOSIS — I252 Old myocardial infarction: Secondary | ICD-10-CM | POA: Diagnosis not present

## 2015-02-08 NOTE — Therapy (Signed)
[facility] [address] [place], [place], [address] Phone: [phone]   Fax:  [phone]  Physical Therapy Treatment  Patient Details  Name: [name] MRN: [id number] Date of Birth: [birth year] Referring Provider:  Georgann Housekeeper, MD  Encounter Date: 02/08/2015      PT End of Session - 02/08/15 1228    Visit Number 26   Number of Visits 28   Date for PT Re-Evaluation 02/10/15   Authorization Type Medicare   Authorization Time Period 01-03-15 - 03-04-15   PT Start Time 0932   PT Stop Time 1020   PT Time Calculation (min) 48 min   Equipment Utilized During Treatment Gait belt   Activity Tolerance Patient tolerated treatment well   Behavior During Therapy [facility] for tasks assessed/performed      Past Medical History  Diagnosis Date  . Coronary artery disease   . Diabetes mellitus   . Stroke feb 2012    complication of cath  . Myocardial infarction 2011  . Gout   . Peripheral vascular disease   . Arthritis     Gout    Past Surgical History  Procedure Laterality Date  . Coronary artery bypass graft    . Femoral bypass Left Sept. 30, 2011    Left Fem to above knee Pop Artery BPG  . Cardiac catheterization      stent done feb 2012  . Vascular surgery      There were no vitals taken for this visit.  Visit Diagnosis:  Hemiplegia affecting right dominant side  Abnormality of gait      Subjective Assessment - 02/08/15 1225    Symptoms Pt. states he walked 10" total on his treadmill at home; states he would like to be able to use quad cane for assistance with ambulation in the home   Currently in Pain? No/denies                    [facility] Adult PT Treatment/Exercise - 02/08/15 0001    Ambulation/Gait   Ambulation/Gait Yes   Ambulation/Gait Assistance Other (comment)  CGA   Ambulation Distance (Feet) 120 Feet   Assistive device Large base quad cane   Gait Pattern Decreased hip/knee  flexion - right;Decreased step length - right;Step-through pattern     Sidestepping inside parallel bars with LUE support 10' x 2 reps; approx. 57' x1 with RW with cues for step length and  posture  Self care; discussed need to amb. As much as possible at home - regardless if with [facility] or RW; discussed exercises  that pt. Is currently doing at home -including treadmill and upper body equipment           PT Short Term Goals - 02/08/15 1251    Additional Short Term Goals   Additional Short Term Goals Yes           PT Long Term Goals - 02/08/15 1951    PT LONG TERM GOAL #2   Title Incr. gait velocity to >/= 1.4 ft/sec with RW   Baseline 1.06 ft/sec on 01-04-15  target date 03-10-15   Time 4   Period Weeks   Status On-going   PT LONG TERM GOAL #4   Title Amb. 350' with new AFO on RLE with RW with S.   Baseline target date 03-10-15 as AFO not yet obtained as of 02-03-15   Time 4   Period Weeks   Status On-going   PT  LONG TERM GOAL #5   Title Increase gait velocity to >/= 1.4 ft/sec with RW   Baseline target date 03-10-15   Time 4   Period Weeks   Status On-going               Plan - 02/08/15 1229    Clinical Impression Statement Pt.'s posture is more upright less trunk flexion with use of quad cane than with RW; pt. had 1 occurrence of R foot "sticking" to floor but pt. able to achieve clearance and prevent LOB with use of QC   Pt will benefit from skilled therapeutic intervention in order to improve on the following deficits Abnormal gait;Decreased endurance;Decreased coordination;Decreased strength;Decreased balance   Rehab Potential Good   PT Frequency 2x / week   PT Duration 4 weeks   PT Treatment/Interventions Gait training;Therapeutic exercise;Patient/family education;Balance training;Stair training;Functional mobility training;Neuromuscular re-education;Therapeutic activities   PT Next Visit Plan cont ther ex   PT Home Exercise Plan ambulate as much as  tolerated   Consulted and Agree with Plan of Care Patient;Family member/caregiver   PT Plan continue with exercises to strengthen and stretch RLE; standing balance as tolerated (limited by back pain)        Problem List Patient Active Problem List   Diagnosis Date Noted  . Aftercare following surgery of the circulatory system 10/25/2014  . PVD (peripheral vascular disease) 10/25/2014  . Occlusion and stenosis of carotid artery without mention of cerebral infarction 11/16/2013  . Aftercare following surgery of the circulatory system, [place] 10/19/2013  . Peripheral vascular disease, unspecified 10/16/2012  . Venous insufficiency 02/19/2012  . CVA, old, hemiparesis 02/18/2012  . CAD (coronary artery disease) of artery bypass graft 02/18/2012  . Cellulitis 02/17/2012  . HTN (hypertension) 02/17/2012  . 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

## 2015-02-10 ENCOUNTER — Ambulatory Visit: Payer: Medicare Other | Admitting: Physical Therapy

## 2015-02-10 ENCOUNTER — Encounter: Payer: Self-pay | Admitting: Physical Therapy

## 2015-02-10 DIAGNOSIS — R269 Unspecified abnormalities of gait and mobility: Secondary | ICD-10-CM | POA: Diagnosis not present

## 2015-02-10 DIAGNOSIS — M6281 Muscle weakness (generalized): Secondary | ICD-10-CM | POA: Diagnosis not present

## 2015-02-10 DIAGNOSIS — Z951 Presence of aortocoronary bypass graft: Secondary | ICD-10-CM | POA: Diagnosis not present

## 2015-02-10 DIAGNOSIS — I69354 Hemiplegia and hemiparesis following cerebral infarction affecting left non-dominant side: Secondary | ICD-10-CM | POA: Diagnosis not present

## 2015-02-10 DIAGNOSIS — Z5189 Encounter for other specified aftercare: Secondary | ICD-10-CM | POA: Diagnosis not present

## 2015-02-10 DIAGNOSIS — I252 Old myocardial infarction: Secondary | ICD-10-CM | POA: Diagnosis not present

## 2015-02-10 DIAGNOSIS — G8191 Hemiplegia, unspecified affecting right dominant side: Secondary | ICD-10-CM

## 2015-02-10 NOTE — Therapy (Signed)
[facility] [address] [place], [place], [address] Phone: [phone]   Fax:  [phone]  Physical Therapy Treatment  Patient Details  Name: [name] MRN: [id number] Date of Birth: 1946-07-18 Referring Provider:  Georgann Housekeeper, MD  Encounter Date: 02/10/2015      PT End of Session - 02/10/15 1318    Visit Number 27   Number of Visits 35   Date for PT Re-Evaluation 03/10/15   Authorization Type Medicare   Authorization Time Period 01-03-15 - 03-04-15   PT Start Time 0935   PT Stop Time 1019   PT Time Calculation (min) 44 min      Past Medical History  Diagnosis Date  . Coronary artery disease   . Diabetes mellitus   . Stroke feb 2012    complication of cath  . Myocardial infarction 2011  . Gout   . Peripheral vascular disease   . Arthritis     Gout    Past Surgical History  Procedure Laterality Date  . Coronary artery bypass graft    . Femoral bypass Left Sept. 30, 2011    Left Fem to above knee Pop Artery BPG  . Cardiac catheterization      stent done feb 2012  . Vascular surgery      There were no vitals taken for this visit.  Visit Diagnosis:  Abnormality of gait  Hemiplegia affecting right dominant side      Subjective Assessment - 02/10/15 1304    Symptoms Pt. states he walked up and down steps several times yesterday; reports walking on treadmill for 6" nonstop yesterday - states he feels good about doing that   Currently in Pain? No/denies                    [facility] Adult PT Treatment/Exercise - 02/10/15 0001    Ambulation/Gait   Ambulation/Gait Yes   Ambulation/Gait Assistance Other (comment)  CGA   Ambulation Distance (Feet) 120 Feet   Assistive device Large base quad cane   Gait Pattern Decreased hip/knee flexion - right;Decreased step length - right;Step-through pattern   Dynamic Standing Balance   Dynamic Standing - Comments Pt. performed standing  balance exercises  including forward, back and side kicks x 10 reps each with UE support prn      Added above standing exercises to HEP - kicks, single limb stance and marching and sidestepping at countertop TherEx;  Weight bearing for RUE closed chain - hand on counter - modified push up with R elbow flexed then  Extended x 10 reps; elbow extended - leaning body forward on counter with RUE stationary Partial squats without UE support x 10 reps   Neuro Re-ed: alternate tap ups 10 reps each LE to 4" step with bil. UE support; 10 reps LLE only for R single limb  Stance x 10 reps with bil. UE support  standing unsupported with horizontal head turns at side of mat with CGA - side/to side and up/down; pt. Had no  LOB with this activity       PT Education - 02/10/15 1317    Education provided Yes   Education Details Added standing kicks (3 directions), marching, sidestepping and single limb stance to HEP for balance   Person(s) Educated Patient;Spouse   Methods Explanation;Demonstration;Handout   Comprehension Verbalized understanding;Returned demonstration          PT Short Term Goals - 02/08/15 1251    Additional Short Term  Goals   Additional Short Term Goals Yes           PT Long Term Goals - 02/08/15 1951    PT LONG TERM GOAL #2   Title Incr. gait velocity to >/= 1.4 ft/sec with RW   Baseline 1.06 ft/sec on 01-04-15  target date 03-10-15   Time 4   Period Weeks   Status On-going   PT LONG TERM GOAL #4   Title Amb. 350' with new AFO on RLE with RW with S.   Baseline target date 03-10-15 as AFO not yet obtained as of 02-03-15   Time 4   Period Weeks   Status On-going   PT LONG TERM GOAL #5   Title Increase gait velocity to >/= 1.4 ft/sec with RW   Baseline target date 03-10-15   Time 4   Period Weeks   Status On-going               Plan - 02/10/15 1322    Clinical Impression Statement Pt. cont. to fatigue quickly - requires seated rest period after amb. 120'  with quad cane; pt. tolerated standing balance exercises well with c/o back discomfort at 2/10 intensity    Pt will benefit from skilled therapeutic intervention in order to improve on the following deficits Abnormal gait;Decreased endurance;Decreased coordination;Decreased strength;Decreased balance   Rehab Potential Good   PT Frequency 2x / week   PT Duration 4 weeks   PT Treatment/Interventions Gait training;Therapeutic exercise;Patient/family education;Balance training;Stair training;Functional mobility training;Neuromuscular re-education;Therapeutic activities   PT Next Visit [facility]; work on pt. donning new AFO and shoe   PT Home Exercise Plan ambulate as much as tolerated; added balance HEP   Consulted and Agree with Plan of Care Patient;Family member/caregiver   Family Member Consulted spouse   PT Plan continue with exercises to strengthen and stretch RLE; standing balance as tolerated (limited by back pain)        Problem List Patient Active Problem List   Diagnosis Date Noted  . Aftercare following surgery of the circulatory system 10/25/2014  . PVD (peripheral vascular disease) 10/25/2014  . Occlusion and stenosis of carotid artery without mention of cerebral infarction 11/16/2013  . Aftercare following surgery of the circulatory system, [place] 10/19/2013  . Peripheral vascular disease, unspecified 10/16/2012  . Venous insufficiency 02/19/2012  . CVA, old, hemiparesis 02/18/2012  . CAD (coronary artery disease) of artery bypass graft 02/18/2012  . Cellulitis 02/17/2012  . HTN (hypertension) 02/17/2012  . 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

## 2015-02-17 ENCOUNTER — Telehealth: Payer: Self-pay | Admitting: *Deleted

## 2015-02-17 NOTE — Telephone Encounter (Signed)
called the patient and his wife understood and comfirmed the change

## 2015-02-21 ENCOUNTER — Ambulatory Visit: Payer: Medicare Other | Admitting: Physical Therapy

## 2015-02-21 ENCOUNTER — Encounter: Payer: Self-pay | Admitting: Physical Therapy

## 2015-02-21 DIAGNOSIS — R269 Unspecified abnormalities of gait and mobility: Secondary | ICD-10-CM | POA: Diagnosis not present

## 2015-02-21 DIAGNOSIS — Z951 Presence of aortocoronary bypass graft: Secondary | ICD-10-CM | POA: Diagnosis not present

## 2015-02-21 DIAGNOSIS — I69354 Hemiplegia and hemiparesis following cerebral infarction affecting left non-dominant side: Secondary | ICD-10-CM | POA: Diagnosis not present

## 2015-02-21 DIAGNOSIS — I252 Old myocardial infarction: Secondary | ICD-10-CM | POA: Diagnosis not present

## 2015-02-21 DIAGNOSIS — Z5189 Encounter for other specified aftercare: Secondary | ICD-10-CM | POA: Diagnosis not present

## 2015-02-21 DIAGNOSIS — M6281 Muscle weakness (generalized): Secondary | ICD-10-CM | POA: Diagnosis not present

## 2015-02-21 NOTE — Therapy (Signed)
[facility] [address] [place], [place], [address] Phone: [phone]   Fax:  [phone]  Physical Therapy Treatment  Patient Details  Name: Craig DeedClarence M [name] MRN: [id number] Date of Birth: May 27, 1946 Referring Provider:  Georgann HousekeeperHusain, Karrar, MD  Encounter Date: 02/21/2015      PT End of Session - 02/21/15 1232    Visit Number 28   Number of Visits 35   Date for PT Re-Evaluation 03/10/15   Authorization Type Medicare   Authorization Time Period 01-03-15 - 03-04-15   PT Start Time 0936   PT Stop Time 1020   PT Time Calculation (min) 44 min      Past Medical History  Diagnosis Date  . Coronary artery disease   . Diabetes mellitus   . Stroke feb 2012    complication of cath  . Myocardial infarction 2011  . Gout   . Peripheral vascular disease   . Arthritis     Gout    Past Surgical History  Procedure Laterality Date  . Coronary artery bypass graft    . Femoral bypass Left Sept. 30, 2011    Left Fem to above knee Pop Artery BPG  . Cardiac catheterization      stent done feb 2012  . Vascular surgery      There were no vitals taken for this visit.  Visit Diagnosis:  Abnormality of gait      Subjective Assessment - 02/21/15 1226    Symptoms "I don't like this brace - I feel like my foot is going to roll in"; Pt. also states that right foot scuffs the floor when he gets tired, even with this new brace (Ottobock reaction AFO)   Pertinent History s/p old L CVA with R hemiparesis   Currently in Pain? No/denies      Orthotic Training:  Pt. wearing an Ottobock walk on reaction AFO which he received from [facility] last Wed., 02-16-15;  Pt. States he does not like this brace as he feels his foot is supinating; heel wedge trialed both medially and laterally -  Pt. Gait trained approx. 50' x 1 with Ottobock AFO with heel wedge with use of rollator Trialed blue rocker AFO on RLE - pt. Gait trained 150' with rollator  with CGA - pt. Had good clearance and stated that he did Not feel that his foot was turning in (supinating)  Called [facility] and informed orthotist of pt. disliking Ottobock AFO and requesting blue rocker AFO - pt. Instructed to call [facility] For appt. - pt. And wife verbalized understanding                        PT Short Term Goals - 02/08/15 1251    Additional Short Term Goals   Additional Short Term Goals Yes           PT Long Term Goals - 02/08/15 1951    PT LONG TERM GOAL #2   Title Incr. gait velocity to >/= 1.4 ft/sec with RW   Baseline 1.06 ft/sec on 01-04-15  target date 03-10-15   Time 4   Period Weeks   Status On-going   PT LONG TERM GOAL #4   Title Amb. 350' with new AFO on RLE with RW with S.   Baseline target date 03-10-15 as AFO not yet obtained as of 02-03-15   Time 4   Period Weeks   Status On-going   PT LONG TERM GOAL #5  Title Increase gait velocity to >/= 1.4 ft/sec with RW   Baseline target date 03-10-15   Time 4   Period Weeks   Status On-going               Plan - 02/21/15 1234    Clinical Impression Statement Pt.'s gait is much improved with use of Blue rocker AFO on RLE; pt. has good foot clearance on RLE in swing phase of gait with use of Blue rocker;  pt. reports no feelings of R ankle turning with use of this brace   Pt will benefit from skilled therapeutic intervention in order to improve on the following deficits Abnormal gait;Decreased endurance;Decreased coordination;Decreased strength;Decreased balance   Rehab Potential Good   PT Frequency 2x / week   PT Duration 4 weeks   PT Treatment/Interventions Gait training;Therapeutic exercise;Patient/family education;Balance training;Stair training;Functional mobility training;Neuromuscular re-education;Therapeutic activities   PT Next Visit Plan gait train with [facility] with new AFO; work on pt. donning new AFO and shoe   PT Home Exercise Plan ambulate as much as tolerated; added  balance HEP   Consulted and Agree with Plan of Care Patient;Family member/caregiver   Family Member Consulted spouse   PT Plan work on gait with blue rocker AFO with quad cane        Problem List Patient Active Problem List   Diagnosis Date Noted  . Aftercare following surgery of the circulatory system 10/25/2014  . PVD (peripheral vascular disease) 10/25/2014  . Occlusion and stenosis of carotid artery without mention of cerebral infarction 11/16/2013  . Aftercare following surgery of the circulatory system, NEC 10/19/2013  . Peripheral vascular disease, unspecified 10/16/2012  . Venous insufficiency 02/19/2012  . CVA, old, hemiparesis 02/18/2012  . CAD (coronary artery disease) of artery bypass graft 02/18/2012  . Cellulitis 02/17/2012  . HTN (hypertension) 02/17/2012  . 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 [place], [place], [address] Phone: [phone]   Fax:  [phone]

## 2015-02-22 ENCOUNTER — Ambulatory Visit: Payer: Medicare Other | Attending: Physical Medicine & Rehabilitation | Admitting: Physical Therapy

## 2015-02-22 DIAGNOSIS — M6281 Muscle weakness (generalized): Secondary | ICD-10-CM | POA: Insufficient documentation

## 2015-02-22 DIAGNOSIS — I252 Old myocardial infarction: Secondary | ICD-10-CM | POA: Diagnosis not present

## 2015-02-22 DIAGNOSIS — I69354 Hemiplegia and hemiparesis following cerebral infarction affecting left non-dominant side: Secondary | ICD-10-CM | POA: Insufficient documentation

## 2015-02-22 DIAGNOSIS — Z5189 Encounter for other specified aftercare: Secondary | ICD-10-CM | POA: Diagnosis not present

## 2015-02-22 DIAGNOSIS — Z951 Presence of aortocoronary bypass graft: Secondary | ICD-10-CM | POA: Insufficient documentation

## 2015-02-22 DIAGNOSIS — R269 Unspecified abnormalities of gait and mobility: Secondary | ICD-10-CM | POA: Diagnosis not present

## 2015-02-23 NOTE — Therapy (Signed)
[facility] [address] [place], [place], [address] Phone: [phone]   Fax:  [phone]  Physical Therapy Treatment  Patient Details  Name: [name] MRN: [id number] Date of Birth: 1946/09/05 Referring Provider:  Georgann HousekeeperHusain, Karrar, MD  Encounter Date: 02/22/2015      PT End of Session - 02/23/15 0855    Visit Number 29   Number of Visits 35   Date for PT Re-Evaluation 03/10/15   Authorization Type Medicare   Authorization Time Period 01-03-15 - 03-04-15   PT Start Time 1017   PT Stop Time 1050   PT Time Calculation (min) 33 min      Past Medical History  Diagnosis Date  . Coronary artery disease   . Diabetes mellitus   . Stroke feb 2012    complication of cath  . Myocardial infarction 2011  . Gout   . Peripheral vascular disease   . Arthritis     Gout    Past Surgical History  Procedure Laterality Date  . Coronary artery bypass graft    . Femoral bypass Left Sept. 30, 2011    Left Fem to above knee Pop Artery BPG  . Cardiac catheterization      stent done feb 2012  . Vascular surgery      There were no vitals taken for this visit.  Visit Diagnosis:  Abnormality of gait      Subjective Assessment - 02/23/15 0848    Symptoms Pt. states he has an appt. Friday at [facility] to change R Ottobock AFO for [employer]er; states he is wearing his old brace today (PLS)   Pertinent History s/p old L CVA with R hemiparesis   Patient Stated Goals Improve balance and walking   Currently in Pain? No/denies                    [facility] Adult [facility]/Exercise - 02/23/15 0001    Ambulation/Gait   Ambulation/Gait Yes   Ambulation/Gait Assistance Other (comment)  CGA   Ambulation Distance (Feet) 120 Feet   Assistive device Large base quad cane   Gait Pattern Decreased hip/knee flexion - right;Decreased step length - right;Step-through pattern   Dynamic Standing Balance   Dynamic Standing -  Comments Pt. performed standing balance kicks - all 3 directions - forward, back and side and marching in place; sidestepping along countertop     Gait:  Blue rocker AFO donned on RLE - pt. gait trained with [facility] 120' with CGA and 50' x 1 rep with cues for  posture and step length Step training with left hand rail using a step by step sequence with SBA  Neuro Re-ed;  Single limb stance activities for RLE and LLE - touching stepping stones (targets) with UE support On counter and with [facility] for RUE support           PT Short Term Goals - 02/08/15 1251    Additional Short Term Goals   Additional Short Term Goals Yes           PT Long Term Goals - 02/08/15 1951    PT LONG TERM GOAL #2   Title Incr. gait velocity to >/= 1.4 ft/sec with RW   Baseline 1.06 ft/sec on 01-04-15  target date 03-10-15   Time 4   Period Weeks   Status On-going   PT LONG TERM GOAL #4   Title Amb. 350' with new AFO on RLE with RW with  S.   Baseline target date 03-10-15 as AFO not yet obtained as of 02-03-15   Time 4   Period Weeks   Status On-going   PT LONG TERM GOAL #5   Title Increase gait velocity to >/= 1.4 ft/sec with RW   Baseline target date 03-10-15   Time 4   Period Weeks   Status On-going               Problem List Patient Active Problem List   Diagnosis Date Noted  . Aftercare following surgery of the circulatory system 10/25/2014  . PVD (peripheral vascular disease) 10/25/2014  . Occlusion and stenosis of carotid artery without mention of cerebral infarction 11/16/2013  . Aftercare following surgery of the circulatory system, NEC 10/19/2013  . Peripheral vascular disease, unspecified 10/16/2012  . Venous insufficiency 02/19/2012  . CVA, old, hemiparesis 02/18/2012  . CAD (coronary artery disease) of artery bypass graft 02/18/2012  . Cellulitis 02/17/2012  . HTN (hypertension) 02/17/2012  . 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 [place], [place], [address] Phone: [phone]   Fax:  [phone]

## 2015-03-01 ENCOUNTER — Encounter: Payer: Self-pay | Admitting: Physical Therapy

## 2015-03-01 ENCOUNTER — Ambulatory Visit: Payer: Medicare Other | Admitting: Physical Therapy

## 2015-03-01 DIAGNOSIS — G8191 Hemiplegia, unspecified affecting right dominant side: Secondary | ICD-10-CM

## 2015-03-01 DIAGNOSIS — M6281 Muscle weakness (generalized): Secondary | ICD-10-CM | POA: Diagnosis not present

## 2015-03-01 DIAGNOSIS — I69354 Hemiplegia and hemiparesis following cerebral infarction affecting left non-dominant side: Secondary | ICD-10-CM | POA: Diagnosis not present

## 2015-03-01 DIAGNOSIS — Z5189 Encounter for other specified aftercare: Secondary | ICD-10-CM | POA: Diagnosis not present

## 2015-03-01 DIAGNOSIS — Z951 Presence of aortocoronary bypass graft: Secondary | ICD-10-CM | POA: Diagnosis not present

## 2015-03-01 DIAGNOSIS — R269 Unspecified abnormalities of gait and mobility: Secondary | ICD-10-CM

## 2015-03-01 DIAGNOSIS — I252 Old myocardial infarction: Secondary | ICD-10-CM | POA: Diagnosis not present

## 2015-03-01 NOTE — Therapy (Signed)
[facility] [address] [place], [place], [address] Phone: [phone]   Fax:  [phone]  Physical Therapy Treatment  Patient Details  Name: [name] MRN: [id number] Date of Birth: 02-03-1946 Referring Provider:  Georgann Housekeeper, MD  Encounter Date: 03/01/2015      PT End of Session - 03/01/15 2119    Visit Number 30  G10   Number of Visits 35   Date for PT Re-Evaluation 03/10/15   Authorization Type Medicare   Authorization Time Period 01-03-15 - 03-04-15   PT Start Time 1020   PT Stop Time 1102   PT Time Calculation (min) 42 min   Equipment Utilized During Treatment Gait belt   Activity Tolerance Patient tolerated treatment well      Past Medical History  Diagnosis Date  . Coronary artery disease   . Diabetes mellitus   . Stroke feb 2012    complication of cath  . Myocardial infarction 2011  . Gout   . Peripheral vascular disease   . Arthritis     Gout    Past Surgical History  Procedure Laterality Date  . Coronary artery bypass graft    . Femoral bypass Left Sept. 30, 2011    Left Fem to above knee Pop Artery BPG  . Cardiac catheterization      stent done feb 2012  . Vascular surgery      There were no vitals taken for this visit.  Visit Diagnosis:  Abnormality of gait  Hemiplegia affecting right dominant side      Subjective Assessment - 03/01/15 2116    Symptoms Pt. has received new Blue rocker AFO (from [place] on Fri)  "I love this brace"   Pertinent History s/p old L CVA with R hemiparesis   Patient Stated Goals Improve balance and walking   Currently in Pain? No/denies                    [facility] Adult PT Treatment/Exercise - 03/01/15 0001    Ambulation/Gait   Ambulation/Gait Yes   Ambulation/Gait Assistance Other (comment)  CGA to SBA   Ambulation Distance (Feet) 120 Feet   Assistive device Large base quad cane   Gait Pattern Decreased hip/knee flexion -  right;Decreased step length - right;Step-through pattern     Orthotic training; pt. Wearing blue rocker AFO - gait trained 10' x 6 reps inside bars without UE support; able to turn slowly Without UE support: Pt. Gait trained 120' with RW with cues for posture and step length Step training with use of 2 rails step by step pattern with SBA  NeuroRe-ed:  Single limb stance activities - standing and touching balance bubbles with UE support prn with CGA to min A Playing zoomball standing inside parallel bars with SBA to CGA - pt. Had no difficulty maintaining balance during this activity Sidestepping inside parallel bars with 1 UE support with CGA             PT Short Term Goals - 02/08/15 1251    Additional Short Term Goals   Additional Short Term Goals Yes           PT Long Term Goals - 02/08/15 1951    PT LONG TERM GOAL #2   Title Incr. gait velocity to >/= 1.4 ft/sec with RW   Baseline 1.06 ft/sec on 01-04-15  target date 03-10-15   Time 4   Period Weeks   Status On-going  PT LONG TERM GOAL #4   Title Amb. 350' with new AFO on RLE with RW with S.   Baseline target date 03-10-15 as AFO not yet obtained as of 02-03-15   Time 4   Period Weeks   Status On-going   PT LONG TERM GOAL #5   Title Increase gait velocity to >/= 1.4 ft/sec with RW   Baseline target date 03-10-15   Time 4   Period Weeks   Status On-going               Plan - 03/01/15 2122    Clinical Impression Statement Pt. has increased R foot clearance in swing with use of blue rocker AFO and has no supination as he did with Ottobock Reaction AFO; pt. says he feels much more stable with this brace than he did with the Ottobock   Pt will benefit from skilled therapeutic intervention in order to improve on the following deficits Abnormal gait;Decreased endurance;Decreased coordination;Decreased strength;Decreased balance   Rehab Potential Good   PT Frequency 2x / week   PT Duration 4 weeks   PT  Treatment/Interventions Gait training;Therapeutic exercise;Patient/family education;Balance training;Stair training;Functional mobility training;Neuromuscular re-education;Therapeutic activities   PT Next Visit Plan gait with blue rocker AFO; cont. balance training   PT Home Exercise Plan ambulate as much as tolerated; added balance HEP   Consulted and Agree with Plan of Care Patient;Family member/caregiver   Family Member Consulted spouse   PT Plan work on gait with blue rocker AFO with quad cane          G-Codes - 03/01/15 2125    Functional Assessment Tool Used TUG with RW 35.09 secs:  gait velocity 1.06 ft/sec with RW   Functional Limitation Mobility: Walking and moving around   Mobility: Walking and Moving Around Current Status [phone]([id number]) At least 40 percent but less than 60 percent impaired, limited or restricted   Mobility: Walking and Moving Around Goal Status [phone](G8979) At least 40 percent but less than 60 percent impaired, limited or restricted      Problem List Patient Active Problem List   Diagnosis Date Noted  . Aftercare following surgery of the circulatory system 10/25/2014  . PVD (peripheral vascular disease) 10/25/2014  . Occlusion and stenosis of carotid artery without mention of cerebral infarction 11/16/2013  . Aftercare following surgery of the circulatory system, NEC 10/19/2013  . Peripheral vascular disease, unspecified 10/16/2012  . Venous insufficiency 02/19/2012  . CVA, old, hemiparesis 02/18/2012  . CAD (coronary artery disease) of artery bypass graft 02/18/2012  . Cellulitis 02/17/2012  . HTN (hypertension) 02/17/2012  . 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

## 2015-03-08 ENCOUNTER — Ambulatory Visit: Payer: Self-pay | Admitting: Adult Health

## 2015-03-09 ENCOUNTER — Ambulatory Visit: Payer: Medicare Other | Admitting: Physical Therapy

## 2015-03-09 DIAGNOSIS — I69354 Hemiplegia and hemiparesis following cerebral infarction affecting left non-dominant side: Secondary | ICD-10-CM | POA: Diagnosis not present

## 2015-03-09 DIAGNOSIS — Z5189 Encounter for other specified aftercare: Secondary | ICD-10-CM | POA: Diagnosis not present

## 2015-03-09 DIAGNOSIS — M6281 Muscle weakness (generalized): Secondary | ICD-10-CM | POA: Diagnosis not present

## 2015-03-09 DIAGNOSIS — Z951 Presence of aortocoronary bypass graft: Secondary | ICD-10-CM | POA: Diagnosis not present

## 2015-03-09 DIAGNOSIS — R269 Unspecified abnormalities of gait and mobility: Secondary | ICD-10-CM | POA: Diagnosis not present

## 2015-03-09 DIAGNOSIS — G8191 Hemiplegia, unspecified affecting right dominant side: Secondary | ICD-10-CM

## 2015-03-09 DIAGNOSIS — I252 Old myocardial infarction: Secondary | ICD-10-CM | POA: Diagnosis not present

## 2015-03-10 ENCOUNTER — Encounter: Payer: Self-pay | Admitting: Physical Therapy

## 2015-03-10 DIAGNOSIS — N182 Chronic kidney disease, stage 2 (mild): Secondary | ICD-10-CM | POA: Diagnosis not present

## 2015-03-10 DIAGNOSIS — I63422 Cerebral infarction due to embolism of left anterior cerebral artery: Secondary | ICD-10-CM | POA: Diagnosis not present

## 2015-03-10 DIAGNOSIS — E1122 Type 2 diabetes mellitus with diabetic chronic kidney disease: Secondary | ICD-10-CM | POA: Diagnosis not present

## 2015-03-10 DIAGNOSIS — E780 Pure hypercholesterolemia: Secondary | ICD-10-CM | POA: Diagnosis not present

## 2015-03-10 DIAGNOSIS — I69359 Hemiplegia and hemiparesis following cerebral infarction affecting unspecified side: Secondary | ICD-10-CM | POA: Diagnosis not present

## 2015-03-10 DIAGNOSIS — I10 Essential (primary) hypertension: Secondary | ICD-10-CM | POA: Diagnosis not present

## 2015-03-10 NOTE — Therapy (Signed)
[facility] [address] [place], [place], [address] Phone: [phone]   Fax:  [phone]  Physical Therapy Treatment  Patient Details  Name: [name] MRN: [id number] Date of Birth: 02-04-1946 Referring Provider:  Wenda Low, MD  Encounter Date: 03/09/2015      PT End of Session - 03/10/15 1605    Visit Number 31  G1   Number of Visits 35   Date for PT Re-Evaluation 04/08/15   Authorization Type Medicare   Authorization Time Period 03-09-15 - 04-23-15   PT Start Time 1021   PT Stop Time 1105   PT Time Calculation (min) 44 min      Past Medical History  Diagnosis Date  . Coronary artery disease   . Diabetes mellitus   . Stroke feb 2012    complication of cath  . Myocardial infarction 2011  . Gout   . Peripheral vascular disease   . Arthritis     Gout    Past Surgical History  Procedure Laterality Date  . Coronary artery bypass graft    . Femoral bypass Left Sept. 30, 2011    Left Fem to above knee Pop Artery BPG  . Cardiac catheterization      stent done feb 2012  . Vascular surgery      There were no vitals filed for this visit.  Visit Diagnosis:  Abnormality of gait - Plan: PT plan of care cert/re-cert  Hemiplegia affecting right dominant side - Plan: PT plan of care cert/re-cert      Subjective Assessment - 03/10/15 1559    Symptoms Pt. states he is walking more at home- continues to like new brace and reports wearing it to walk on treadmill   Pertinent History s/p old L CVA with R hemiparesis   Patient Stated Goals Improve balance and walking   Currently in Pain? No/denies                       [facility] Adult PT Treatment/Exercise - 03/10/15 0001    Ambulation/Gait   Ambulation/Gait Yes   Ambulation/Gait Assistance Other (comment)  CGA to SBA   Ambulation Distance (Feet) 150 Feet   Assistive device 4-wheeled walker   Gait Pattern Decreased hip/knee flexion  - right;Decreased step length - right;Step-through pattern   Gait Comments Pt. gait trained inside bars without UE support with SBA with cues for posture and step length - 10' x 6 reps   Knee/Hip Exercises: Stretches   Active Hamstring Stretch 1 rep;20 seconds  RLE                PT Education - 03/10/15 1604    Education provided Yes   Education Details Pt. instructed to perform hamstring stretch and walk as much as possible in the home   Person(s) Educated Patient;Spouse   Methods Explanation;Demonstration   Comprehension Verbalized understanding;Returned demonstration          PT Short Term Goals - 02/08/15 1251    Additional Short Term Goals   Additional Short Term Goals Yes           PT Long Term Goals - 03/10/15 1614    PT LONG TERM GOAL #1   Title Amb. 250' with RW with SBA   Status Achieved   PT LONG TERM GOAL #2   Title Incr. gait velocity to >/= 1.4 ft/sec with RW   Status On-going   PT LONG TERM GOAL #3  Title Independent in updated HEP   Status Achieved   PT LONG TERM GOAL #4   Title Amb. 350' with new AFO on RLE with RW with S.   Baseline met 03-09-15   Status Achieved   PT LONG TERM GOAL #5   Status On-going   PT LONG TERM GOAL #6   Title Negotiate steps with 2 rails with pt. reporting incr. R hip flexion during ascension with step negotiation at home   Baseline met 03-09-15   Status Achieved               Plan - 03/10/15 1611    Clinical Impression Statement Pt.'s gait is much improved with use of blue rocker - pt. sates he feels more secure with use of RW rather than with quad cane   Pt will benefit from skilled therapeutic intervention in order to improve on the following deficits Abnormal gait;Decreased endurance;Decreased coordination;Decreased strength;Decreased balance   Rehab Potential Good   PT Frequency 2x / week   PT Duration 4 weeks   PT Treatment/Interventions Gait training;Therapeutic exercise;Patient/family  education;Balance training;Stair training;Functional mobility training;Neuromuscular re-education;Therapeutic activities   PT Next Visit Plan cont gait adn balance   PT Home Exercise Plan added hamstring stretch and walking as much as possible   Consulted and Agree with Plan of Care Patient;Family member/caregiver   Family Member Consulted spouse   PT Plan cont to work on gait and standing balance        Problem List Patient Active Problem List   Diagnosis Date Noted  . Aftercare following surgery of the circulatory system 10/25/2014  . PVD (peripheral vascular disease) 10/25/2014  . Occlusion and stenosis of carotid artery without mention of cerebral infarction 11/16/2013  . Aftercare following surgery of the circulatory system, [place] 10/19/2013  . Peripheral vascular disease, unspecified 10/16/2012  . Venous insufficiency 02/19/2012  . CVA, old, hemiparesis 02/18/2012  . CAD (coronary artery disease) of artery bypass graft 02/18/2012  . Cellulitis 02/17/2012  . HTN (hypertension) 02/17/2012  . 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 [place], [place], [address] Phone: [phone]   Fax:  [phone]

## 2015-03-14 ENCOUNTER — Encounter: Payer: Self-pay | Admitting: Physical Therapy

## 2015-03-14 ENCOUNTER — Ambulatory Visit: Payer: Medicare Other | Admitting: Physical Therapy

## 2015-03-14 DIAGNOSIS — R269 Unspecified abnormalities of gait and mobility: Secondary | ICD-10-CM | POA: Diagnosis not present

## 2015-03-14 DIAGNOSIS — Z951 Presence of aortocoronary bypass graft: Secondary | ICD-10-CM | POA: Diagnosis not present

## 2015-03-14 DIAGNOSIS — G8191 Hemiplegia, unspecified affecting right dominant side: Secondary | ICD-10-CM

## 2015-03-14 DIAGNOSIS — M6281 Muscle weakness (generalized): Secondary | ICD-10-CM | POA: Diagnosis not present

## 2015-03-14 DIAGNOSIS — I252 Old myocardial infarction: Secondary | ICD-10-CM | POA: Diagnosis not present

## 2015-03-14 DIAGNOSIS — I69354 Hemiplegia and hemiparesis following cerebral infarction affecting left non-dominant side: Secondary | ICD-10-CM | POA: Diagnosis not present

## 2015-03-14 DIAGNOSIS — Z5189 Encounter for other specified aftercare: Secondary | ICD-10-CM | POA: Diagnosis not present

## 2015-03-14 NOTE — Therapy (Signed)
[facility] [address] [place], [place], [address] Phone: [phone]   Fax:  [phone]  Physical Therapy Treatment  Patient Details  Name: [name] MRN: [id number] Date of Birth: [birth year] Referring Provider:  Wenda Low, MD  Encounter Date: 03/14/2015      PT End of Session - 03/14/15 2010    Visit Number 32  G2   Number of Visits 35   Date for PT Re-Evaluation 04/08/15   Authorization Type Medicare   Authorization Time Period 03-09-15 - 04-23-15   PT Start Time 1146   PT Stop Time 1235   PT Time Calculation (min) 49 min      Past Medical History  Diagnosis Date  . Coronary artery disease   . Diabetes mellitus   . Stroke feb 2012    complication of cath  . Myocardial infarction 2011  . Gout   . Peripheral vascular disease   . Arthritis     Gout    Past Surgical History  Procedure Laterality Date  . Coronary artery bypass graft    . Femoral bypass Left Sept. 30, 2011    Left Fem to above knee Pop Artery BPG  . Cardiac catheterization      stent done feb 2012  . Vascular surgery      There were no vitals filed for this visit.  Visit Diagnosis:  Abnormality of gait  Hemiplegia affecting right dominant side      Subjective Assessment - 03/14/15 2007    Symptoms Pt. states his right eye is bothering him today - feels like his eyelid is in his eye but says it does not hurt "not painful"   Pertinent History s/p old L CVA with R hemiparesis   Patient Stated Goals Improve balance and walking   Currently in Pain? No/denies                       [facility] Adult PT Treatment/Exercise - 03/14/15 0001    Ambulation/Gait   Ambulation/Gait Yes   Ambulation/Gait Assistance Other (comment)  CGA to SBA   Ambulation Distance (Feet) 120 Feet   Assistive device 4-wheeled walker   Gait Pattern Decreased hip/knee flexion - right;Decreased step length - right;Step-through pattern    Gait Comments Pt. gait trained inside bars without UE support with SBA with cues for posture and step length - 10' x 6 reps   Lumbar Exercises: Supine   Clam 10 reps   Heel Slides 10 reps   Bent Knee Raise 10 reps   Bridge 10 reps   Straight Leg Raise 10 reps     Neuro Re-ed: standing balance exercises at counter - kicks 10 reps each LE including forward, back and side; Marching x 10 reps each LE in standing              PT Short Term Goals - 02/08/15 1251    Additional Short Term Goals   Additional Short Term Goals Yes           PT Long Term Goals - 03/10/15 1614    PT LONG TERM GOAL #1   Title Amb. 250' with RW with SBA   Status Achieved   PT LONG TERM GOAL #2   Title Incr. gait velocity to >/= 1.4 ft/sec with RW   Status On-going   PT LONG TERM GOAL #3   Title Independent in updated HEP   Status Achieved   PT LONG  TERM GOAL #4   Title Amb. 350' with new AFO on RLE with RW with S.   Baseline met 03-09-15   Status Achieved   PT LONG TERM GOAL #5   Status On-going   PT LONG TERM GOAL #6   Title Negotiate steps with 2 rails with pt. reporting incr. R hip flexion during ascension with step negotiation at home   Baseline met 03-09-15   Status Achieved               Plan - 03/14/15 2011    Clinical Impression Statement Pt. cont to amb. safely with use of blue rocker AFO and rollator; cont to require short rest breaks due to fatigue   Pt will benefit from skilled therapeutic intervention in order to improve on the following deficits Abnormal gait;Decreased endurance;Decreased coordination;Decreased strength;Decreased balance   Rehab Potential Good   PT Frequency 2x / week   PT Duration 4 weeks   PT Treatment/Interventions Gait training;Therapeutic exercise;Patient/family education;Balance training;Stair training;Functional mobility training;Neuromuscular re-education;Therapeutic activities   PT Next Visit Plan cont gait and balance   PT Home Exercise  Plan added hamstring stretch and walking as much as possible   Consulted and Agree with Plan of Care Patient;Family member/caregiver   PT Plan cont to work on gait and standing balance        Problem List Patient Active Problem List   Diagnosis Date Noted  . Aftercare following surgery of the circulatory system 10/25/2014  . PVD (peripheral vascular disease) 10/25/2014  . Occlusion and stenosis of carotid artery without mention of cerebral infarction 11/16/2013  . Aftercare following surgery of the circulatory system, [place] 10/19/2013  . Peripheral vascular disease, unspecified 10/16/2012  . Venous insufficiency 02/19/2012  . CVA, old, hemiparesis 02/18/2012  . CAD (coronary artery disease) of artery bypass graft 02/18/2012  . Cellulitis 02/17/2012  . HTN (hypertension) 02/17/2012  . 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

## 2015-03-18 ENCOUNTER — Ambulatory Visit (INDEPENDENT_AMBULATORY_CARE_PROVIDER_SITE_OTHER): Payer: Medicare Other | Admitting: Neurology

## 2015-03-18 ENCOUNTER — Encounter: Payer: Self-pay | Admitting: Neurology

## 2015-03-18 VITALS — BP 115/76 | HR 62 | Ht 72.0 in | Wt 261.0 lb

## 2015-03-18 DIAGNOSIS — E1159 Type 2 diabetes mellitus with other circulatory complications: Secondary | ICD-10-CM | POA: Diagnosis not present

## 2015-03-18 DIAGNOSIS — I10 Essential (primary) hypertension: Secondary | ICD-10-CM

## 2015-03-18 DIAGNOSIS — I69359 Hemiplegia and hemiparesis following cerebral infarction affecting unspecified side: Secondary | ICD-10-CM

## 2015-03-18 DIAGNOSIS — I69959 Hemiplegia and hemiparesis following unspecified cerebrovascular disease affecting unspecified side: Secondary | ICD-10-CM | POA: Diagnosis not present

## 2015-03-18 DIAGNOSIS — E1151 Type 2 diabetes mellitus with diabetic peripheral angiopathy without gangrene: Secondary | ICD-10-CM | POA: Diagnosis not present

## 2015-03-18 DIAGNOSIS — E785 Hyperlipidemia, unspecified: Secondary | ICD-10-CM

## 2015-03-18 DIAGNOSIS — E119 Type 2 diabetes mellitus without complications: Secondary | ICD-10-CM | POA: Insufficient documentation

## 2015-03-18 NOTE — Patient Instructions (Addendum)
-   continue ASA and lipitor for stroke prevention - Follow up with your primary care physician for stroke risk factor modification. Recommend maintain blood pressure goal <130/80, diabetes with hemoglobin A1c goal below 6.5% and lipids with LDL cholesterol goal below 70 mg/dL.  - continue PT, OT and exercise - will monitor with carotid doppler and TCD and blood draw for risk factor monitoring - check BP and glucose at home - follow up in 3 months

## 2015-03-18 NOTE — Progress Notes (Signed)
NEUROLOGY CLINIC NEW PATIENT NOTE  NAME: [name] DOB: 10/09/1946  I saw Craig Deed as a new patient in the neurovascular clinic today regarding  Chief Complaint  Patient presents with  . Follow-up    RM 1  . Cerebrovascular Accident  .  HPI: [name] is a 69 y.o. male with PMH of CAD s/p CABG and stenting, DM, HTN, PVD and stroke who presents as a new patient for stroke follow up. He was last seen in the practice on 11/01/2011.  He had CABG in 04/2010. He was admitted on 01/28/11 for severe recurring angina. He was put on IV heparin, aspirin, beta-blocker therapy, and statin therapy.cardiac cath performed showed bypass graft occluded. Drug-eluting stent was placed. Right after the procedure, he had right facial droop and left eye movement difficulty. CT negative. He was put on ASA and plavix. However, his symptoms continued to deteriorate and started to have slurry speech and right arm weakness, then developed to right UE and LE hemiplegia. Repeat CT no bleeding and MRI showed left cerebellar, left pons, right MCA/PCA and right thalamus infarct, consistent with cardioembolic pattern. MRA showed proximal BA severe stenosis. Cerebral angio done showed severe diseased posterior circulation. He was kept on dural antiplatelet for one year due to cardiac stent. He was discharged to SNF.      Since discharge, he was followed by vascular surgery, cardiology and physical medicine for PVD, CAD, and right hemiplegia. He also followed up with Dr. Pearlean Brownie in clinic, last visit 11/01/2011. His CUS in 06/2011 unremarkable. Currently, he was able to walk with walker, however, still has right sided weakness but able to do some ADLs. He continued on regular exercise with bicycle. He stated that he felt some dizziness if he takes both BP meds in the morning. His BP today in clinic 115/76 and he said his glucose in good control.   Past Medical History  Diagnosis Date  . Coronary artery  disease   . Diabetes mellitus   . Stroke feb 2012    complication of cath  . Myocardial infarction 2011  . Gout   . Peripheral vascular disease   . Arthritis     Gout   Past Surgical History  Procedure Laterality Date  . Coronary artery bypass graft    . Femoral bypass Left Sept. 30, 2011    Left Fem to above knee Pop Artery BPG  . Cardiac catheterization      stent done feb 2012  . Vascular surgery     Family History  Problem Relation Age of Onset  . Heart disease Mother   . Heart disease Father   . Diabetes Father   . Heart attack Father    Current Outpatient Prescriptions  Medication Sig Dispense Refill  . allopurinol (ZYLOPRIM) 100 MG tablet Take 100 mg by mouth daily.    Marland Kitchen aspirin 325 MG tablet Take 325 mg by mouth daily.    Marland Kitchen atenolol (TENORMIN) 50 MG tablet Take 0.5 tablets (25 mg total) by mouth 2 (two) times daily. 30 tablet 10  . atorvastatin (LIPITOR) 20 MG tablet Take 20 mg by mouth daily.    Marland Kitchen glipiZIDE (GLUCOTROL) 5 MG tablet Take 2.5 mg by mouth 2 (two) times daily before a meal.     . lisinopril (PRINIVIL,ZESTRIL) 2.5 MG tablet Take 2.5 mg by mouth daily.     . nitroGLYCERIN (NITROSTAT) 0.4 MG SL tablet Place 1 tablet (0.4 mg total) under the tongue every  5 (five) minutes as needed for chest pain. 25 tablet 3  . tiZANidine (ZANAFLEX) 2 MG tablet Take 1 tablet (2 mg total) by mouth 2 (two) times daily before a meal. 60 tablet 5   No current facility-administered medications for this visit.   No Known Allergies History   Social History  . Marital Status: Married    Spouse Name: N/A  . Number of Children: N/A  . Years of Education: N/A   Occupational History  . Not on file.   Social History Main Topics  . Smoking status: Former Smoker -- 40 years  . Smokeless tobacco: Never Used  . Alcohol Use: No  . Drug Use: No  . Sexual Activity: Not on file   Other Topics Concern  . Not on file   Social History Narrative    Review of Systems Full 14  system review of systems performed and notable only for those listed, all others are neg:  Constitutional:   Cardiovascular:  Ear/Nose/Throat:   Skin:  Eyes:   Respiratory:   Gastroitestinal:   Genitourinary:  Hematology/Lymphatic:   Endocrine:  Musculoskeletal:   Allergy/Immunology:   Neurological:  Dizziness Psychiatric:  Sleep:   Physical Exam  Filed Vitals:   03/18/15 1047  BP: 115/76  Pulse: 62    General - Well nourished, well developed, in no apparent distress.  Ophthalmologic - Sharp disc margins OU.  Cardiovascular - Regular rate and rhythm with no murmur. Carotid pulses were 2+ without bruits.   Neck - supple, no nuchal rigidity  Mental Status -  Level of arousal and orientation to time, place, and person were intact. Language including expression, naming, repetition, comprehension was assessed and found intact.  Cranial Nerves II - XII - II - Visual field intact OU. III, IV, VI - Extraocular movements intact. V - Facial sensation intact bilaterally. VII - mild right facial droop. VIII - Hearing & vestibular intact bilaterally. X - Palate elevates symmetrically. XI - Chin turning & shoulder shrug intact bilaterally. XII - Tongue protrusion intact.  Motor Strength - The patient's strength was 4/5 RUE and 4+/5 RLE and pronator drift was present on the right.  Bulk was normal and fasciculations were absent.   Motor Tone - Muscle tone was assessed at the neck and appendages and was normal.  Reflexes - The patient's reflexes were 1+ in all extremities and he had no pathological reflexes.  Sensory - Light touch, temperature/pinprick were assessed and were normal.    Coordination - The patient had normal movements in the hands and feet with no ataxia or dysmetria.  Tremor was absent.  Gait and Station - walk with walker and right hemiparetic gait.   Imaging  I have personally reviewed the radiological images below and agree with the radiology  interpretations.  MRI brain - Acute infarctions scattered throughout the posterior circulation territory as outlined above. The largest infarction effects the left side of the pons and midbrain. Small infarctions are present in the left cerebellum, the right occipital lobe and both thalami. There is a tiny punctate infarction on the right side of the pons as well. These do not show hemorrhage or significant mass Effect/swelling.  MRA head - Atherosclerotic change of the anterior circulation but no flow- limiting stenosis or correctable stenosis. Markedly diseased posterior circulation. Atherosclerotic irregularity and narrowing of both distal vertebral arteries, right worse than left. Distal right vertebral artery stenosis estimated at 50%. Severe stenosis of the proximal basilar artery estimated at 70-90%. Severe disease  affecting the basilar artery terminus region with severe stenosis of the basilar tip and both posterior cerebral arteries. Distal vessel flow in the left posterior cerebral artery is not demonstrated.             Cerebral angio -   1. Large smooth filling defect noted in the distal basilar artery with insinuation into both posterior cerebral arteries, left greater than right, and also the superior cerebellar arteries, left greater than right. 2. Angiographically minimal flow noted in the left P1 segment or the proximal left superior cerebellar artery, with nonobstructive filling defect extending into the right posterior cerebral artery. 3. 50% stenosis at the origin of the dominant left vertebral artery. 4. Approximately 70% stenosis of the right vertebrobasilar junction distal to the origin of the right posterior inferior cerebellar artery. 5. Approximately 50% stenosis of the left internal carotid artery in the caval cavernous segment.  Lab Review Component     Latest Ref Rng 01/29/2011 09/12/2011 02/18/2012  Cholesterol     0 - 200 mg/dL  409149     Triglycerides     <150 mg/dL  99   HDL     >81>39 mg/dL  32 (L)   Total CHOL/HDL Ratio       4.7   VLDL     0 - 40 mg/dL  20   LDL (calc)     0 - 99 mg/dL  97   Hemoglobin [id number]     <5.7 % 6.4 (H) . . .    Mean Plasma Glucose     <117 mg/dL 478137 (H)    TSH     2.9560.350 - 4.500 uIU/mL   1.059     Assessment and Plan:   In summary, [name] is a 69 y.o. male with PMH of CAD s/p CABG and stenting, DM, HTN, PVD had CABG in 04/2010. He was admitted on 01/28/11 for severe recurring angina. Cardiac cath performed showed bypass graft occluded. Drug-eluting stent was placed. Right after the procedure, he had right facial droop and left eye movement difficulty, slurry speech and right arm weakness, then developed to right UE and LE hemiplegia. Repeat CT no bleeding and MRI showed left cerebellar, left pons, right MCA/PCA and right thalamus infarct, consistent with cardioembolic pattern. MRA showed proximal BA severe stenosis. Cerebral angio done showed severe diseased posterior circulation. He was discharged on dural antiplatelet and liptor. His stroke most likely due to plaque dislodge during cardiac cath but other etiology also possible as pt has multiple stroke risk factors. Currently he was doing well, muscle strength much improved. Able to walk with walker.   - continue ASA and lipitor for stroke prevention - Follow up with your primary care physician for stroke risk factor modification. Recommend maintain blood pressure goal <130/80, diabetes with hemoglobin A1c goal below 6.5% and lipids with LDL cholesterol goal below 70 mg/dL.  - will do TCD and CUS and stroke labs for periodic monitoring - continue PT, OT and self exercise - check BP and glucose at home - RTC in 3-4 months.   I recommend aggressive blood pressure control with a goal <130/80 mm Hg.  Lipids should be managed intensively, with a goal LDL < 70 mg/dL.  I encouraged the patient to discuss these important issues with his  primary care physician.  I counseled the patient on measures to reduce stroke risk, including the importance of medication compliance, risk factor control, exercise, healthy diet, and avoidance of smoking.  I reviewed  stroke warning signs and symptoms and appropriate actions to take if such occurs.   Thank you very much for the opportunity to participate in the care of this patient.  Please do not hesitate to call if any questions or concerns arise.  Orders Placed This Encounter  Procedures  . US Carotid Bilateral    Standing Status: Future     Number of Occurrences:      Standing Expiration Date: 05/19/2016    Order Specific Question:  Reason for Exam (SYMPTOM  OR DIAGNOSIS REQUIRED)    Answer:  stroke    Order Specific Question:  Preferred imaging location?    Answer:  Internal  . [place] TCD COMPLETE    Standing Status: Future     Number of Occurrences:      Standing Expiration Date: 05/19/2016    Order Specific Question:  Reason for Exam (SYMPTOM  OR DIAGNOSIS REQUIRED)    Answer:  stroke    Order Specific Question:  Preferred imaging location?    Answer:  Internal  . TSH + free T4  . Lipid panel  . Hemoglobin A1c    No orders of the defined types were placed in this encounter.    Patient Instructions  - continue ASA and lipitor for stroke prevention - Follow up with your primary care physician for stroke risk factor modification. Recommend maintain blood pressure goal <130/80, diabetes with hemoglobin A1c goal below 6.5% and lipids with LDL cholesterol goal below 70 mg/dL.  - continue PT, OT and exercise - will monitor with carotid doppler and TCD and blood draw for risk factor monitoring - check BP and glucose at home - follow up in 3 months   Marvel Plan, MD PhD [facility] [address], [address] [place], [place] [address] [phone]

## 2015-03-19 LAB — LIPID PANEL
CHOL/HDL RATIO: 4.5 ratio (ref 0.0–5.0)
CHOLESTEROL TOTAL: 166 mg/dL (ref 100–199)
HDL: 37 mg/dL — AB (ref >39)
LDL Calculated: 100 mg/dL — ABNORMAL HIGH (ref 0–99)
Triglycerides: 145 mg/dL (ref 0–149)
VLDL Cholesterol Cal: 29 mg/dL (ref 5–40)

## 2015-03-19 LAB — TSH+FREE T4
Free T4: 1.03 ng/dL (ref 0.82–1.77)
TSH: 1.43 u[IU]/mL (ref 0.450–4.500)

## 2015-03-19 LAB — HEMOGLOBIN A1C
Est. average glucose Bld gHb Est-mCnc: 126 mg/dL
HEMOGLOBIN A1C: 6 % — AB (ref 4.8–5.6)

## 2015-03-22 ENCOUNTER — Other Ambulatory Visit: Payer: Self-pay | Admitting: Neurology

## 2015-03-22 DIAGNOSIS — I69359 Hemiplegia and hemiparesis following cerebral infarction affecting unspecified side: Secondary | ICD-10-CM

## 2015-03-22 MED ORDER — ATORVASTATIN CALCIUM 40 MG PO TABS: 40.0000 mg | ORAL_TABLET | Freq: Every day | ORAL | Status: DC

## 2015-03-29 ENCOUNTER — Ambulatory Visit: Payer: Medicare Other | Attending: Physical Medicine & Rehabilitation | Admitting: Physical Therapy

## 2015-03-29 DIAGNOSIS — I69354 Hemiplegia and hemiparesis following cerebral infarction affecting left non-dominant side: Secondary | ICD-10-CM | POA: Insufficient documentation

## 2015-03-29 DIAGNOSIS — M6281 Muscle weakness (generalized): Secondary | ICD-10-CM | POA: Diagnosis not present

## 2015-03-29 DIAGNOSIS — R269 Unspecified abnormalities of gait and mobility: Secondary | ICD-10-CM | POA: Insufficient documentation

## 2015-03-29 DIAGNOSIS — Z5189 Encounter for other specified aftercare: Secondary | ICD-10-CM | POA: Diagnosis not present

## 2015-03-29 DIAGNOSIS — Z951 Presence of aortocoronary bypass graft: Secondary | ICD-10-CM | POA: Diagnosis not present

## 2015-03-29 DIAGNOSIS — I252 Old myocardial infarction: Secondary | ICD-10-CM | POA: Insufficient documentation

## 2015-03-29 DIAGNOSIS — G8191 Hemiplegia, unspecified affecting right dominant side: Secondary | ICD-10-CM

## 2015-03-30 ENCOUNTER — Encounter: Payer: Self-pay | Admitting: Physical Therapy

## 2015-03-30 NOTE — Therapy (Signed)
[facility] [address] [place], [place], [address] Phone: [phone]   Fax:  [phone]  Physical Therapy Treatment  Patient Details  Name: [name] MRN: [id number] Date of Birth: 07-09-1946 Referring Provider:  Wenda Low, MD  Encounter Date: 03/29/2015      PT End of Session - 03/30/15 1041    Visit Number 33  G3   Number of Visits 35   Date for PT Re-Evaluation 04/08/15   Authorization Type Medicare   Authorization Time Period 03-09-15 - 04-23-15   PT Start Time 1017   PT Stop Time 1103   PT Time Calculation (min) 46 min      Past Medical History  Diagnosis Date  . Coronary artery disease   . Diabetes mellitus   . Stroke feb 2012    complication of cath  . Myocardial infarction 2011  . Gout   . Peripheral vascular disease   . Arthritis     Gout    Past Surgical History  Procedure Laterality Date  . Coronary artery bypass graft    . Femoral bypass Left Sept. 30, 2011    Left Fem to above knee Pop Artery BPG  . Cardiac catheterization      stent done feb 2012  . Vascular surgery      There were no vitals filed for this visit.  Visit Diagnosis:  Abnormality of gait  Hemiplegia affecting right dominant side      Subjective Assessment - 03/30/15 1039    Subjective Pt. reports he continues to feel some tightness in right low back but is better than it was initially   Patient is accompained by: Family member   Pertinent History s/p old L CVA with R hemiparesis   Patient Stated Goals Improve balance and walking   Currently in Pain? No/denies                       [facility] Adult PT Treatment/Exercise - 03/30/15 0001    Ambulation/Gait   Ambulation/Gait Yes   Ambulation/Gait Assistance Other (comment)  CGA to SBA   Ambulation Distance (Feet) 120 Feet   Assistive device 4-wheeled walker   Gait Pattern Decreased hip/knee flexion - right;Decreased step length -  right;Step-through pattern   Gait Comments Pt. gait trained inside bars without UE support with SBA with cues for posture and step length - 10' x 6 reps     TherEx:  Blue theraband used for right hip resisted abduction x 10 reps in hooklying position; SLR RLE with band x 10 reps; R hip flexion in hooklying x 10 reps with 3 secs hold; right knee flexion seated position x 10 reps with band R heel cord stretch x 30 sec hold in standing; R piriformis stretch x 30 sec hold in supine x 3 reps R trunk rotation stretch x 2 reps 30 sec hold            PT Short Term Goals - 02/08/15 1251    Additional Short Term Goals   Additional Short Term Goals Yes           PT Long Term Goals - 03/10/15 1614    PT LONG TERM GOAL #1   Title Amb. 250' with RW with SBA   Status Achieved   PT LONG TERM GOAL #2   Title Incr. gait velocity to >/= 1.4 ft/sec with RW   Status On-going   PT LONG TERM GOAL #3  Title Independent in updated HEP   Status Achieved   PT LONG TERM GOAL #4   Title Amb. 350' with new AFO on RLE with RW with S.   Baseline met 03-09-15   Status Achieved   PT LONG TERM GOAL #5   Status On-going   PT LONG TERM GOAL #6   Title Negotiate steps with 2 rails with pt. reporting incr. R hip flexion during ascension with step negotiation at home   Baseline met 03-09-15   Status Achieved               Plan - 03/30/15 1042    Clinical Impression Statement Pt. demonstrates incr. flexibilty and less tightness in right lateral trunk and low back region   Pt will benefit from skilled therapeutic intervention in order to improve on the following deficits Abnormal gait;Decreased endurance;Decreased coordination;Decreased strength;Decreased balance   Rehab Potential Good   PT Frequency 2x / week   PT Duration 4 weeks   PT Treatment/Interventions Gait training;Therapeutic exercise;Patient/family education;Balance training;Stair training;Functional mobility training;Neuromuscular  re-education;Therapeutic activities   PT Next Visit Plan prioriize exercises for HEP - blue band given   PT Home Exercise Plan added hamstring stretch and walking as much as possible   Consulted and Agree with Plan of Care Family member/caregiver   PT Plan cont to work on gait and standing balance        Problem List Patient Active Problem List   Diagnosis Date Noted  . DM (diabetes mellitus) 03/18/2015  . Essential hypertension 03/18/2015  . HLD (hyperlipidemia) 03/18/2015  . Aftercare following surgery of the circulatory system 10/25/2014  . PVD (peripheral vascular disease) 10/25/2014  . Occlusion and stenosis of carotid artery without mention of cerebral infarction 11/16/2013  . Aftercare following surgery of the circulatory system, [place] 10/19/2013  . Peripheral vascular disease, unspecified 10/16/2012  . Venous insufficiency 02/19/2012  . CVA, old, hemiparesis 02/18/2012  . CAD (coronary artery disease) of artery bypass graft 02/18/2012  . Cellulitis 02/17/2012  . HTN (hypertension) 02/17/2012  . 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

## 2015-03-31 ENCOUNTER — Ambulatory Visit: Payer: Medicare Other | Admitting: Physical Therapy

## 2015-03-31 ENCOUNTER — Encounter: Payer: Self-pay | Admitting: Physical Therapy

## 2015-03-31 DIAGNOSIS — I252 Old myocardial infarction: Secondary | ICD-10-CM | POA: Diagnosis not present

## 2015-03-31 DIAGNOSIS — G8191 Hemiplegia, unspecified affecting right dominant side: Secondary | ICD-10-CM

## 2015-03-31 DIAGNOSIS — R269 Unspecified abnormalities of gait and mobility: Secondary | ICD-10-CM | POA: Diagnosis not present

## 2015-03-31 DIAGNOSIS — Z951 Presence of aortocoronary bypass graft: Secondary | ICD-10-CM | POA: Diagnosis not present

## 2015-03-31 DIAGNOSIS — I69354 Hemiplegia and hemiparesis following cerebral infarction affecting left non-dominant side: Secondary | ICD-10-CM | POA: Diagnosis not present

## 2015-03-31 DIAGNOSIS — Z5189 Encounter for other specified aftercare: Secondary | ICD-10-CM | POA: Diagnosis not present

## 2015-03-31 DIAGNOSIS — M6281 Muscle weakness (generalized): Secondary | ICD-10-CM | POA: Diagnosis not present

## 2015-03-31 NOTE — Therapy (Signed)
[facility] [address] [place], [place], [address] Phone: [phone]   Fax:  [phone]  Physical Therapy Treatment  Patient Details  Name: [name] MRN: [id number] Date of Birth: 1946-12-18 Referring Provider:  Wenda Low, MD  Encounter Date: 03/31/2015      PT End of Session - 03/31/15 1138    Visit Number 34   Number of Visits 35   Date for PT Re-Evaluation 04/08/15   Authorization Type Medicare   Authorization Time Period 03-09-15 - 04-23-15   PT Start Time 1022   PT Stop Time 1100   PT Time Calculation (min) 38 min      Past Medical History  Diagnosis Date  . Coronary artery disease   . Diabetes mellitus   . Stroke feb 2012    complication of cath  . Myocardial infarction 2011  . Gout   . Peripheral vascular disease   . Arthritis     Gout    Past Surgical History  Procedure Laterality Date  . Coronary artery bypass graft    . Femoral bypass Left Sept. 30, 2011    Left Fem to above knee Pop Artery BPG  . Cardiac catheterization      stent done feb 2012  . Vascular surgery      There were no vitals filed for this visit.  Visit Diagnosis:  Hemiplegia affecting right dominant side      Subjective Assessment - 03/31/15 1118    Patient Stated Goals Improve balance and walking   Currently in Pain? No/denies                       [facility] Adult PT Treatment/Exercise - 03/31/15 1035    Ambulation/Gait   Ambulation/Gait Yes   Ambulation/Gait Assistance 5: Supervision  10' X 2 and sidestepping- limited by LBP   Ambulation Distance (Feet) --  10' x 2 forward and sidestepping- limited due to LBP   Exercises   Exercises Ankle   Lumbar Exercises: Stretches   Lower Trunk Rotation 30 seconds   Lower Trunk Rotation Limitations --  both directions   Piriformis Stretch 30 seconds   Piriformis Stretch Limitations --  with ball   Lumbar Exercises: Seated   Other Seated  Lumbar Exercises rolling swiss ball forward for lumbar stretch 30 secs hold   Lumbar Exercises: Supine   Clam 10 reps   Clam Limitations --  modified to supine for ease, with blue t-band   Straight Leg Raise 10 reps   Straight Leg Raises Limitations --  blue tband   Other Supine Lumbar Exercises --  knee to chest then knee ext vs. blue tband   Knee/Hip Exercises: Stretches   Piriformis Stretch 30 seconds   Piriformis Stretch Limitations --  with ball under opposite leg   Knee/Hip Exercises: Seated   Other Seated Knee Exercises right kneed flex seated vs. blue theraband x 10   Ankle Exercises: Stretches   Gastroc Stretch Limitations --  as in HEP, x 10 sec.  Limited by AFO                PT Education - 03/31/15 1043    Education provided Yes   Education Details blue tband given last visit   Person(s) Educated Patient   Methods Explanation;Demonstration;Verbal cues;Handout;Tactile cues   Comprehension Verbalized understanding;Returned demonstration          PT Short Term Goals - 02/08/15 1251  Additional Short Term Goals   Additional Short Term Goals Yes           PT Long Term Goals - 03/10/15 1614    PT LONG TERM GOAL #1   Title Amb. 250' with RW with SBA   Status Achieved   PT LONG TERM GOAL #2   Title Incr. gait velocity to >/= 1.4 ft/sec with RW   Status On-going   PT LONG TERM GOAL #3   Title Independent in updated HEP   Status Achieved   PT LONG TERM GOAL #4   Title Amb. 350' with new AFO on RLE with RW with S.   Baseline met 03-09-15   Status Achieved   PT LONG TERM GOAL #5   Status On-going   PT LONG TERM GOAL #6   Title Negotiate steps with 2 rails with pt. reporting incr. R hip flexion during ascension with step negotiation at home   Baseline met 03-09-15   Status Achieved               Plan - 03/31/15 1130    Clinical Impression Statement Pt.'s gait is limited b low back pain when ambulating without UE support or with 1 UE  support; plateauing in maximizing functional progress   Pt will benefit from skilled therapeutic intervention in order to improve on the following deficits Abnormal gait;Decreased endurance;Decreased coordination;Decreased strength;Decreased balance   Rehab Potential Good   PT Frequency 2x / week   PT Duration 4 weeks   PT Treatment/Interventions Gait training;Therapeutic exercise;Patient/family education;Balance training;Stair training;Functional mobility training;Neuromuscular re-education;Therapeutic activities   PT Next Visit Plan check goals - plan D/C next week   Consulted and Agree with Plan of Care Family member/caregiver   Family Member Consulted spouse   PT Plan cont to work on gait and standing balance        Problem List Patient Active Problem List   Diagnosis Date Noted  . DM (diabetes mellitus) 03/18/2015  . Essential hypertension 03/18/2015  . HLD (hyperlipidemia) 03/18/2015  . Aftercare following surgery of the circulatory system 10/25/2014  . PVD (peripheral vascular disease) 10/25/2014  . Occlusion and stenosis of carotid artery without mention of cerebral infarction 11/16/2013  . Aftercare following surgery of the circulatory system, [place] 10/19/2013  . Peripheral vascular disease, unspecified 10/16/2012  . Venous insufficiency 02/19/2012  . CVA, old, hemiparesis 02/18/2012  . CAD (coronary artery disease) of artery bypass graft 02/18/2012  . Cellulitis 02/17/2012  . HTN (hypertension) 02/17/2012  . 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 [place], [place], [address] Phone: [phone]   Fax:  [phone]

## 2015-03-31 NOTE — Patient Instructions (Addendum)
Hip Abduction: Side-Lying (Single Leg)   Lie on side with knees bent, tubing around thighs just above knees. Raise top leg, keeping knee bent. Repeat _10-15 times per set. Repeat on other side. Do _1_ sets per session. Do __1 sessions per week. Hold for 3-5 seconds http://tub.exer.us/43    Copyright  VHI. All rights reserved.  Advanced Straight Leg Raise  1. Right leg bent, lift against band above knees. With knees bent and feet __10__ inches from floor, slowly straighten right leg, keeping stomach tight. Repeat _10___ times per set. Do ___2_ sets per session. Do _2___ sessions per day.  http://orth.exer.us/1108   Copyright  VHI. All rights reserved.  Hip Flexion - Supine  Band above knees.  Knee to chest then extend or straighten leg and lower. Lying on back, knees bent, feet on floor, bend hips, bringing knees toward trunk. Repeat _10__ times. Do _2__ times per day.  Copyright  VHI. All rights reserved.  Gluteal Stretch   Lie supine, one heel on ball, other leg crossed over knee. Draw ball toward body. Hold ___30 seconds. Do __1_ sets of __3_ repetitions.  Copyright  VHI. All rights reserved.  Gluteal Stretch   Lie supine, one heel on ball, other leg crossed over knee. Draw ball toward body. Hold ___ seconds. Do ___ sets of ___ repetitions.  Copyright  VHI. All rights reserved.  Knee Flexion: Sitting (Single Leg)   Sit facing anchor, leg extended. Tubing looped around ankle, flex knee, pulling back. Repeat 10__ times per set. Repeat with other leg. Do _2_ sets per session. Do 2 x per day Anchor Height: Knee  http://tub.exer.us/53   Copyright  VHI. All rights reserved.  Knee Flexion: Sitting (Single Leg)   Sit facing anchor, leg extended. Tubing looped around ankle, flex knee, pulling back. Repeat __ times per set. Repeat with other leg. Do __ sets per session. Do __ sessions per week. Anchor Height: Knee  http://tub.exer.us/53   Copyright  VHI. All  rights reserved.  Lower Trunk Rotation Stretch   Keeping back flat and feet together, rotate knees to left side. Hold __30-45__ seconds. Repeat ___2_ times per set. Do ___1_ sets per session. Do ___2_ sessions per day.  http://orth.exer.us/122   Copyright  VHI. All rights reserved.  Lower Trunk Rotation Stretch   Keeping back flat and feet together, rotate knees to left side. Hold ____ seconds. Repeat ____ times per set. Do ____ sets per session. Do ____ sessions per day.  http://orth.exer.us/122   Copyright  VHI. All rights reserved.  Advanced Straight Leg Raise   With knees bent and feet ____ inches from floor, slowly straighten right leg, keeping stomach tight. Repeat ____ times per set. Do ____ sets per session. Do ____ sessions per day.  http://orth.exer.us/1108   Copyright  VHI. All rights reserved.  Gastroc / Heel Cord Stretch - On Step   Stand with heels over edge of stair. Holding rail, lower heels until stretch is felt in calf of legs.  Hold 30 seconds Repeat __1_ times. Do _3__ times per day.  Copyright  VHI. All rights reserved.  Gastroc / Heel Cord Stretch - On Step   Stand with heels over edge of stair. Holding rail, lower heels until stretch is felt in calf of legs.  Hold 30 seconds Repeat ___1 times. Do ___3 times per day.  Copyright  VHI. All rights reserved.

## 2015-04-02 ENCOUNTER — Other Ambulatory Visit: Payer: Self-pay | Admitting: Physical Medicine & Rehabilitation

## 2015-04-05 ENCOUNTER — Ambulatory Visit: Payer: Medicare Other | Admitting: Physical Therapy

## 2015-04-05 DIAGNOSIS — Z5189 Encounter for other specified aftercare: Secondary | ICD-10-CM | POA: Diagnosis not present

## 2015-04-05 DIAGNOSIS — R269 Unspecified abnormalities of gait and mobility: Secondary | ICD-10-CM

## 2015-04-05 DIAGNOSIS — G8191 Hemiplegia, unspecified affecting right dominant side: Secondary | ICD-10-CM

## 2015-04-05 DIAGNOSIS — I252 Old myocardial infarction: Secondary | ICD-10-CM | POA: Diagnosis not present

## 2015-04-05 DIAGNOSIS — I69354 Hemiplegia and hemiparesis following cerebral infarction affecting left non-dominant side: Secondary | ICD-10-CM | POA: Diagnosis not present

## 2015-04-05 DIAGNOSIS — M6281 Muscle weakness (generalized): Secondary | ICD-10-CM | POA: Diagnosis not present

## 2015-04-05 DIAGNOSIS — Z951 Presence of aortocoronary bypass graft: Secondary | ICD-10-CM | POA: Diagnosis not present

## 2015-04-06 ENCOUNTER — Encounter: Payer: Self-pay | Admitting: Physical Therapy

## 2015-04-06 NOTE — Therapy (Signed)
[facility] [address] [place], [place], [address] Phone: [phone]   Fax:  [phone]  Physical Therapy Treatment  Patient Details  Name: [name] MRN: [id number] Date of Birth: [birth year] Referring Provider:  Wenda Low, MD  Encounter Date: 04/05/2015      PT End of Session - 04/06/15 0933    Visit Number 35  G5   Number of Visits 35   Date for PT Re-Evaluation 04/08/15   Authorization Type Medicare   Authorization Time Period 03-09-15 - 04-23-15   PT Start Time 1019   PT Stop Time 1102   PT Time Calculation (min) 43 min      Past Medical History  Diagnosis Date  . Coronary artery disease   . Diabetes mellitus   . Stroke feb 2012    complication of cath  . Myocardial infarction 2011  . Gout   . Peripheral vascular disease   . Arthritis     Gout    Past Surgical History  Procedure Laterality Date  . Coronary artery bypass graft    . Femoral bypass Left Sept. 30, 2011    Left Fem to above knee Pop Artery BPG  . Cardiac catheterization      stent done feb 2012  . Vascular surgery      There were no vitals filed for this visit.  Visit Diagnosis:  Abnormality of gait  Hemiplegia affecting right dominant side      Subjective Assessment - 04/06/15 0928    Subjective Pt reports no back pain - states stretches have really helped   Patient is accompained by: Family member   Pertinent History s/p old L CVA with R hemiparesis   Patient Stated Goals Improve balance and walking                       [facility] Adult PT Treatment/Exercise - 04/06/15 0001    Ambulation/Gait   Ambulation/Gait Yes   Ambulation/Gait Assistance 5: Supervision  10' X 2 and sidestepping- limited by LBP   Ambulation Distance (Feet) 100 Feet   Assistive device 4-wheeled walker   Gait Pattern Decreased hip/knee flexion - right;Decreased step length - right;Step-through pattern   Exercises   Exercises Ankle   Lumbar Exercises: Stretches   Passive Hamstring Stretch 2 reps;30 seconds   Single Knee to Chest Stretch 2 reps;20 seconds   Lower Trunk Rotation 30 seconds   Piriformis Stretch 30 seconds   Lumbar Exercises: Machines for Strengthening   Leg Press 85# bil. 20 reps; RLE only 55# 20 reps   Lumbar Exercises: Supine   Clam 10 reps   Heel Slides 10 reps   Bent Knee Raise 10 reps  on and off mat table    Bridge 10 reps   Straight Leg Raise 10 reps                  PT Short Term Goals - 02/08/15 1251    Additional Short Term Goals   Additional Short Term Goals Yes           PT Long Term Goals - 03/10/15 1614    PT LONG TERM GOAL #1   Title Amb. 250' with RW with SBA   Status Achieved   PT LONG TERM GOAL #2   Title Incr. gait velocity to >/= 1.4 ft/sec with RW   Status On-going   PT LONG TERM GOAL #3   Title Independent in updated  HEP   Status Achieved   PT LONG TERM GOAL #4   Title Amb. 350' with new AFO on RLE with RW with S.   Baseline met 03-09-15   Status Achieved   PT LONG TERM GOAL #5   Status On-going   PT LONG TERM GOAL #6   Title Negotiate steps with 2 rails with pt. reporting incr. R hip flexion during ascension with step negotiation at home   Baseline met 03-09-15   Status Achieved               Plan - 04/06/15 0933    Clinical Impression Statement Pt. progressing towards LTG's - plan D/C next session   Pt will benefit from skilled therapeutic intervention in order to improve on the following deficits Abnormal gait;Decreased endurance;Decreased coordination;Decreased strength;Decreased balance   Rehab Potential Good   PT Frequency 2x / week   PT Duration 4 weeks   PT Treatment/Interventions Gait training;Therapeutic exercise;Patient/family education;Balance training;Stair training;Functional mobility training;Neuromuscular re-education;Therapeutic activities   PT Next Visit Plan check goals - plan D/C next week   PT Home  Exercise Plan added hamstring stretch and walking as much as possible   Consulted and Agree with Plan of Care Family member/caregiver   Family Member Consulted spouse        Problem List Patient Active Problem List   Diagnosis Date Noted  . DM (diabetes mellitus) 03/18/2015  . Essential hypertension 03/18/2015  . HLD (hyperlipidemia) 03/18/2015  . Aftercare following surgery of the circulatory system 10/25/2014  . PVD (peripheral vascular disease) 10/25/2014  . Occlusion and stenosis of carotid artery without mention of cerebral infarction 11/16/2013  . Aftercare following surgery of the circulatory system, [place] 10/19/2013  . Peripheral vascular disease, unspecified 10/16/2012  . Venous insufficiency 02/19/2012  . CVA, old, hemiparesis 02/18/2012  . CAD (coronary artery disease) of artery bypass graft 02/18/2012  . Cellulitis 02/17/2012  . HTN (hypertension) 02/17/2012  . 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 [place], [place], [address] Phone: [phone]   Fax:  [phone]

## 2015-04-07 ENCOUNTER — Encounter: Payer: Self-pay | Admitting: Physical Therapy

## 2015-04-07 ENCOUNTER — Ambulatory Visit: Payer: Medicare Other | Admitting: Physical Therapy

## 2015-04-07 DIAGNOSIS — Z951 Presence of aortocoronary bypass graft: Secondary | ICD-10-CM | POA: Diagnosis not present

## 2015-04-07 DIAGNOSIS — G8191 Hemiplegia, unspecified affecting right dominant side: Secondary | ICD-10-CM

## 2015-04-07 DIAGNOSIS — Z5189 Encounter for other specified aftercare: Secondary | ICD-10-CM | POA: Diagnosis not present

## 2015-04-07 DIAGNOSIS — R269 Unspecified abnormalities of gait and mobility: Secondary | ICD-10-CM

## 2015-04-07 DIAGNOSIS — I69354 Hemiplegia and hemiparesis following cerebral infarction affecting left non-dominant side: Secondary | ICD-10-CM | POA: Diagnosis not present

## 2015-04-07 DIAGNOSIS — I252 Old myocardial infarction: Secondary | ICD-10-CM | POA: Diagnosis not present

## 2015-04-07 DIAGNOSIS — M6281 Muscle weakness (generalized): Secondary | ICD-10-CM | POA: Diagnosis not present

## 2015-04-07 NOTE — Therapy (Signed)
[facility] [address] [place], [place], [address] Phone: [phone]   Fax:  [phone]  Physical Therapy Treatment  Patient Details  Name: [name] MRN: [id number] Date of Birth: 1946/09/01 Referring Provider:  Wenda Low, MD  Encounter Date: 04/07/2015      PT End of Session - 04/07/15 1735    Visit Number 36   Number of Visits 35   Date for PT Re-Evaluation 04/08/15   Authorization Type Medicare   Authorization Time Period 03-09-15 - 04-23-15   PT Start Time 1018   PT Stop Time 1103   PT Time Calculation (min) 45 min      Past Medical History  Diagnosis Date  . Coronary artery disease   . Diabetes mellitus   . Stroke feb 2012    complication of cath  . Myocardial infarction 2011  . Gout   . Peripheral vascular disease   . Arthritis     Gout    Past Surgical History  Procedure Laterality Date  . Coronary artery bypass graft    . Femoral bypass Left Sept. 30, 2011    Left Fem to above knee Pop Artery BPG  . Cardiac catheterization      stent done feb 2012  . Vascular surgery      There were no vitals filed for this visit.  Visit Diagnosis:  Abnormality of gait  Hemiplegia affecting right dominant side      Subjective Assessment - 04/07/15 1725    Subjective Pt. reports he is going to join [employer]. center; continues to do some exercises at home - doing stretches which is helping back pain    Patient is accompained by: Family member   Pertinent History s/p old L CVA with R hemiparesis   Patient Stated Goals Improve balance and walking   Currently in Pain? No/denies                       [facility] Adult PT Treatment/Exercise - 04/07/15 0001    Ambulation/Gait   Ambulation/Gait Yes   Ambulation/Gait Assistance 5: Supervision  10' X 2 and sidestepping- limited by LBP   Ambulation Distance (Feet) 360 Feet   Assistive device 4-wheeled walker   Gait Pattern  Decreased hip/knee flexion - right;Decreased step length - right;Step-through pattern   Gait Comments Pt. gait trained inside bars without UE support with SBA with cues for posture and step length - 10' x 6 reps   Exercises   Exercises Ankle   Lumbar Exercises: Stretches   Passive Hamstring Stretch 2 reps;30 seconds   Single Knee to Chest Stretch 2 reps;20 seconds   Lower Trunk Rotation 30 seconds   Lower Trunk Rotation Limitations --  both directions   Piriformis Stretch 30 seconds   Lumbar Exercises: Supine   Heel Slides 10 reps   Bent Knee Raise 10 reps  with blue theraband x 10 reps   Bridge 10 reps   Straight Leg Raise 10 reps   Knee/Hip Exercises: Stretches   Piriformis Stretch 30 seconds     Gait velocity; 30.22 secs --  32.8/30.22 secs = 1.09 ft/sec             PT Short Term Goals - 02/08/15 1251    Additional Short Term Goals   Additional Short Term Goals Yes           PT Long Term Goals - 04/07/15 1736    PT LONG  TERM GOAL #1   Title Amb. 250' with RW with SBA   Status Achieved   PT LONG TERM GOAL #2   Title Incr. gait velocity to >/= 1.4 ft/sec with RW   Baseline 1.08 ft/sec with RW  (30.22 secs)  - tested on 2015-04-26   Status Not Met   PT LONG TERM GOAL #3   Title Independent in updated HEP   Baseline met Apr 26, 2015   Status Achieved   PT LONG TERM GOAL #4   Title Amb. 350' with new AFO on RLE with RW with S.   Baseline met 2015/04/26   Status Achieved   PT LONG TERM GOAL #5   Title Increase gait velocity to >/= 1.4 ft/sec with RW   Baseline not met - 1.08 ft/sec with rollator   Status Not Met   PT LONG TERM GOAL #6   Title Negotiate steps with 2 rails with pt. reporting incr. R hip flexion during ascension with step negotiation at home   Baseline met 2015-04-26   Status Achieved                 G-Codes - 04/26/15 1747    Functional Assessment Tool Used gait velocity 1.09 ft/sec with RW; pt. requires use of rollator for assistance with  ambulation   Functional Limitation Mobility: Walking and moving around   Mobility: Walking and Moving Around Goal Status [phone]) At least 40 percent but less than 60 percent impaired, limited or restricted   Mobility: Walking and Moving Around Discharge Status [phone]) At least 40 percent but less than 60 percent impaired, limited or restricted      Problem List Patient Active Problem List   Diagnosis Date Noted  . DM (diabetes mellitus) 03/18/2015  . Essential hypertension 03/18/2015  . HLD (hyperlipidemia) 03/18/2015  . Aftercare following surgery of the circulatory system 10/25/2014  . PVD (peripheral vascular disease) 10/25/2014  . Occlusion and stenosis of carotid artery without mention of cerebral infarction 11/16/2013  . Aftercare following surgery of the circulatory system, [place] 10/19/2013  . Peripheral vascular disease, unspecified 10/16/2012  . Venous insufficiency 02/19/2012  . CVA, old, hemiparesis 02/18/2012  . CAD (coronary artery disease) of artery bypass graft 02/18/2012  . Cellulitis 02/17/2012  . HTN (hypertension) 02/17/2012  . 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 Functional use of RLE with pt. Wearing blue rocker AFO on RLE and using rollator Continued decr. Standing balance   Education / Equipment: Pt. Has been instructed in a HEP for RLE strengthening and stretching and balance exercises.  Plan: Patient agrees to discharge.  Patient goals were met. Patient is being discharged due to meeting the stated rehab goals.  ?????  Pt has reached maximum functional potential at this time.     Alda Lea, PT 2015/04/26, 5:54 PM  [facility] [address] [place], [place], [address] Phone: [phone]   Fax:  [phone]

## 2015-04-14 ENCOUNTER — Other Ambulatory Visit: Payer: Self-pay | Admitting: Physical Medicine & Rehabilitation

## 2015-04-20 ENCOUNTER — Ambulatory Visit (INDEPENDENT_AMBULATORY_CARE_PROVIDER_SITE_OTHER): Payer: Medicare Other

## 2015-04-20 DIAGNOSIS — I69359 Hemiplegia and hemiparesis following cerebral infarction affecting unspecified side: Secondary | ICD-10-CM

## 2015-04-20 DIAGNOSIS — Z0289 Encounter for other administrative examinations: Secondary | ICD-10-CM

## 2015-06-07 ENCOUNTER — Ambulatory Visit: Payer: Medicare Other | Admitting: Physical Medicine & Rehabilitation

## 2015-06-07 ENCOUNTER — Encounter: Payer: Medicare Other | Attending: Physical Medicine & Rehabilitation

## 2015-06-07 DIAGNOSIS — I69359 Hemiplegia and hemiparesis following cerebral infarction affecting unspecified side: Secondary | ICD-10-CM | POA: Insufficient documentation

## 2015-06-13 ENCOUNTER — Encounter: Payer: Self-pay | Admitting: Physical Medicine & Rehabilitation

## 2015-06-13 ENCOUNTER — Ambulatory Visit (HOSPITAL_BASED_OUTPATIENT_CLINIC_OR_DEPARTMENT_OTHER): Payer: Medicare Other | Admitting: Physical Medicine & Rehabilitation

## 2015-06-13 VITALS — BP 116/74 | HR 60 | Resp 16

## 2015-06-13 DIAGNOSIS — I69359 Hemiplegia and hemiparesis following cerebral infarction affecting unspecified side: Secondary | ICD-10-CM

## 2015-06-13 NOTE — Patient Instructions (Signed)
Straight leg raising and Knee extension exercise with ankle weight

## 2015-06-13 NOTE — Progress Notes (Signed)
Subjective:    Patient ID: [name], male    DOB: [birth year], 69 y.o.   MRN: [id number]  HPI  Chief complaint is knee buckling  No falls with his knee buckling, he uses a right AFO which is new compared to last visit. Asking about fish oil supplement  Finished therapy in April 2016, still doing HEP, Sationary bike after breakfast  Pain Inventory Average Pain 0 Pain Right Now 0 My pain is intermittent and dull  In the last 24 hours, has pain interfered with the following? General activity 0 Relation with others 0 Enjoyment of life 0 What TIME of day is your pain at its worst? no pain Sleep (in general) Good  Pain is worse with: inactivity and some activites Pain improves with: rest Relief from Meds: 0  Mobility walk without assistance use a walker how many minutes can you walk? 5-10 ability to climb steps?  no do you drive?  no  Function retired  Neuro/Psych trouble walking  Prior Studies Any changes since last visit?  no  Physicians involved in your care Any changes since last visit?  no   Family History  Problem Relation Age of Onset  . Heart disease Mother   . Heart disease Father   . Diabetes Father   . Heart attack Father    History   Social History  . Marital Status: Married    Spouse Name: N/A  . Number of Children: N/A  . Years of Education: N/A   Social History Main Topics  . Smoking status: Former Smoker -- 40 years  . Smokeless tobacco: Never Used  . Alcohol Use: No  . Drug Use: No  . Sexual Activity: Not on file   Other Topics Concern  . None   Social History Narrative   Past Surgical History  Procedure Laterality Date  . Coronary artery bypass graft    . Femoral bypass Left Sept. 30, 2011    Left Fem to above knee Pop Artery BPG  . Cardiac catheterization      stent done feb 2012  . Vascular surgery     Past Medical History  Diagnosis Date  . Coronary artery disease   . Diabetes mellitus   . Stroke feb  2012    complication of cath  . Myocardial infarction 2011  . Gout   . Peripheral vascular disease   . Arthritis     Gout   BP 116/74 mmHg  Pulse 60  Resp 16  SpO2 99%  Opioid Risk Score:   Fall Risk Score: Low Fall Risk (0-5 points)`1  Depression screen PHQ 2/9  No flowsheet data found.   Review of Systems  Constitutional: Negative.   HENT: Negative.   Eyes: Negative.   Respiratory: Negative.   Cardiovascular: Negative.   Gastrointestinal: Negative.   Endocrine: Negative.   Genitourinary: Negative.   Musculoskeletal: Negative.   Skin: Negative.   Allergic/Immunologic: Negative.   Neurological:       Trouble walking  Hematological: Negative.   Psychiatric/Behavioral: The patient is nervous/anxious.        Objective:   Physical Exam  Constitutional: He is oriented to person, place, and time. He appears well-developed and well-nourished.  HENT:  Head: Normocephalic and atraumatic.  Eyes: Conjunctivae and EOM are normal. Pupils are equal, round, and reactive to light.  Neurological: He is alert and oriented to person, place, and time. He displays no atrophy. He exhibits abnormal muscle tone.  Reflex Scores:  Tricep reflexes are 3+ on the right side and 2+ on the left side.      Bicep reflexes are 3+ on the right side and 2+ on the left side.      Brachioradialis reflexes are 3+ on the right side and 2+ on the left side.      Patellar reflexes are 3+ on the right side. Motor strength is 4/5 in the  Right deltoid, biceps, triceps, grip, hip flexor, knee extensor are 4 minus Ankle dorsiflexion to minus  Sided straight is 5/5  Psychiatric: He has a normal mood and affect.  Nursing note and vitals reviewed.         Assessment & Plan:  1. Left pontine infarct Right hemiparesis,Knee buckling is related to quad weakness. He may be able to improve quad strength with more concentrated focus on quad exercises this would include using ankle weights for straight  leg raise as well as knee extensor exercises. I discussed this with the patient and his wife and they're willing to give this a tryWould continue Zanaflex for spasticity. Still has quite a bit of clonus at the ankle and the finger flexors   Return to clinic 6 months

## 2015-07-17 ENCOUNTER — Other Ambulatory Visit: Payer: Self-pay | Admitting: Physical Medicine & Rehabilitation

## 2015-07-19 ENCOUNTER — Other Ambulatory Visit: Payer: Self-pay | Admitting: Physical Medicine & Rehabilitation

## 2015-09-23 DIAGNOSIS — H2512 Age-related nuclear cataract, left eye: Secondary | ICD-10-CM | POA: Diagnosis not present

## 2015-09-23 DIAGNOSIS — Z961 Presence of intraocular lens: Secondary | ICD-10-CM | POA: Diagnosis not present

## 2015-09-23 DIAGNOSIS — H25012 Cortical age-related cataract, left eye: Secondary | ICD-10-CM | POA: Diagnosis not present

## 2015-09-23 DIAGNOSIS — E119 Type 2 diabetes mellitus without complications: Secondary | ICD-10-CM | POA: Diagnosis not present

## 2015-10-25 ENCOUNTER — Other Ambulatory Visit: Payer: Self-pay | Admitting: *Deleted

## 2015-10-25 DIAGNOSIS — Z48812 Encounter for surgical aftercare following surgery on the circulatory system: Secondary | ICD-10-CM

## 2015-10-25 DIAGNOSIS — I739 Peripheral vascular disease, unspecified: Secondary | ICD-10-CM

## 2015-10-27 ENCOUNTER — Encounter: Payer: Self-pay | Admitting: Family

## 2015-10-31 ENCOUNTER — Ambulatory Visit (INDEPENDENT_AMBULATORY_CARE_PROVIDER_SITE_OTHER)
Admission: RE | Admit: 2015-10-31 | Discharge: 2015-10-31 | Disposition: A | Discharge status: Home / Self Care | Payer: Medicare Other | Source: Ambulatory Visit | Attending: Surgery | Admitting: Surgery

## 2015-10-31 ENCOUNTER — Ambulatory Visit (INDEPENDENT_AMBULATORY_CARE_PROVIDER_SITE_OTHER): Payer: Medicare Other | Admitting: Family

## 2015-10-31 ENCOUNTER — Encounter: Payer: Self-pay | Admitting: Family

## 2015-10-31 ENCOUNTER — Ambulatory Visit (HOSPITAL_COMMUNITY)
Admission: RE | Admit: 2015-10-31 | Discharge: 2015-10-31 | Disposition: A | Discharge status: Home / Self Care | Payer: Medicare Other | Source: Ambulatory Visit | Attending: Family | Admitting: Family

## 2015-10-31 VITALS — BP 140/82 | HR 55 | Ht 72.0 in | Wt 255.0 lb

## 2015-10-31 DIAGNOSIS — Z95828 Presence of other vascular implants and grafts: Secondary | ICD-10-CM

## 2015-10-31 DIAGNOSIS — Z87891 Personal history of nicotine dependence: Secondary | ICD-10-CM

## 2015-10-31 DIAGNOSIS — Z48812 Encounter for surgical aftercare following surgery on the circulatory system: Secondary | ICD-10-CM

## 2015-10-31 DIAGNOSIS — I739 Peripheral vascular disease, unspecified: Secondary | ICD-10-CM

## 2015-10-31 DIAGNOSIS — I779 Disorder of arteries and arterioles, unspecified: Secondary | ICD-10-CM

## 2015-10-31 NOTE — Patient Instructions (Signed)
Peripheral Vascular Disease Peripheral vascular disease (PVD) is a disease of the blood vessels that are not part of your heart and brain. A simple term for PVD is poor circulation. In most cases, PVD narrows the blood vessels that carry blood from your heart to the rest of your body. This can result in a decreased supply of blood to your arms, legs, and internal organs, like your stomach or kidneys. However, it most often affects a person's lower legs and feet. There are two types of PVD.  Organic PVD. This is the more common type. It is caused by damage to the structure of blood vessels.  Functional PVD. This is caused by conditions that make blood vessels contract and tighten (spasm). Without treatment, PVD tends to get worse over time. PVD can also lead to acute ischemic limb. This is when an arm or limb suddenly has trouble getting enough blood. This is a medical emergency. CAUSES Each type of PVD has many different causes. The most common cause of PVD is buildup of a fatty material (plaque) inside of your arteries (atherosclerosis). Small amounts of plaque can break off from the walls of the blood vessels and become lodged in a smaller artery. This blocks blood flow and can cause acute ischemic limb. Other common causes of PVD include:  Blood clots that form inside of blood vessels.  Injuries to blood vessels.  Diseases that cause inflammation of blood vessels or cause blood vessel spasms.  Health behaviors and health history that increase your risk of developing PVD. RISK FACTORS  You may have a greater risk of PVD if you:  Have a family history of PVD.  Have certain medical conditions, including:  High cholesterol.  Diabetes.  High blood pressure (hypertension).  Coronary heart disease.  Past problems with blood clots.  Past injury, such as burns or a broken bone. These may have damaged blood vessels in your limbs.  Buerger disease. This is caused by inflamed blood  vessels in your hands and feet.  Some forms of arthritis.  Rare birth defects that affect the arteries in your legs.  Use tobacco.  Do not get enough exercise.  Are obese.  Are age 50 or older. SIGNS AND SYMPTOMS  PVD may cause many different symptoms. Your symptoms depend on what part of your body is not getting enough blood. Some common signs and symptoms include:  Cramps in your lower legs. This may be a symptom of poor leg circulation (claudication).  Pain and weakness in your legs while you are physically active that goes away when you rest (intermittent claudication).  Leg pain when at rest.  Leg numbness, tingling, or weakness.  Coldness in a leg or foot, especially when compared with the other leg.  Skin or hair changes. These can include:  Hair loss.  Shiny skin.  Pale or bluish skin.  Thick toenails.  Inability to get or maintain an erection (erectile dysfunction). People with PVD are more prone to developing ulcers and sores on their toes, feet, or legs. These may take longer than normal to heal. DIAGNOSIS Your health care provider may diagnose PVD from your signs and symptoms. The health care provider will also do a physical exam. You may have tests to find out what is causing your PVD and determine its severity. Tests may include:  Blood pressure recordings from your arms and legs and measurements of the strength of your pulses (pulse volume recordings).  Imaging studies using sound waves to take pictures of   the blood flow through your blood vessels (Doppler ultrasound).  Injecting a dye into your blood vessels before having imaging studies using:  X-rays (angiogram or arteriogram).  Computer-generated X-rays (CT angiogram).  A powerful electromagnetic field and a computer (magnetic resonance angiogram or MRA). TREATMENT Treatment for PVD depends on the cause of your condition and the severity of your symptoms. It also depends on your age. Underlying  causes need to be treated and controlled. These include long-lasting (chronic) conditions, such as diabetes, high cholesterol, and high blood pressure. You may need to first try making lifestyle changes and taking medicines. Surgery may be needed if these do not work. Lifestyle changes may include:  Quitting smoking.  Exercising regularly.  Following a low-fat, low-cholesterol diet. Medicines may include:  Blood thinners to prevent blood clots.  Medicines to improve blood flow.  Medicines to improve your blood cholesterol levels. Surgical procedures may include:  A procedure that uses an inflated balloon to open a blocked artery and improve blood flow (angioplasty).  A procedure to put in a tube (stent) to keep a blocked artery open (stent implant).  Surgery to reroute blood flow around a blocked artery (peripheral bypass surgery).  Surgery to remove dead tissue from an infected wound on the affected limb.  Amputation. This is surgical removal of the affected limb. This may be necessary in cases of acute ischemic limb that are not improved through medical or surgical treatments. HOME CARE INSTRUCTIONS  Take medicines only as directed by your health care provider.  Do not use any tobacco products, including cigarettes, chewing tobacco, or electronic cigarettes. If you need help quitting, ask your health care provider.  Lose weight if you are overweight, and maintain a healthy weight as directed by your health care provider.  Eat a diet that is low in fat and cholesterol. If you need help, ask your health care provider.  Exercise regularly. Ask your health care provider to suggest some good activities for you.  Use compression stockings or other mechanical devices as directed by your health care provider.  Take good care of your feet.  Wear comfortable shoes that fit well.  Check your feet often for any cuts or sores. SEEK MEDICAL CARE IF:  You have cramps in your legs  while walking.  You have leg pain when you are at rest.  You have coldness in a leg or foot.  Your skin changes.  You have erectile dysfunction.  You have cuts or sores on your feet that are not healing. SEEK IMMEDIATE MEDICAL CARE IF:  Your arm or leg turns cold and blue.  Your arms or legs become red, warm, swollen, painful, or numb.  You have chest pain or trouble breathing.  You suddenly have weakness in your face, arm, or leg.  You become very confused or lose the ability to speak.  You suddenly have a very bad headache or lose your vision.   This information is not intended to replace advice given to you by your health care provider. Make sure you discuss any questions you have with your health care provider.   Document Released: 01/17/2005 Document Revised: 12/31/2014 Document Reviewed: 05/20/2014 Elsevier Interactive Patient Education 2016 [employer]  

## 2015-10-31 NOTE — Progress Notes (Signed)
[facility] [place] HISTORY AND PHYSICAL -PAD  History of Present Illness [name] is a 69 y.o. male patient of Dr. Myra Gianotti who is status post left femoropopliteal bypass graft in 2011.  He returns today for follow up. He reports that he had a stroke during his cardiac stent placement in 2012, no further stroke symptoms since 2012, carotid Duplex in 2014 revealed minimal bilateral ICA stenosis. He has right leg and arm residual weakness, has right facial droop, he is able to cut grass with his riding lawn mower. He denies claudication symptoms with walking, he is continuing to work on his rehab at home. He denies non-healing wounds. He is exercising daily including 5 miles daily on a recumbent bike.  Pt states his systolic blood pressure at home is about 130, states it increases in a medical setting.  He has lost 14 pounds in the last year intentionally.  Patient denies New Medical or Surgical History.   Pt Diabetic: diet controlled Pt smoker: former smoker, quit 1994   Pt meds include:  Statin :Yes  Betablocker: Yes  ASA: Yes  Other anticoagulants/antiplatelets: no    Past Medical History  Diagnosis Date  . Coronary artery disease   . Diabetes mellitus   . Stroke [facility]) feb 2012    complication of cath  . Myocardial infarction (HCC) 2011  . Gout   . Peripheral vascular disease (HCC)   . Arthritis     Gout    Social History Social History  Substance Use Topics  . Smoking status: Former Smoker -- 40 years  . Smokeless tobacco: Never Used  . Alcohol Use: No    Family History Family History  Problem Relation Age of Onset  . Heart disease Mother   . Heart disease Father   . Diabetes Father   . Heart attack Father     Past Surgical History  Procedure Laterality Date  . Coronary artery bypass graft    . Femoral bypass Left Sept. 30, 2011    Left Fem to above knee Pop Artery BPG  . Cardiac catheterization      stent done  feb 2012  . Vascular surgery      No Known Allergies  Current Outpatient Prescriptions  Medication Sig Dispense Refill  . allopurinol (ZYLOPRIM) 100 MG tablet Take 100 mg by mouth daily.    Marland Kitchen aspirin 325 MG tablet Take 325 mg by mouth daily.    Marland Kitchen atenolol (TENORMIN) 50 MG tablet Take 0.5 tablets (25 mg total) by mouth 2 (two) times daily. 30 tablet 10  . atorvastatin (LIPITOR) 40 MG tablet Take 1 tablet (40 mg total) by mouth daily. 90 tablet 3  . glipiZIDE (GLUCOTROL) 5 MG tablet Take 2.5 mg by mouth 2 (two) times daily before a meal.     . lisinopril (PRINIVIL,ZESTRIL) 2.5 MG tablet Take 2.5 mg by mouth daily.     . nitroGLYCERIN (NITROSTAT) 0.4 MG SL tablet Place 1 tablet (0.4 mg total) under the tongue every 5 (five) minutes as needed for chest pain. 25 tablet 3  . tiZANidine (ZANAFLEX) 2 MG tablet TAKE 1 TABLET TWICE A DAY BEFORE A MEAL 60 tablet 5   No current facility-administered medications for this visit.    ROS: See HPI for pertinent positives and negatives.   Physical Examination  Filed Vitals:   10/31/15 1021  BP: 140/82  Pulse: 55  Height: 6' (1.829 m)  Weight: 255 lb (115.667 kg)  SpO2: 94%  Body mass index is 34.58 kg/(m^2).    General: A&O x 3, WDWN, obese male.  Gait: using rolling walker, weakness in right arm and leg Eyes: PERRLA  Pulmonary: CTAB, without wheezes , rales or rhonchi  Cardiac: regular rhythm, no detected murmur.   Carotid Bruits  Left  Right    Negative  Negative   Aorta: not palpable  Radial pulses: 2+ palpable and equal.   VASCULAR EXAM:  Extremities without ischemic changes  without Gangrene; without open wounds.   LE Pulses  LEFT  RIGHT   POPLITEAL  not palpable  not palpable   POSTERIOR TIBIAL  not palpable  not palpable   DORSALIS PEDIS  ANTERIOR TIBIAL  palpable  palpable    Abdomen: soft, NT, no masses palpated.  Skin: no rashes, no ulcers.  Musculoskeletal: no  muscle wasting or atrophy.  Neurologic: A&O X 3; Appropriate Affect ; SENSATION: normal; MOTOR FUNCTION: moving all extremities equally, motor strength 4/5 in right upper and lower extremities, 5/5 in left upper and lower extremities.  Speech is fluent/aphasic. CN 2-12 intact except right facial droop with smile and tongue deviates slightly to the right, right shoulder shrug is less than left.           Non-Invasive Vascular Imaging: DATE: 10/31/2015 LOWER EXTREMITY ARTERIAL DUPLEX EVALUATION    INDICATION: Peripheral vascular disease    PREVIOUS INTERVENTION(S): Left femoral-popliteal artery bypass graft 09/22/2010    DUPLEX EXAM:     RIGHT  LEFT   Peak Systolic Velocity (cm/s) Ratio (if abnormal) Waveform  Peak Systolic Velocity (cm/s) Ratio (if abnormal) Waveform     Inflow Artery 141  T     Proximal Anastomosis 56  T     Proximal Graft 49  T     Mid Graft 35  T      Distal Graft 34  T     Distal Anastomosis 134  T     Outflow Artery 108/66  T/T  0.89/1.09 Today's ABI / TBI 1.06/1.12  0.94/1.12 Previous ABI / TBI (10/25/2014  ) 1.07/0.86    Waveform:    M - Monophasic       B - Biphasic       T - Triphasic  If Ankle Brachial Index (ABI) or Toe Brachial Index (TBI) performed, please see complete report     ADDITIONAL FINDINGS:     IMPRESSION: Widely patent left femoral to popliteal artery bypass graft, no hemodynamically significant plaque or thrombus present.    Compared to the previous exam:  Minimal decrease right and stable left ankle brachial indices since study on 10/25/2014.     ASSESSMENT: [name] is a 69 y.o. male who is status post left femoropopliteal bypass graft in 2011. He had a stroke during his cardiac stent placement in 2012, no further stroke symptoms since 2012, carotid Duplex in 2014 revealed minimal bilateral ICA stenosis. He has right leg and arm residual weakness, has right facial droop, he is able to cut grass with his riding lawn  mower. He has no claudication symptoms with walking, he is continuing to work on his rehab at home, walks with a walker. He has no signs of ischemia in his legs/feet. He is exercising daily including 5 miles daily on a recumbent bike. Today's left LE arterial duplex suggests a widely patent left femoral to popliteal artery bypass graft, no hemodynamically significant plaque or thrombus. Minimal decrease right and stable left ankle brachial indices since study on 10/25/2014.  ABI's with all triphasic waveforms except for biphasic right DP, both TBI's are normal.    PLAN:  Based on the patient's vascular studies and examination, pt will return to clinic in 1 year withleft LE arterial Duplex and ABI's.   I discussed in depth with the patient the nature of atherosclerosis, and emphasized the importance of maximal medical management including strict control of blood pressure, blood glucose, and lipid levels, obtaining regular exercise, and continued cessation of smoking.  The patient is aware that without maximal medical management the underlying atherosclerotic disease process will progress, limiting the benefit of any interventions.  The patient was given information about PAD including signs, symptoms, treatment, what symptoms should prompt the patient to seek immediate medical care, and risk reduction measures to take.  Charisse March, RN, MSN, FNP-C [facility] [place] Phone: [phone]  Clinic MD: Myra Gianotti  10/31/2015 10:22 AM

## 2015-12-06 ENCOUNTER — Ambulatory Visit: Payer: Medicare Other | Admitting: Physical Medicine & Rehabilitation

## 2015-12-06 ENCOUNTER — Encounter: Payer: Medicare Other | Attending: Physical Medicine & Rehabilitation

## 2016-02-13 DIAGNOSIS — Z7984 Long term (current) use of oral hypoglycemic drugs: Secondary | ICD-10-CM | POA: Diagnosis not present

## 2016-02-13 DIAGNOSIS — I69351 Hemiplegia and hemiparesis following cerebral infarction affecting right dominant side: Secondary | ICD-10-CM | POA: Diagnosis not present

## 2016-02-13 DIAGNOSIS — E1122 Type 2 diabetes mellitus with diabetic chronic kidney disease: Secondary | ICD-10-CM | POA: Diagnosis not present

## 2016-02-13 DIAGNOSIS — E7800 Pure hypercholesterolemia, unspecified: Secondary | ICD-10-CM | POA: Diagnosis not present

## 2016-02-13 DIAGNOSIS — Z125 Encounter for screening for malignant neoplasm of prostate: Secondary | ICD-10-CM | POA: Diagnosis not present

## 2016-02-13 DIAGNOSIS — I63422 Cerebral infarction due to embolism of left anterior cerebral artery: Secondary | ICD-10-CM | POA: Diagnosis not present

## 2016-02-13 DIAGNOSIS — I25810 Atherosclerosis of coronary artery bypass graft(s) without angina pectoris: Secondary | ICD-10-CM | POA: Diagnosis not present

## 2016-02-13 DIAGNOSIS — M109 Gout, unspecified: Secondary | ICD-10-CM | POA: Diagnosis not present

## 2016-02-13 DIAGNOSIS — Z1211 Encounter for screening for malignant neoplasm of colon: Secondary | ICD-10-CM | POA: Diagnosis not present

## 2016-02-13 DIAGNOSIS — I739 Peripheral vascular disease, unspecified: Secondary | ICD-10-CM | POA: Diagnosis not present

## 2016-02-13 DIAGNOSIS — I10 Essential (primary) hypertension: Secondary | ICD-10-CM | POA: Diagnosis not present

## 2016-02-13 DIAGNOSIS — N182 Chronic kidney disease, stage 2 (mild): Secondary | ICD-10-CM | POA: Diagnosis not present

## 2016-10-31 ENCOUNTER — Encounter: Payer: Self-pay | Admitting: Family

## 2016-11-05 ENCOUNTER — Ambulatory Visit (INDEPENDENT_AMBULATORY_CARE_PROVIDER_SITE_OTHER)
Admission: RE | Admit: 2016-11-05 | Discharge: 2016-11-05 | Disposition: A | Discharge status: Home / Self Care | Payer: Medicare Other | Source: Ambulatory Visit | Attending: Family | Admitting: Family

## 2016-11-05 ENCOUNTER — Encounter: Payer: Self-pay | Admitting: Family

## 2016-11-05 ENCOUNTER — Ambulatory Visit (INDEPENDENT_AMBULATORY_CARE_PROVIDER_SITE_OTHER): Payer: Medicare Other | Admitting: Family

## 2016-11-05 ENCOUNTER — Other Ambulatory Visit: Payer: Self-pay | Admitting: *Deleted

## 2016-11-05 ENCOUNTER — Ambulatory Visit (HOSPITAL_COMMUNITY)
Admission: RE | Admit: 2016-11-05 | Discharge: 2016-11-05 | Disposition: A | Discharge status: Home / Self Care | Payer: Medicare Other | Source: Ambulatory Visit | Attending: Family | Admitting: Family

## 2016-11-05 VITALS — BP 134/86 | HR 66 | Temp 97.3°F | Resp 20 | Ht 72.0 in | Wt 264.0 lb

## 2016-11-05 DIAGNOSIS — Z87891 Personal history of nicotine dependence: Secondary | ICD-10-CM | POA: Diagnosis not present

## 2016-11-05 DIAGNOSIS — I779 Disorder of arteries and arterioles, unspecified: Secondary | ICD-10-CM | POA: Diagnosis not present

## 2016-11-05 DIAGNOSIS — Z95828 Presence of other vascular implants and grafts: Secondary | ICD-10-CM

## 2016-11-05 DIAGNOSIS — I739 Peripheral vascular disease, unspecified: Secondary | ICD-10-CM

## 2016-11-05 DIAGNOSIS — Z48812 Encounter for surgical aftercare following surgery on the circulatory system: Secondary | ICD-10-CM | POA: Diagnosis not present

## 2016-11-05 NOTE — Progress Notes (Signed)
[facility] [place]   CC: Follow up peripheral artery occlusive disease  History of Present Illness [name] is a 70 y.o. male patient of Dr. Myra GianottiBrabham who is status post left femoropopliteal bypass graft in 2011.  He returns today for follow up. He reports that he had a stroke during his cardiac stent placement in 2012, no further stroke symptoms since 2012, carotid Duplex in 2014 revealed minimal bilateral ICA stenosis. He has right leg and arm residual weakness, has right facial droop, he is able to cut grass with his riding lawn mower. He denies claudication symptoms with walking, he is continuing to work on his rehab at home. He denies non-healing wounds. He is exercising daily including 5 miles daily on a recumbent bike.  Pt states his systolic blood pressure at home is about 130, states it increases in a medical setting.   Patient denies New Medical or Surgical History.   Pt Diabetic: 6.0  A1C in March 2016 Pt smoker: former smoker, quit 1994   Pt meds include:  Statin :Yes  Betablocker: Yes  ASA: Yes  Other anticoagulants/antiplatelets: no     Past Medical History:  Diagnosis Date  . Arthritis    Gout  . Coronary artery disease   . Diabetes mellitus   . Gout   . Myocardial infarction 2011  . Peripheral vascular disease (HCC)   . Stroke [facility](HCC) feb 2012   complication of cath    Social History Social History  Substance Use Topics  . Smoking status: Former Smoker    Years: 40.00  . Smokeless tobacco: Never Used  . Alcohol use No    Family History Family History  Problem Relation Age of Onset  . Heart disease Mother   . Heart disease Father   . Diabetes Father   . Heart attack Father     Past Surgical History:  Procedure Laterality Date  . CARDIAC CATHETERIZATION     stent done feb 2012  . CORONARY ARTERY BYPASS GRAFT    . FEMORAL BYPASS Left Sept. 30, 2011   Left Fem to above knee Pop Artery BPG  .  VASCULAR SURGERY      No Known Allergies  Current Outpatient Prescriptions  Medication Sig Dispense Refill  . allopurinol (ZYLOPRIM) 100 MG tablet Take 100 mg by mouth daily.    Marland Kitchen. aspirin 325 MG tablet Take 325 mg by mouth daily.    Marland Kitchen. atenolol (TENORMIN) 50 MG tablet Take 0.5 tablets (25 mg total) by mouth 2 (two) times daily. 30 tablet 10  . atorvastatin (LIPITOR) 40 MG tablet Take 1 tablet (40 mg total) by mouth daily. 90 tablet 3  . glipiZIDE (GLUCOTROL) 5 MG tablet Take 2.5 mg by mouth 2 (two) times daily before a meal.     . lisinopril (PRINIVIL,ZESTRIL) 2.5 MG tablet Take 2.5 mg by mouth daily.     . nitroGLYCERIN (NITROSTAT) 0.4 MG SL tablet Place 1 tablet (0.4 mg total) under the tongue every 5 (five) minutes as needed for chest pain. 25 tablet 3  . tiZANidine (ZANAFLEX) 2 MG tablet TAKE 1 TABLET TWICE A DAY BEFORE A MEAL 60 tablet 5   No current facility-administered medications for this visit.     ROS: See HPI for pertinent positives and negatives.   Physical Examination  Vitals:   11/05/16 1117  BP: 134/86  Pulse: 66  Resp: 20  Temp: 97.3 F (36.3 C)  TempSrc: Oral  SpO2: 93%  Weight: 264  lb (119.7 kg)  Height: 6' (1.829 m)   Body mass index is 35.8 kg/m.  General: A&O x 3, WDWN, obese male.  Gait: using rolling walker, weakness in right arm and leg Eyes: PERRLA  Pulmonary: CTAB, without wheezes , rales or rhonchi  Cardiac: regular rhythm, no detected murmur.   Carotid Bruits  Left  Right    Negative  Negative   Aorta: not palpable  Radial pulses: 2+ palpable and equal.   VASCULAR EXAM:  Extremities without ischemic changes  without Gangrene; without open wounds.   LE Pulses  LEFT  RIGHT   POPLITEAL  not palpable  not palpable   POSTERIOR TIBIAL  not palpable  not palpable   DORSALIS PEDIS  ANTERIOR TIBIAL  palpable  palpable    Abdomen: soft, NT, no masses palpated.  Skin: no rashes, no  ulcers.  Musculoskeletal: no muscle wasting or atrophy.  Neurologic: A&O X 3; Appropriate Affect ; SENSATION: normal; MOTOR FUNCTION: moving all extremities equally, motor strength 4/5 in right upper and lower extremities, 5/5 in left upper and lower extremities.  Speech is fluent/aphasic. CN 2-12 intact except right facial droop with smile, right shoulder shrug is less than left    ASSESSMENT: [name] is a 70 y.o. male who is status post left femoropopliteal bypass graft in 2011. He had a stroke during his cardiac stent placement in 2012, no further stroke symptoms since 2012, carotid Duplex in 2014 revealed minimal bilateral ICA stenosis. He has right leg and arm residual weakness, has right facial droop, he is able to cut grass with his riding lawn mower. He has no claudication symptoms with walking, he is continuing to work on his rehab at home, walks with a walker. He has no signs of ischemia in his legs/feet. He is exercising daily including 5 miles daily on a recumbent bike.  DATA Today's left LE arterial duplex suggests a widely patent left femoral to popliteal artery bypass graft, no hemodynamically significant plaque or thrombus.  ABI's: right improved to 100% from 0.89; left remains normal, all triphasic waveforms. Both TBI's are normal.   PLAN:  Continue extensive exercising.  Based on the patient's vascular studies and examination, pt will return to clinic in 1 year withleft LE arterial Duplex and ABI's.    I discussed in depth with the patient the nature of atherosclerosis, and emphasized the importance of maximal medical management including strict control of blood pressure, blood glucose, and lipid levels, obtaining regular exercise, and continued cessation of smoking.  The patient is aware that without maximal medical management the underlying atherosclerotic disease process will progress, limiting the benefit of any interventions.  The patient was given  information about PAD including signs, symptoms, treatment, what symptoms should prompt the patient to seek immediate medical care, and risk reduction measures to take.  Charisse MarchSuzanne Arleny Kruger, RN, MSN, FNP-C [facility] [place] Office Phone: [phone]  Clinic MD: Imogene BurnChen on call  11/05/16 11:31 AM

## 2016-11-05 NOTE — Patient Instructions (Signed)
Peripheral Vascular Disease Peripheral vascular disease (PVD) is a disease of the blood vessels that are not part of your heart and brain. A simple term for PVD is poor circulation. In most cases, PVD narrows the blood vessels that carry blood from your heart to the rest of your body. This can result in a decreased supply of blood to your arms, legs, and internal organs, like your stomach or kidneys. However, it most often affects a person's lower legs and feet. There are two types of PVD.  Organic PVD. This is the more common type. It is caused by damage to the structure of blood vessels.  Functional PVD. This is caused by conditions that make blood vessels contract and tighten (spasm). Without treatment, PVD tends to get worse over time. PVD can also lead to acute ischemic limb. This is when an arm or limb suddenly has trouble getting enough blood. This is a medical emergency. CAUSES Each type of PVD has many different causes. The most common cause of PVD is buildup of a fatty material (plaque) inside of your arteries (atherosclerosis). Small amounts of plaque can break off from the walls of the blood vessels and become lodged in a smaller artery. This blocks blood flow and can cause acute ischemic limb. Other common causes of PVD include:  Blood clots that form inside of blood vessels.  Injuries to blood vessels.  Diseases that cause inflammation of blood vessels or cause blood vessel spasms.  Health behaviors and health history that increase your risk of developing PVD. RISK FACTORS  You may have a greater risk of PVD if you:  Have a family history of PVD.  Have certain medical conditions, including:  High cholesterol.  Diabetes.  High blood pressure (hypertension).  Coronary heart disease.  Past problems with blood clots.  Past injury, such as burns or a broken bone. These may have damaged blood vessels in your limbs.  Buerger disease. This is caused by inflamed blood  vessels in your hands and feet.  Some forms of arthritis.  Rare birth defects that affect the arteries in your legs.  Use tobacco.  Do not get enough exercise.  Are obese.  Are age 50 or older. SIGNS AND SYMPTOMS  PVD may cause many different symptoms. Your symptoms depend on what part of your body is not getting enough blood. Some common signs and symptoms include:  Cramps in your lower legs. This may be a symptom of poor leg circulation (claudication).  Pain and weakness in your legs while you are physically active that goes away when you rest (intermittent claudication).  Leg pain when at rest.  Leg numbness, tingling, or weakness.  Coldness in a leg or foot, especially when compared with the other leg.  Skin or hair changes. These can include:  Hair loss.  Shiny skin.  Pale or bluish skin.  Thick toenails.  Inability to get or maintain an erection (erectile dysfunction). People with PVD are more prone to developing ulcers and sores on their toes, feet, or legs. These may take longer than normal to heal. DIAGNOSIS Your health care provider may diagnose PVD from your signs and symptoms. The health care provider will also do a physical exam. You may have tests to find out what is causing your PVD and determine its severity. Tests may include:  Blood pressure recordings from your arms and legs and measurements of the strength of your pulses (pulse volume recordings).  Imaging studies using sound waves to take pictures of   the blood flow through your blood vessels (Doppler ultrasound).  Injecting a dye into your blood vessels before having imaging studies using:  X-rays (angiogram or arteriogram).  Computer-generated X-rays (CT angiogram).  A powerful electromagnetic field and a computer (magnetic resonance angiogram or MRA). TREATMENT Treatment for PVD depends on the cause of your condition and the severity of your symptoms. It also depends on your age. Underlying  causes need to be treated and controlled. These include long-lasting (chronic) conditions, such as diabetes, high cholesterol, and high blood pressure. You may need to first try making lifestyle changes and taking medicines. Surgery may be needed if these do not work. Lifestyle changes may include:  Quitting smoking.  Exercising regularly.  Following a low-fat, low-cholesterol diet. Medicines may include:  Blood thinners to prevent blood clots.  Medicines to improve blood flow.  Medicines to improve your blood cholesterol levels. Surgical procedures may include:  A procedure that uses an inflated balloon to open a blocked artery and improve blood flow (angioplasty).  A procedure to put in a tube (stent) to keep a blocked artery open (stent implant).  Surgery to reroute blood flow around a blocked artery (peripheral bypass surgery).  Surgery to remove dead tissue from an infected wound on the affected limb.  Amputation. This is surgical removal of the affected limb. This may be necessary in cases of acute ischemic limb that are not improved through medical or surgical treatments. HOME CARE INSTRUCTIONS  Take medicines only as directed by your health care provider.  Do not use any tobacco products, including cigarettes, chewing tobacco, or electronic cigarettes. If you need help quitting, ask your health care provider.  Lose weight if you are overweight, and maintain a healthy weight as directed by your health care provider.  Eat a diet that is low in fat and cholesterol. If you need help, ask your health care provider.  Exercise regularly. Ask your health care provider to suggest some good activities for you.  Use compression stockings or other mechanical devices as directed by your health care provider.  Take good care of your feet.  Wear comfortable shoes that fit well.  Check your feet often for any cuts or sores. SEEK MEDICAL CARE IF:  You have cramps in your legs  while walking.  You have leg pain when you are at rest.  You have coldness in a leg or foot.  Your skin changes.  You have erectile dysfunction.  You have cuts or sores on your feet that are not healing. SEEK IMMEDIATE MEDICAL CARE IF:  Your arm or leg turns cold and blue.  Your arms or legs become red, warm, swollen, painful, or numb.  You have chest pain or trouble breathing.  You suddenly have weakness in your face, arm, or leg.  You become very confused or lose the ability to speak.  You suddenly have a very bad headache or lose your vision.   This information is not intended to replace advice given to you by your health care provider. Make sure you discuss any questions you have with your health care provider.   Document Released: 01/17/2005 Document Revised: 12/31/2014 Document Reviewed: 05/20/2014 Elsevier Interactive Patient Education 2016 [employer]  

## 2017-10-04 ENCOUNTER — Emergency Department (HOSPITAL_COMMUNITY): Payer: Medicare Other

## 2017-10-04 ENCOUNTER — Observation Stay (HOSPITAL_COMMUNITY): Payer: Medicare Other

## 2017-10-04 ENCOUNTER — Encounter (HOSPITAL_COMMUNITY): Payer: Self-pay | Admitting: Emergency Medicine

## 2017-10-04 ENCOUNTER — Observation Stay (HOSPITAL_COMMUNITY)
Admission: EM | Admit: 2017-10-04 | Discharge: 2017-10-06 | Disposition: A | Discharge status: Home / Self Care | Payer: Medicare Other | Attending: Internal Medicine | Admitting: Internal Medicine

## 2017-10-04 DIAGNOSIS — I25810 Atherosclerosis of coronary artery bypass graft(s) without angina pectoris: Secondary | ICD-10-CM | POA: Diagnosis not present

## 2017-10-04 DIAGNOSIS — Z794 Long term (current) use of insulin: Secondary | ICD-10-CM | POA: Diagnosis not present

## 2017-10-04 DIAGNOSIS — E7800 Pure hypercholesterolemia, unspecified: Secondary | ICD-10-CM | POA: Insufficient documentation

## 2017-10-04 DIAGNOSIS — I739 Peripheral vascular disease, unspecified: Secondary | ICD-10-CM | POA: Diagnosis present

## 2017-10-04 DIAGNOSIS — I10 Essential (primary) hypertension: Secondary | ICD-10-CM | POA: Diagnosis present

## 2017-10-04 DIAGNOSIS — Z7982 Long term (current) use of aspirin: Secondary | ICD-10-CM | POA: Insufficient documentation

## 2017-10-04 DIAGNOSIS — E1165 Type 2 diabetes mellitus with hyperglycemia: Secondary | ICD-10-CM | POA: Insufficient documentation

## 2017-10-04 DIAGNOSIS — E1151 Type 2 diabetes mellitus with diabetic peripheral angiopathy without gangrene: Secondary | ICD-10-CM | POA: Insufficient documentation

## 2017-10-04 DIAGNOSIS — Z6834 Body mass index (BMI) 34.0-34.9, adult: Secondary | ICD-10-CM | POA: Insufficient documentation

## 2017-10-04 DIAGNOSIS — I25709 Atherosclerosis of coronary artery bypass graft(s), unspecified, with unspecified angina pectoris: Secondary | ICD-10-CM

## 2017-10-04 DIAGNOSIS — Z951 Presence of aortocoronary bypass graft: Secondary | ICD-10-CM | POA: Insufficient documentation

## 2017-10-04 DIAGNOSIS — E782 Mixed hyperlipidemia: Secondary | ICD-10-CM | POA: Diagnosis not present

## 2017-10-04 DIAGNOSIS — R9431 Abnormal electrocardiogram [ECG] [EKG]: Secondary | ICD-10-CM | POA: Diagnosis not present

## 2017-10-04 DIAGNOSIS — I69359 Hemiplegia and hemiparesis following cerebral infarction affecting unspecified side: Secondary | ICD-10-CM

## 2017-10-04 DIAGNOSIS — I252 Old myocardial infarction: Secondary | ICD-10-CM | POA: Insufficient documentation

## 2017-10-04 DIAGNOSIS — G459 Transient cerebral ischemic attack, unspecified: Principal | ICD-10-CM | POA: Insufficient documentation

## 2017-10-04 DIAGNOSIS — Z955 Presence of coronary angioplasty implant and graft: Secondary | ICD-10-CM | POA: Insufficient documentation

## 2017-10-04 DIAGNOSIS — I69351 Hemiplegia and hemiparesis following cerebral infarction affecting right dominant side: Secondary | ICD-10-CM | POA: Insufficient documentation

## 2017-10-04 DIAGNOSIS — I639 Cerebral infarction, unspecified: Secondary | ICD-10-CM | POA: Diagnosis present

## 2017-10-04 DIAGNOSIS — Z87891 Personal history of nicotine dependence: Secondary | ICD-10-CM | POA: Insufficient documentation

## 2017-10-04 DIAGNOSIS — Z79899 Other long term (current) drug therapy: Secondary | ICD-10-CM | POA: Diagnosis not present

## 2017-10-04 DIAGNOSIS — I6789 Other cerebrovascular disease: Secondary | ICD-10-CM | POA: Diagnosis not present

## 2017-10-04 DIAGNOSIS — R29818 Other symptoms and signs involving the nervous system: Secondary | ICD-10-CM | POA: Diagnosis not present

## 2017-10-04 DIAGNOSIS — E669 Obesity, unspecified: Secondary | ICD-10-CM | POA: Diagnosis not present

## 2017-10-04 DIAGNOSIS — E785 Hyperlipidemia, unspecified: Secondary | ICD-10-CM | POA: Diagnosis present

## 2017-10-04 DIAGNOSIS — E1139 Type 2 diabetes mellitus with other diabetic ophthalmic complication: Secondary | ICD-10-CM | POA: Diagnosis not present

## 2017-10-04 DIAGNOSIS — R4781 Slurred speech: Secondary | ICD-10-CM | POA: Diagnosis not present

## 2017-10-04 LAB — COMPREHENSIVE METABOLIC PANEL
ALBUMIN: 3.4 g/dL — AB (ref 3.5–5.0)
ALT: 25 U/L (ref 17–63)
ANION GAP: 8 (ref 5–15)
AST: 27 U/L (ref 15–41)
Alkaline Phosphatase: 77 U/L (ref 38–126)
BILIRUBIN TOTAL: 1 mg/dL (ref 0.3–1.2)
BUN: 9 mg/dL (ref 6–20)
CHLORIDE: 104 mmol/L (ref 101–111)
CO2: 26 mmol/L (ref 22–32)
Calcium: 8.6 mg/dL — ABNORMAL LOW (ref 8.9–10.3)
Creatinine, Ser: 1.22 mg/dL (ref 0.61–1.24)
GFR calc Af Amer: >60 mL/min (ref >60)
GFR calc non Af Amer: 57 mL/min — ABNORMAL LOW (ref >60)
GLUCOSE: 168 mg/dL — AB (ref 65–99)
POTASSIUM: 3.9 mmol/L (ref 3.5–5.1)
Sodium: 138 mmol/L (ref 135–145)
TOTAL PROTEIN: 6.7 g/dL (ref 6.5–8.1)

## 2017-10-04 LAB — GLUCOSE, CAPILLARY: Glucose-Capillary: 87 mg/dL (ref 65–99)

## 2017-10-04 LAB — I-STAT CHEM 8, ED
BUN: 11 mg/dL (ref 6–20)
CREATININE: 1.1 mg/dL (ref 0.61–1.24)
Calcium, Ion: 1.06 mmol/L — ABNORMAL LOW (ref 1.15–1.40)
Chloride: 103 mmol/L (ref 101–111)
GLUCOSE: 170 mg/dL — AB (ref 65–99)
HEMATOCRIT: 50 % (ref 39.0–52.0)
HEMOGLOBIN: 17 g/dL (ref 13.0–17.0)
Potassium: 3.9 mmol/L (ref 3.5–5.1)
Sodium: 141 mmol/L (ref 135–145)
TCO2: 26 mmol/L (ref 22–32)

## 2017-10-04 LAB — CBG MONITORING, ED: GLUCOSE-CAPILLARY: 162 mg/dL — AB (ref 65–99)

## 2017-10-04 LAB — CBC
HCT: 46.3 % (ref 39.0–52.0)
HEMOGLOBIN: 15.5 g/dL (ref 13.0–17.0)
MCH: 30.9 pg (ref 26.0–34.0)
MCHC: 33.5 g/dL (ref 30.0–36.0)
MCV: 92.4 fL (ref 78.0–100.0)
PLATELETS: 202 10*3/uL (ref 150–400)
RBC: 5.01 MIL/uL (ref 4.22–5.81)
RDW: 13.4 % (ref 11.5–15.5)
WBC: 6.6 10*3/uL (ref 4.0–10.5)

## 2017-10-04 LAB — DIFFERENTIAL
BASOS ABS: 0 10*3/uL (ref 0.0–0.1)
Basophils Relative: 0 %
EOS ABS: 0.2 10*3/uL (ref 0.0–0.7)
EOS PCT: 3 %
LYMPHS ABS: 2 10*3/uL (ref 0.7–4.0)
Lymphocytes Relative: 30 %
Monocytes Absolute: 0.5 10*3/uL (ref 0.1–1.0)
Monocytes Relative: 7 %
NEUTROS PCT: 60 %
Neutro Abs: 3.9 10*3/uL (ref 1.7–7.7)

## 2017-10-04 LAB — I-STAT TROPONIN, ED: TROPONIN I, POC: 0 ng/mL (ref 0.00–0.08)

## 2017-10-04 LAB — PROTIME-INR
INR: 1.12
PROTHROMBIN TIME: 14.3 s (ref 11.4–15.2)

## 2017-10-04 LAB — APTT: APTT: 30 s (ref 24–36)

## 2017-10-04 MED ORDER — SENNOSIDES-DOCUSATE SODIUM 8.6-50 MG PO TABS: 1.0000 | ORAL_TABLET | Freq: Every evening | ORAL | Status: DC | PRN

## 2017-10-04 MED ORDER — ACETAMINOPHEN 160 MG/5ML PO SOLN: 650.0000 mg | ORAL | Status: DC | PRN

## 2017-10-04 MED ORDER — STROKE: EARLY STAGES OF RECOVERY BOOK
Freq: Once | Status: AC
  Administered 2017-10-06: 09:00:00

## 2017-10-04 MED ORDER — INSULIN ASPART 100 UNIT/ML ~~LOC~~ SOLN
0.0000 [IU] | Freq: Three times a day (TID) | SUBCUTANEOUS | Status: DC
Start: 2017-10-05 — End: 2017-10-06
  Administered 2017-10-05: 1 [IU] via SUBCUTANEOUS

## 2017-10-04 MED ORDER — ALLOPURINOL 100 MG PO TABS
100.0000 mg | ORAL_TABLET | Freq: Every day | ORAL | Status: DC
  Administered 2017-10-05 – 2017-10-06 (×2): 100 mg via ORAL
  Filled 2017-10-04 (×2): qty 1

## 2017-10-04 MED ORDER — SODIUM CHLORIDE 0.9 % IV SOLN: Freq: Once | INTRAVENOUS | Status: DC

## 2017-10-04 MED ORDER — ENOXAPARIN SODIUM 40 MG/0.4ML ~~LOC~~ SOLN
40.0000 mg | SUBCUTANEOUS | Status: DC
  Administered 2017-10-05: 40 mg via SUBCUTANEOUS
  Filled 2017-10-04: qty 0.4

## 2017-10-04 MED ORDER — INSULIN ASPART 100 UNIT/ML ~~LOC~~ SOLN: 0.0000 [IU] | Freq: Every day | SUBCUTANEOUS | Status: DC

## 2017-10-04 MED ORDER — ASPIRIN 300 MG RE SUPP: 300.0000 mg | Freq: Every day | RECTAL | Status: DC

## 2017-10-04 MED ORDER — ACETAMINOPHEN 650 MG RE SUPP: 650.0000 mg | RECTAL | Status: DC | PRN

## 2017-10-04 MED ORDER — ATORVASTATIN CALCIUM 40 MG PO TABS
40.0000 mg | ORAL_TABLET | Freq: Every day | ORAL | Status: DC
  Administered 2017-10-05 – 2017-10-06 (×2): 40 mg via ORAL
  Filled 2017-10-04 (×2): qty 1

## 2017-10-04 MED ORDER — ASPIRIN 325 MG PO TABS
325.0000 mg | ORAL_TABLET | Freq: Every day | ORAL | Status: DC
  Administered 2017-10-05 – 2017-10-06 (×2): 325 mg via ORAL
  Filled 2017-10-04 (×2): qty 1

## 2017-10-04 MED ORDER — ACETAMINOPHEN 325 MG PO TABS: 650.0000 mg | ORAL_TABLET | ORAL | Status: DC | PRN

## 2017-10-04 NOTE — ED Notes (Signed)
Neurologist at bedside with patient and patients wife.

## 2017-10-04 NOTE — ED Provider Notes (Signed)
[facility]-EMERGENCY DEPT Provider Note   CSN: [id number] Arrival date & time: 10/04/17  7829     History   Chief Complaint Chief Complaint  Patient presents with  . Code Stroke    HPI [name] is a 71 y.o. male.  The history is provided by the patient, a relative and medical records. No language interpreter was used.  Neurologic Problem  This is a new problem. The current episode started 1 to 2 hours ago. The problem occurs constantly. The problem has been rapidly improving. Pertinent negatives include no chest pain, no abdominal pain, no headaches and no shortness of breath. Nothing aggravates the symptoms. Nothing relieves the symptoms. He has tried nothing for the symptoms. The treatment provided no relief.    Past Medical History:  Diagnosis Date  . Arthritis    Gout  . Coronary artery disease   . Diabetes mellitus   . Gout   . Myocardial infarction 2011  . Peripheral vascular disease (HCC)   . Stroke [facility]) feb 2012   complication of cath    Patient Active Problem List   Diagnosis Date Noted  . DM (diabetes mellitus) (HCC) 03/18/2015  . Essential hypertension 03/18/2015  . HLD (hyperlipidemia) 03/18/2015  . Aftercare following surgery of the circulatory system 10/25/2014  . PVD (peripheral vascular disease) (HCC) 10/25/2014  . Occlusion and stenosis of carotid artery without mention of cerebral infarction 11/16/2013  . Aftercare following surgery of the circulatory system, NEC 10/19/2013  . Peripheral vascular disease, unspecified (HCC) 10/16/2012  . Venous insufficiency 02/19/2012  . CVA, old, hemiparesis (HCC) 02/18/2012  . CAD (coronary artery disease) of artery bypass graft 02/18/2012  . Cellulitis 02/17/2012  . HTN (hypertension) 02/17/2012  . 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 CARDIAC CATHETERIZATION     stent done feb 2012  .  CORONARY ARTERY BYPASS GRAFT    . FEMORAL BYPASS Left Sept. 30, 2011   Left Fem to above knee Pop Artery BPG  . VASCULAR SURGERY         Home Medications    Prior to Admission medications   Medication Sig Start Date End Date Taking? Authorizing Provider  allopurinol (ZYLOPRIM) 100 MG tablet Take 100 mg by mouth daily.    Kirsteins, Victorino Sparrow, MD  aspirin 325 MG tablet Take 325 mg by mouth daily.    [provider]  atenolol (TENORMIN) 50 MG tablet Take 0.5 tablets (25 mg total) by mouth 2 (two) times daily. 11/10/13   Lyn Records, MD  atorvastatin (LIPITOR) 40 MG tablet Take 1 tablet (40 mg total) by mouth daily. 03/22/15   Marvel Plan, MD  glipiZIDE (GLUCOTROL) 5 MG tablet Take 2.5 mg by mouth 2 (two) times daily before a meal.     [provider]  lisinopril (PRINIVIL,ZESTRIL) 2.5 MG tablet Take 2.5 mg by mouth daily.     [provider]  nitroGLYCERIN (NITROSTAT) 0.4 MG SL tablet Place 1 tablet (0.4 mg total) under the tongue every 5 (five) minutes as needed for chest pain. 07/19/14   Lyn Records, MD  tiZANidine (ZANAFLEX) 2 MG tablet TAKE 1 TABLET TWICE A DAY BEFORE A MEAL 07/19/15   Kirsteins, Victorino Sparrow, MD    Family History Family History  Problem Relation Age of Onset  . Heart disease Mother   . Heart disease Father   . Diabetes Father   .  Heart attack Father     Social History Social History  Substance Use Topics  . Smoking status: Former Smoker    Years: 40.00  . Smokeless tobacco: Never Used  . Alcohol use No     Allergies   Patient has no known allergies.   Review of Systems Review of Systems  Constitutional: Negative for chills, fatigue and fever.  HENT: Negative for congestion and rhinorrhea.   Respiratory: Negative for chest tightness, shortness of breath, wheezing and stridor.   Cardiovascular: Negative for chest pain and leg swelling.  Gastrointestinal: Negative for abdominal pain, constipation, diarrhea, nausea and  vomiting.  Genitourinary: Negative for decreased urine volume and flank pain.  Musculoskeletal: Negative for back pain, neck pain and neck stiffness.  Skin: Negative for rash and wound.  Neurological: Positive for facial asymmetry (at baseline), speech difficulty and weakness (at baseline R side). Negative for seizures, light-headedness and headaches.  Psychiatric/Behavioral: Negative for agitation and confusion.  All other systems reviewed and are negative.    Physical Exam Updated Vital Signs There were no vitals taken for this visit.  Physical Exam  Constitutional: He is oriented to person, place, and time. He appears well-developed and well-nourished. No distress.  HENT:  Head: Normocephalic and atraumatic.  Mouth/Throat: Oropharynx is clear and moist. No oropharyngeal exudate.  Eyes: Pupils are equal, round, and reactive to light. Conjunctivae and EOM are normal.  Neck: Normal range of motion. Neck supple.  Cardiovascular: Normal rate and intact distal pulses.   No murmur heard. Pulmonary/Chest: Effort normal and breath sounds normal. No stridor. No respiratory distress.  Abdominal: Soft. There is no tenderness.  Musculoskeletal: He exhibits no edema or tenderness.  Neurological: He is alert and oriented to person, place, and time. He is not disoriented. He displays normal reflexes. A cranial nerve deficit is present. No sensory deficit. He exhibits abnormal muscle tone. Coordination abnormal. GCS eye subscore is 4. GCS verbal subscore is 5. GCS motor subscore is 6.  Patient has right upper extremity ataxia. Mild right facial droop. Symmetric grip strength. Mild right lower extremity weakness. All of these are unchanged compared to prior per patient and family.  Speech was clear on my exam.  Skin: Skin is warm and dry. Capillary refill takes less than 2 seconds. He is not diaphoretic. No pallor.  Psychiatric: He has a normal mood and affect.  Nursing note and vitals  reviewed.    ED Treatments / Results  Labs (all labs ordered are listed, but only abnormal results are displayed) Labs Reviewed  COMPREHENSIVE METABOLIC PANEL - Abnormal; Notable for the following:       Result Value   Glucose, Bld 168 (*)    Calcium 8.6 (*)    Albumin 3.4 (*)    GFR calc non Af Amer 57 (*)    All other components within normal limits  CBG MONITORING, ED - Abnormal; Notable for the following:    Glucose-Capillary 162 (*)    All other components within normal limits  I-STAT CHEM 8, ED - Abnormal; Notable for the following:    Glucose, Bld 170 (*)    Calcium, Ion 1.06 (*)    All other components within normal limits  PROTIME-INR  APTT  CBC  DIFFERENTIAL  I-STAT TROPONIN, ED    EKG  EKG Interpretation  Date/Time:  Friday October 04 2017 08:58:24 EDT Ventricular Rate:  79 PR Interval:    QRS Duration: 109 QT Interval:  372 QTC Calculation: 427 R Axis:  49 Text Interpretation:  Sinus rhythm Ventricular premature complex Borderline prolonged PR interval Probable left atrial enlargement Inferior infarct, old Abnormal lateral Q waves Anterior infarct, old Baseline wander in lead(s) V3 When compared to prior no significant changes seen with similar deep Q waves in inferior leads.  No STEMI Confirmed by Theda Belfast ([id number]) on 10/04/2017 9:03:42 AM       Radiology Ct Head Code Stroke Wo Contrast  Result Date: 10/04/2017 CLINICAL DATA:  Code stroke.  Aphasia EXAM: CT HEAD WITHOUT CONTRAST TECHNIQUE: Contiguous axial images were obtained from the base of the skull through the vertex without intravenous contrast. COMPARISON:  Head CT 01/29/2011 FINDINGS: Brain: Questionable loss of insular ribbon anteriorly and inferiorly. No acute hemorrhage, hydrocephalus, or masslike finding. Remote infarct of the left midbrain and upper pons. Small remote left cerebellar infarct. The cerebellum has become progressively atrophic since 2012. Vascular: Atherosclerotic  calcification. No definite hyperdense vessel. Skull: No acute or aggressive finding Sinuses/Orbits: No pathologic finding.  Right cataract resection. Other: These results were called by telephone at the time of interpretation on 10/04/2017 at 8:58 am to Dr. Lynden Oxford , who verbally acknowledged these results. ASPECTS [facility] Stroke Program Early CT Score) -left hemisphere - Ganglionic level infarction (caudate, lentiform nuclei, internal capsule, insula, M1-M3 cortex): 6 - Supraganglionic infarction (M4-M6 cortex): 3 Total score (0-10 with 10 being normal): 9 IMPRESSION: 1. No acute hemorrhage. 2. Questionable, partial loss of left insular ribbon. Aspects is 9 at worst. 3. Remote left midbrain infarct. Progressive cerebellar atrophy since 2012. Electronically Signed   By: Marnee Spring M.D.   On: 10/04/2017 09:00    Procedures Procedures (including critical care time)  Medications Ordered in ED Medications - No data to display   Initial Impression / Assessment and Plan / ED Course  I have reviewed the triage vital signs and the nursing notes.  Pertinent labs & imaging results that were available during my care of the patient were reviewed by me and considered in my medical decision making (see chart for details).     WORTH KOBER is a 71 y.o. male with a past medical history significant for hypertension, diabetes, hyperlipidemia, CAD with CABG, and prior stroke who presents is a code stroke for speech abnormality beginning at 7:40 AM this morning. Patient is a complete by family reports that patient had eaten breakfast and was talking with family when he started stuttering, having slurred speech, and having difficulty getting what he wanted to say out. They report this is similar to prior strokes. They say the patient has chronic right-sided symptoms of weakness that are unchanged from prior. Patient was quickly assessed by me and airway was felt to be clear. Patient taken to the  CT scanner for evaluation.  Patient denied preceding symptoms over the last few days. Family denies recent fevers, chills, chest pain, shortness breath, nausea and vomiting, conservation, diarrhea, dysuria.  Patient examined by me. Exam revealed right upper extremity ataxia. Mild right facial droop. Symmetric grip strength. Mild right lower extremity weakness. All of these are unchanged compared to prior per patient and family. Speech was clear on my exam. Lungs clear. Chest nontender. Patient overall appears well.  Patient's EKG appeared unchanged from prior. Laboratory testing returned grossly unremarkable. Glucose was not low.  Anticipate neurology recommendations for further management. Anticipate admission for TIA versus stroke symptoms.  Neurology recommended admission. Initially, patient reports that he wanted to be discharged home AGAINST MEDICAL ADVICE. While preparing his paperwork and speaking  with family, patient had a change of heart and decided that he will stay in the hospital and get admitted for further stroke workup.  Hospitalist team will be called for admission.   Final Clinical Impressions(s) / ED Diagnoses   Final diagnoses:  Slurred speech    Clinical Impression: 1. Slurred speech     Disposition: Admit to Hospitalist service    Hazley Dezeeuw, Canary Brim, MD 10/04/17 [phone]

## 2017-10-04 NOTE — ED Notes (Signed)
Pt was requesting to go home however wife states he is still having moments where his speech sounds worse and she doesn't feel comfortable with pt going home.pt  is agreeable to stay at this time. tegler made aware and called hospitalist for admission.

## 2017-10-04 NOTE — Code Documentation (Signed)
71yo male arriving to [facility] via [facility] at [phone].  Patient from home where he woke up and ate cereal.  He spoke to his grandson at that time and was at baseline per family.  His granddaughter began talking to him and when he responded his speech was garbled.  LKW 0740 per family.  Patient with h/o multiple strokes in 2011 per family.  Patient report RUE spasticity and residual right sided deficits at baseline.  Patient hypertensive and reports missing his medications last night.  Stroke team notified of code stroke by ED staff and to the bedside.  Patient in CT.  CT completed.  NIHSS 5, see documentation for details and code stroke times.  Patient with mild dysarthria, right facial droop, RUE ataxia, RLE drift and RLE decreased sensation on exam.  Symptoms are mild and consistent with previous deficits except dysarthria which patient and family feel are resolving.  Patient is too mild to treat with tPA at this time, however, patient remains in the window to treat with tPA until 1210 should symptoms worsen.  Patient to be admitted.  Bedside handoff with ED RN Shanda Bumps.

## 2017-10-04 NOTE — Consult Note (Signed)
Reason for Consult: Code Stroke Referring Physician: ER  [name] is an 71 y.o. male.  HPI: Wife is at bedside.  Patient with history of hypertension and high cholesterol developed slurred speech at 7:40 am after breakfast.  He is back to baseline now.  He did not take some of his BP meds last night.  His BP was quite high in the ER.  CT Brain shows old left midbrain and right cerebellar infarcts.  There is a questionable hypodensity in the left insular area, but may be artifact.  No  Blood.    Past Medical History:  Diagnosis Date  . Arthritis    Gout  . Coronary artery disease   . Diabetes mellitus   . Gout   . Myocardial infarction (HCC) 2011  . Peripheral vascular disease (HCC)   . Stroke [facility]) feb 2012   complication of cath    Past Surgical History:  Procedure Laterality Date  . CARDIAC CATHETERIZATION     stent done feb 2012  . CORONARY ARTERY BYPASS GRAFT    . FEMORAL BYPASS Left Sept. 30, 2011   Left Fem to above knee Pop Artery BPG  . VASCULAR SURGERY      Family History  Problem Relation Age of Onset  . Heart disease Mother   . Heart disease Father   . Diabetes Father   . Heart attack Father     Social History:  reports that he has quit smoking. He quit after 40.00 years of use. He has never used smokeless tobacco. He reports that he does not drink alcohol or use drugs.  Allergies: No Known Allergies  Prior to Admission medications   Medication Sig Start Date End Date Taking? Authorizing Provider  allopurinol (ZYLOPRIM) 100 MG tablet Take 100 mg by mouth daily.    Kirsteins, Victorino Sparrow, MD  aspirin 325 MG tablet Take 325 mg by mouth daily.    [provider]  atenolol (TENORMIN) 50 MG tablet Take 0.5 tablets (25 mg total) by mouth 2 (two) times daily. 11/10/13   Lyn Records, MD  atorvastatin (LIPITOR) 40 MG tablet Take 1 tablet (40 mg total) by mouth daily. 03/22/15   Marvel Plan, MD  glipiZIDE (GLUCOTROL) 5 MG tablet Take 2.5 mg by mouth 2  (two) times daily before a meal.     [provider]  lisinopril (PRINIVIL,ZESTRIL) 2.5 MG tablet Take 2.5 mg by mouth daily.     [provider]  nitroGLYCERIN (NITROSTAT) 0.4 MG SL tablet Place 1 tablet (0.4 mg total) under the tongue every 5 (five) minutes as needed for chest pain. 07/19/14   Lyn Records, MD  tiZANidine (ZANAFLEX) 2 MG tablet TAKE 1 TABLET TWICE A DAY BEFORE A MEAL 07/19/15   Kirsteins, Victorino Sparrow, MD    Medications: Prior to Admission:  (Not in a hospital admission)  Results for orders placed or performed during the hospital encounter of 10/04/17 (from the past 48 hour(s))  CBG monitoring, ED     Status: Abnormal   Collection Time: 10/04/17  8:43 AM  Result Value Ref Range   Glucose-Capillary 162 (H) 65 - 99 mg/dL   Comment 1 Notify RN    Comment 2 Document in Chart   Protime-INR     Status: None   Collection Time: 10/04/17  8:45 AM  Result Value Ref Range   Prothrombin Time 14.3 11.4 - 15.2 seconds   INR 1.12   APTT     Status: None  Collection Time: 10/04/17  8:45 AM  Result Value Ref Range   aPTT 30 24 - 36 seconds  CBC     Status: None   Collection Time: 10/04/17  8:45 AM  Result Value Ref Range   WBC 6.6 4.0 - 10.5 K/uL   RBC 5.01 4.22 - 5.81 MIL/uL   Hemoglobin 15.5 13.0 - 17.0 g/dL   HCT 98.1 19.1 - 47.8 %   MCV 92.4 78.0 - 100.0 fL   MCH 30.9 26.0 - 34.0 pg   MCHC 33.5 30.0 - 36.0 g/dL   RDW 29.5 62.1 - 30.8 %   Platelets 202 150 - 400 K/uL  Differential     Status: None   Collection Time: 10/04/17  8:45 AM  Result Value Ref Range   Neutrophils Relative % 60 %   Neutro Abs 3.9 1.7 - 7.7 K/uL   Lymphocytes Relative 30 %   Lymphs Abs 2.0 0.7 - 4.0 K/uL   Monocytes Relative 7 %   Monocytes Absolute 0.5 0.1 - 1.0 K/uL   Eosinophils Relative 3 %   Eosinophils Absolute 0.2 0.0 - 0.7 K/uL   Basophils Relative 0 %   Basophils Absolute 0.0 0.0 - 0.1 K/uL  I-stat troponin, ED     Status: None   Collection Time: 10/04/17  8:48 AM   Result Value Ref Range   Troponin i, poc 0.00 0.00 - 0.08 ng/mL   Comment 3            Comment: Due to the release kinetics of cTnI, a negative result within the first hours of the onset of symptoms does not rule out myocardial infarction with certainty. If myocardial infarction is still suspected, repeat the test at appropriate intervals.   I-Stat Chem 8, ED     Status: Abnormal   Collection Time: 10/04/17  8:49 AM  Result Value Ref Range   Sodium 141 135 - 145 mmol/L   Potassium 3.9 3.5 - 5.1 mmol/L   Chloride 103 101 - 111 mmol/L   BUN 11 6 - 20 mg/dL   Creatinine, Ser 6.57 0.61 - 1.24 mg/dL   Glucose, Bld 846 (H) 65 - 99 mg/dL   Calcium, Ion 9.62 (L) 1.15 - 1.40 mmol/L   TCO2 26 22 - 32 mmol/L   Hemoglobin 17.0 13.0 - 17.0 g/dL   HCT 95.2 84.1 - 32.4 %    Ct Head Code Stroke Wo Contrast  Result Date: 10/04/2017 CLINICAL DATA:  Code stroke.  Aphasia EXAM: CT HEAD WITHOUT CONTRAST TECHNIQUE: Contiguous axial images were obtained from the base of the skull through the vertex without intravenous contrast. COMPARISON:  Head CT 01/29/2011 FINDINGS: Brain: Questionable loss of insular ribbon anteriorly and inferiorly. No acute hemorrhage, hydrocephalus, or masslike finding. Remote infarct of the left midbrain and upper pons. Small remote left cerebellar infarct. The cerebellum has become progressively atrophic since 2012. Vascular: Atherosclerotic calcification. No definite hyperdense vessel. Skull: No acute or aggressive finding Sinuses/Orbits: No pathologic finding.  Right cataract resection. Other: These results were called by telephone at the time of interpretation on 10/04/2017 at 8:58 am to Dr. Lynden Oxford , who verbally acknowledged these results. ASPECTS [facility] Stroke Program Early CT Score) -left hemisphere - Ganglionic level infarction (caudate, lentiform nuclei, internal capsule, insula, M1-M3 cortex): 6 - Supraganglionic infarction (M4-M6 cortex): 3 Total score (0-10  with 10 being normal): 9 IMPRESSION: 1. No acute hemorrhage. 2. Questionable, partial loss of left insular ribbon. Aspects is 9 at worst. 3. Remote  left midbrain infarct. Progressive cerebellar atrophy since 2012. Electronically Signed   By: Marnee Spring M.D.   On: 10/04/2017 09:00    ROS Blood pressure (!) 174/102, pulse 77, temperature 98.2 F (36.8 C), resp. rate (!) 24, SpO2 96 %. Neurologic Examination:  Awake, alert, fully oriented. Mild dysarthria - baseline Fluent; comprehension, naming, repetition - intact. Mild right facial droop. RUE pronator drift; otherwise 5/5 strength.   No babinski. No hoffman's. FTN right dysmetria.     Assessment/Plan:  Suspect mild hypertensive encephalopathy due to medication non-compliance.  Currently back at baseline.  Not a IV tPA candidate due to back at baseline.  I recommended admission for BP control, but patient wants to leave AMA.    Weston Settle, MD 10/04/2017, 9:16 AM

## 2017-10-04 NOTE — Discharge Instructions (Signed)
You are leaving AGAINST MEDICAL ADVICE as the neurology team is recommending admission for further workup and management of your speech abnormality concerning for either a TIA or subtle stroke. You appeared to clearly understand the risks of leaving including stroke, worsening symptoms, or death. Please follow-up with your primary care physician and your neurologist for reassessment. If any symptoms begin to change or worsen, please return immediately to the nearest emergency department.

## 2017-10-04 NOTE — ED Triage Notes (Signed)
Pt arrives with [facility] as a code stroke after his wife noticed this am at 0740 he started slurring his words and having issues with getting words out. Pt has previous stroke that has some residual right sided weakness from previous stroke. Pt is alert and ox4. Had mild facial droop wife states his normal for him.

## 2017-10-04 NOTE — ED Notes (Signed)
Report called to rn on 5 m 

## 2017-10-04 NOTE — H&P (Signed)
History and Physical  [name]:[id number] DOB: 1946/06/27 DOA: 10/04/2017  Referring physician: Dr Rush Landmark PCP: Georgann Housekeeper, MD  Outpatient Specialists:   Patient coming from: Home & is able to ambulate with a walker  Chief Complaint: Slurred speech  HPI: [name] is a 71 y.o. male with medical history significant for CAD, HTN, HLD, DM, PVD, previous CVA in 2012 presents to the ED this am c/o slurred speech + R facial droop. Hx obtained from patient, wife and daughter. Pt was noted by daughter around 7:40am after daughter noted pt had slurred shortly after breakfast. A mild right facial droop was also noted by EMS upon arrival. Pt denied any drooling, headache, significant extremity weakness (although wife reports an abnormal gait, but denies any worsening), denies any numbness, dizziness, chest pain, SOB, abdominal pain, N/V/D/C, fever/chills, syncope. Of note, pt has a hx of CVA in 2012 which occurred shortly after PCI. Pt denied any other episode since 2012. Pt compliant with his medications, although didn't get to take his am meds today.   Family at bedside, discussed plan of care with family, verbalized understanding  ED Course:  In the ED, code stroke was activated. NIHSS-5. No treatment with tPA as symptoms were resolving. CT scan showed no acute hemorrhage. ?Questionable, partial loss of left insular ribbon. Remote left midbrain infarct. Progressive cerebellar atrophy since 2012. Neurology on board, rec admission   Review of Systems: As per HPI above, otherwise negative   Past Medical History:  Diagnosis Date  . Arthritis    Gout  . Coronary artery disease   . Diabetes mellitus   . Gout   . Myocardial infarction (HCC) 2011  . Peripheral vascular disease (HCC)   . Stroke [facility]) feb 2012   complication of cath   Past Surgical History:  Procedure Laterality Date  . CARDIAC CATHETERIZATION     stent done feb 2012  . CORONARY ARTERY BYPASS GRAFT      . FEMORAL BYPASS Left Sept. 30, 2011   Left Fem to above knee Pop Artery BPG  . VASCULAR SURGERY      Social History:  reports that he has quit smoking. He quit after 40.00 years of use. He has never used smokeless tobacco. He reports that he does not drink alcohol or use drugs.   No Known Allergies  Family History  Problem Relation Age of Onset  . Heart disease Mother   . Heart disease Father   . Diabetes Father   . Heart attack Father    . Stroke in multiple family members as per pt  Prior to Admission medications   Medication Sig Start Date End Date Taking? Authorizing Provider  allopurinol (ZYLOPRIM) 100 MG tablet Take 100 mg by mouth daily.    Kirsteins, Victorino Sparrow, MD  aspirin 325 MG tablet Take 325 mg by mouth daily.    [provider]  atenolol (TENORMIN) 50 MG tablet Take 0.5 tablets (25 mg total) by mouth 2 (two) times daily. 11/10/13   Lyn Records, MD  atorvastatin (LIPITOR) 40 MG tablet Take 1 tablet (40 mg total) by mouth daily. 03/22/15   Marvel Plan, MD  glipiZIDE (GLUCOTROL) 5 MG tablet Take 2.5 mg by mouth 2 (two) times daily before a meal.     [provider]  lisinopril (PRINIVIL,ZESTRIL) 2.5 MG tablet Take 2.5 mg by mouth daily.     [provider]  nitroGLYCERIN (NITROSTAT) 0.4 MG SL tablet Place 1 tablet (0.4 mg total)  under the tongue every 5 (five) minutes as needed for chest pain. 07/19/14   Lyn Records, MD  tiZANidine (ZANAFLEX) 2 MG tablet TAKE 1 TABLET TWICE A DAY BEFORE A MEAL 07/19/15   Kirsteins, Victorino Sparrow, MD    Physical Exam: BP (!) 166/103   Pulse 60   Temp 98.2 F (36.8 C)   Resp (!) 22   SpO2 96%    Eyes:PERRL, lids and conjunctivae normal, very mild facial droop ENMT:Mucous membranes are moist. Posterior pharynx clear of any exudate or lesions.Normal dentition.  Neck:normal, supple Respiratory:clear to auscultation bilaterally, no wheezing, no crackles. Normal respiratory effort. No accessory muscle use.   Cardiovascular:Regular rate and rhythm, no murmurs / rubs / gallops. No extremity edema. 2+ pedal pulses. No carotid bruits.  Abdomen:no tenderness, no masses palpated. No hepatosplenomegaly. Bowel sounds positive.  Musculoskeletal:no clubbing / cyanosis. No joint deformity upper and lower extremities. Good ROM, no contractures. Normal muscle tone.  Skin:no rashes, lesions, ulcers. No induration Neurologic:CN 2-12 grossly intact. Sensation intact, DTR normal. RUE pronator drip, Strength 5/5 in all upper and lower extremity  Psychiatric:Normal judgment and insight. Alert and oriented x 3. Normal mood.            Labs on Admission:  Basic Metabolic Panel:  Recent Labs Lab 10/04/17 0845 10/04/17 0849  NA 138 141  K 3.9 3.9  CL 104 103  CO2 26  --   GLUCOSE 168* 170*  BUN 9 11  CREATININE 1.22 1.10  CALCIUM 8.6*  --    Liver Function Tests:  Recent Labs Lab 10/04/17 0845  AST 27  ALT 25  ALKPHOS 77  BILITOT 1.0  PROT 6.7  ALBUMIN 3.4*   No results for input(s): LIPASE, AMYLASE in the last 168 hours. No results for input(s): AMMONIA in the last 168 hours. CBC:  Recent Labs Lab 10/04/17 0845 10/04/17 0849  WBC 6.6  --   NEUTROABS 3.9  --   HGB 15.5 17.0  HCT 46.3 50.0  MCV 92.4  --   PLT 202  --    Cardiac Enzymes: No results for input(s): CKTOTAL, CKMB, CKMBINDEX, TROPONINI in the last 168 hours.  BNP (last 3 results) No results for input(s): BNP in the last 8760 hours.  ProBNP (last 3 results) No results for input(s): PROBNP in the last 8760 hours.  CBG:  Recent Labs Lab 10/04/17 0843  GLUCAP 162*    Radiological Exams on Admission: Ct Head Code Stroke Wo Contrast  Result Date: 10/04/2017 CLINICAL DATA:  Code stroke.  Aphasia EXAM: CT HEAD WITHOUT CONTRAST TECHNIQUE: Contiguous axial images were obtained from the base of the skull through the vertex without intravenous contrast. COMPARISON:  Head CT 01/29/2011 FINDINGS: Brain: Questionable  loss of insular ribbon anteriorly and inferiorly. No acute hemorrhage, hydrocephalus, or masslike finding. Remote infarct of the left midbrain and upper pons. Small remote left cerebellar infarct. The cerebellum has become progressively atrophic since 2012. Vascular: Atherosclerotic calcification. No definite hyperdense vessel. Skull: No acute or aggressive finding Sinuses/Orbits: No pathologic finding.  Right cataract resection. Other: These results were called by telephone at the time of interpretation on 10/04/2017 at 8:58 am to Dr. Lynden Oxford , who verbally acknowledged these results. ASPECTS [facility] Stroke Program Early CT Score) -left hemisphere - Ganglionic level infarction (caudate, lentiform nuclei, internal capsule, insula, M1-M3 cortex): 6 - Supraganglionic infarction (M4-M6 cortex): 3 Total score (0-10 with 10 being normal): 9 IMPRESSION: 1. No acute hemorrhage. 2. Questionable, partial loss of  left insular ribbon. Aspects is 9 at worst. 3. Remote left midbrain infarct. Progressive cerebellar atrophy since 2012. Electronically Signed   By: Marnee Spring M.D.   On: 10/04/2017 09:00    EKG: Independently reviewed, SR, old inf infarct with Q waves in inf leads, unchanged from previous  Assessment/Plan Present on Admission:  #) Slurred speech with mild R facial droop, likely TIA, r/o Stroke: - CT head showed no acute hemorrhage. ?Questionable, partial loss of left insular ribbon. Remote left midbrain infarct. Progressive cerebellar atrophy since 2012. - Stroke work up ongoing: MRI pending, carotid doppler, ECHO all pending - A1c, lipid panel, TSH in am - Currently NPO, bedside swallow eval before feeding. PT/OT, speech therapy - Continue home ASA  daily, lipitor  daily - Neurology on board: recommend controlling BP due to possible hypertensive encephalopathy. Will go ahead and manage pt as a stroke work    up for now, if MRI is negative will restart  BP meds - Monitor  closely  #) HTN: - Uncontrolled, BP range 160s-170s/90-105 - Will allow permissive HTN, until stroke ruled out. Avoid hypotension - Held PTA lisinopril 2.5mg , atenolol  BID - Monitor closely  #) DM type 2: - A1c pending, ISS, accu-checks, hypoglycemic protocol - Held home glipizide 2.5mg  BID, may restarting if tolerating orally  #) HLD: - Lipid panel in am - Continue lipitor  #) CAD (coronary artery disease) of artery bypass graft: - Hx of CABG, stents placed in 2012 - Denies any chest pain/pressure, no SOB - EKG as mentioned above, trop neg  #) Peripheral vascular disease s/p L fem-pop bypass in 2011 - Stable - Continue lipitor   DVT prophylaxis:  Enoxaparin  Code Status:  Full  Family Communication:  Spoke to wife and daughter in ED, present while taking history  Disposition Plan:  In observation, should be d/c by 10/05/17  Consults called: Neurology on board  Admission status:  Observation    Briant Cedar MD [facility] Pager [phone]  If 7PM-7AM, please contact night-coverage [email or web address] Password [email or web address]  10/04/2017, 3:07 PM

## 2017-10-04 NOTE — ED Notes (Signed)
Unsuccessful attempt to call report  c asll back in 10 minutes

## 2017-10-04 NOTE — ED Notes (Signed)
Family at bedside. 

## 2017-10-05 ENCOUNTER — Observation Stay (HOSPITAL_BASED_OUTPATIENT_CLINIC_OR_DEPARTMENT_OTHER): Payer: Medicare Other

## 2017-10-05 ENCOUNTER — Observation Stay (HOSPITAL_COMMUNITY): Payer: Medicare Other

## 2017-10-05 ENCOUNTER — Encounter (HOSPITAL_COMMUNITY): Payer: Self-pay

## 2017-10-05 DIAGNOSIS — I63233 Cerebral infarction due to unspecified occlusion or stenosis of bilateral carotid arteries: Secondary | ICD-10-CM | POA: Diagnosis not present

## 2017-10-05 DIAGNOSIS — I10 Essential (primary) hypertension: Secondary | ICD-10-CM

## 2017-10-05 DIAGNOSIS — I25709 Atherosclerosis of coronary artery bypass graft(s), unspecified, with unspecified angina pectoris: Secondary | ICD-10-CM

## 2017-10-05 DIAGNOSIS — I69359 Hemiplegia and hemiparesis following cerebral infarction affecting unspecified side: Secondary | ICD-10-CM | POA: Diagnosis not present

## 2017-10-05 DIAGNOSIS — E782 Mixed hyperlipidemia: Secondary | ICD-10-CM

## 2017-10-05 DIAGNOSIS — E1139 Type 2 diabetes mellitus with other diabetic ophthalmic complication: Secondary | ICD-10-CM

## 2017-10-05 DIAGNOSIS — I739 Peripheral vascular disease, unspecified: Secondary | ICD-10-CM

## 2017-10-05 DIAGNOSIS — I639 Cerebral infarction, unspecified: Secondary | ICD-10-CM | POA: Diagnosis not present

## 2017-10-05 DIAGNOSIS — I5030 Unspecified diastolic (congestive) heart failure: Secondary | ICD-10-CM

## 2017-10-05 DIAGNOSIS — G459 Transient cerebral ischemic attack, unspecified: Secondary | ICD-10-CM | POA: Diagnosis not present

## 2017-10-05 DIAGNOSIS — E1165 Type 2 diabetes mellitus with hyperglycemia: Secondary | ICD-10-CM

## 2017-10-05 LAB — GLUCOSE, CAPILLARY
GLUCOSE-CAPILLARY: 98 mg/dL (ref 65–99)
GLUCOSE-CAPILLARY: 136 mg/dL — AB (ref 65–99)
GLUCOSE-CAPILLARY: 118 mg/dL — AB (ref 65–99)
Glucose-Capillary: 136 mg/dL — ABNORMAL HIGH (ref 65–99)

## 2017-10-05 LAB — LIPID PANEL
CHOL/HDL RATIO: 4.5 ratio
CHOLESTEROL: 135 mg/dL (ref 0–200)
HDL: 30 mg/dL — AB (ref >40)
LDL Cholesterol: 88 mg/dL (ref 0–99)
Triglycerides: 87 mg/dL (ref <150)
VLDL: 17 mg/dL (ref 0–40)

## 2017-10-05 LAB — ECHOCARDIOGRAM COMPLETE
HEIGHTINCHES: 73 in
WEIGHTICAEL: 4201.09 [oz_av]

## 2017-10-05 LAB — BASIC METABOLIC PANEL
Anion gap: 7 (ref 5–15)
BUN: 7 mg/dL (ref 6–20)
CHLORIDE: 104 mmol/L (ref 101–111)
CO2: 27 mmol/L (ref 22–32)
CREATININE: 1.04 mg/dL (ref 0.61–1.24)
Calcium: 8.8 mg/dL — ABNORMAL LOW (ref 8.9–10.3)
Glucose, Bld: 95 mg/dL (ref 65–99)
POTASSIUM: 4.2 mmol/L (ref 3.5–5.1)
SODIUM: 138 mmol/L (ref 135–145)

## 2017-10-05 LAB — HEMOGLOBIN A1C
Hgb A1c MFr Bld: 6.3 % — ABNORMAL HIGH (ref 4.8–5.6)
MEAN PLASMA GLUCOSE: 134.11 mg/dL

## 2017-10-05 LAB — TSH: TSH: 1.439 u[IU]/mL (ref 0.350–4.500)

## 2017-10-05 MED ORDER — IOPAMIDOL (ISOVUE-370) INJECTION 76%
INTRAVENOUS | Status: AC
  Administered 2017-10-05: 50 mL
  Filled 2017-10-05: qty 50

## 2017-10-05 MED ORDER — CLOPIDOGREL BISULFATE 75 MG PO TABS
75.0000 mg | ORAL_TABLET | Freq: Every day | ORAL | Status: DC
  Administered 2017-10-05 – 2017-10-06 (×2): 75 mg via ORAL
  Filled 2017-10-05 (×2): qty 1

## 2017-10-05 NOTE — Progress Notes (Signed)
OT Cancellation Note  Patient Details Name: ABDULRAHEEM PINEO MRN: [id number] DOB: 10/26/1946   Cancelled Treatment:    Reason Eval/Treat Not Completed: Patient at procedure or test/ unavailable. OT will continue to follow for evaluation as schedule allows.  Evern Bio Sharnell Knight 10/05/2017, 10:27 AM  Sherryl Manges OTR/L [phone]

## 2017-10-05 NOTE — Evaluation (Signed)
Occupational Therapy Evaluation and Discharge Patient Details Name: [name] MRN: [id number] DOB: [birth year] Today's Date: 10/05/2017    History of Present Illness 71 y.o. male with medical history significant for CAD, HTN, HLD, DM, PVD, previous CVA in 2012 presents to the ED this am c/o slurred speech + R facial droop. Hx obtained from patient, wife and daughter. CT head on 10/04/17 indicated Remote left midbrain infarct. Progressive cerebellar atrophy since 2012.   Clinical Impression   PTA Pt was modified independent in ADL and mobility at wc level at home and RW for community. Pt goes to aquatic therapy and last PT session at the [place] was last summer. Pt requires extra time for ADL but able to complete independently - has excellent home set up to meet needs. Pt is at baseline for ADL and functional transfers at this time. Pt mentioned again and again goals for walking and driving. OT educated Pt that he would need to get cleared from his eye MD first before even thinking about driving, and that PT would focus on ambulation/mobility. Pt was very very pleasant and has very supportive family. OT to sign off as Pt does not have any further questions or concerns about OT. Thank you for the opportunity to serve this patient.      Follow Up Recommendations  No OT follow up    Equipment Recommendations  None recommended by OT (Pt has appropriate DME)    Recommendations for Other Services       Precautions / Restrictions Precautions Precautions: Fall Restrictions Weight Bearing Restrictions: No      Mobility Bed Mobility               General bed mobility comments: Pt sitting EOB when OT entered the room  Transfers Overall transfer level: Needs assistance Equipment used: Rolling walker (2 wheeled) Transfers: Sit to/from Stand Sit to Stand: Supervision         General transfer comment: supervision for safety    Balance Overall balance assessment: Needs  assistance Sitting-balance support: No upper extremity supported;Feet supported Sitting balance-Leahy Scale: Normal Sitting balance - Comments: sitting EOB   Standing balance support: Single extremity supported;Bilateral upper extremity supported;During functional activity Standing balance-Leahy Scale: Fair Standing balance comment: requires BUE support during dynamic movement                           ADL either performed or assessed with clinical judgement   ADL Overall ADL's : At baseline                                       General ADL Comments: Pt able to ambulate to bathroom with RW, perform transfers, doff/don socks sitting EOB, Both him and his wife feel he is back at baseline.      Vision Baseline Vision/History: Wears glasses Wears Glasses: At all times Additional Comments: Pt is scheduled to go see an eye doctor soon and would really like to drive. OT told him if that is really his end goal that he has to get clearance from his eye MD.      Perception     Praxis      Pertinent Vitals/Pain Pain Assessment: No/denies pain     Hand Dominance Right (but has learned to be left hand dominant)   Extremity/Trunk Assessment Upper Extremity Assessment Upper Extremity Assessment:  Overall WFL for tasks assessed (Baseline ataxia and weakness in RUE)   Lower Extremity Assessment Lower Extremity Assessment: Defer to PT evaluation       Communication Communication Communication: Expressive difficulties   Cognition Arousal/Alertness: Awake/alert Behavior During Therapy: WFL for tasks assessed/performed Overall Cognitive Status: Within Functional Limits for tasks assessed                                     General Comments  Pt normally has brace he wears on LLE for foot drop; Pt's wife and daughters present during session    Exercises     Shoulder Instructions      Home Living Family/patient expects to be discharged to::  Private residence Living Arrangements: Spouse/significant other;Children Available Help at Discharge: Family;Friend(s);Available 24 hours/day Type of Home: House       Home Layout: Two level;Able to live on main level with bedroom/bathroom Alternate Level Stairs-Number of Steps: flight   Bathroom Shower/Tub: Arts development officer Toilet: Handicapped height Bathroom Accessibility: Yes   Home Equipment: Environmental consultant - 4 wheels;Walker - 2 wheels;Cane - quad;Shower seat - built in;Grab bars - toilet;Grab bars - tub/shower;Hand held shower head;Wheelchair - manual   Additional Comments: Pt goes to aquatic therapy  Lives With: Spouse    Prior Functioning/Environment Level of Independence: Independent with assistive device(s)        Comments: uses a RW for short community ambulation but uses a wc primarily at home, can bath and dress self with increased time        OT Problem List:        OT Treatment/Interventions:      OT Goals(Current goals can be found in the care plan section) Acute Rehab OT Goals Patient Stated Goal: to walk again and drive again OT Goal Formulation: With patient Time For Goal Achievement: 10/19/17 Potential to Achieve Goals: Fair  OT Frequency:     Barriers to D/C:            Co-evaluation              AM-PAC PT "6 Clicks" Daily Activity     Outcome Measure Help from another person eating meals?: None Help from another person taking care of personal grooming?: None Help from another person toileting, which includes using toliet, bedpan, or urinal?: A Little Help from another person bathing (including washing, rinsing, drying)?: None Help from another person to put on and taking off regular upper body clothing?: None Help from another person to put on and taking off regular lower body clothing?: None 6 Click Score: 23   End of Session Equipment Utilized During Treatment: Gait belt;Rolling walker Nurse Communication: Mobility  status  Activity Tolerance: Patient tolerated treatment well Patient left: in bed;with call bell/phone within reach;with family/visitor present (sitting EOB)                   Time: 2956-2130 OT Time Calculation (min): 29 min Charges:  OT General Charges $OT Visit: 1 Visit OT Evaluation $OT Eval Moderate Complexity: 1 Mod OT Treatments $Self Care/Home Management : 8-22 mins G-Codes: OT G-codes **NOT FOR INPATIENT CLASS** Functional Assessment Tool Used: AM-PAC 6 Clicks Daily Activity Functional Limitation: Self care Self Care Current Status (Q6578): At least 1 percent but less than 20 percent impaired, limited or restricted Self Care Goal Status (I6962): At least 1 percent but less than 20 percent impaired, limited  or restricted Self Care Discharge Status [phone]): At least 1 percent but less than 20 percent impaired, limited or restricted   Sherryl Manges OTR/L [phone] Evern Bio Davison Ohms 10/05/2017, 4:30 PM

## 2017-10-05 NOTE — Progress Notes (Signed)
  Echocardiogram 2D Echocardiogram has been performed.  Roosvelt Maser F 10/05/2017, 1:58 PM

## 2017-10-05 NOTE — Progress Notes (Signed)
STROKE TEAM PROGRESS NOTE   SUBJECTIVE (INTERVAL HISTORY) His wife and daughters are at the bedside.  Overall he feels his condition is completely resolved. Patient and daughter stated that he had a sudden onset slurry speech, resolved in 5 minutes. EMS called, patient sent to ER. However, patient hadseveral similar episodes after that, all resolved. Patient now at his baseline. Pt is on ASA at home.   OBJECTIVE Temp:  [97.4 F (36.3 C)-98.7 F (37.1 C)] 98.1 F (36.7 C) (10/13 1432) Pulse Rate:  [67-88] 88 (10/13 1432) Cardiac Rhythm: Normal sinus rhythm (10/13 0700) Resp:  [16-20] 20 (10/13 1432) BP: (128-170)/(70-94) 128/88 (10/13 1432) SpO2:  [93 %-96 %] 94 % (10/13 1432) Weight:  [262 lb 9.1 oz (119.1 kg)] 262 lb 9.1 oz (119.1 kg) (10/13 0000)   Recent Labs Lab 10/04/17 0843 10/04/17 2145 10/05/17 0644  GLUCAP 162* 87 98    Recent Labs Lab 10/04/17 0845 10/04/17 0849 10/05/17 0523  NA 138 141 138  K 3.9 3.9 4.2  CL 104 103 104  CO2 26  --  27  GLUCOSE 168* 170* 95  BUN CREATININE 1.22 1.10 1.04  CALCIUM 8.6*  --  8.8*    Recent Labs Lab 10/04/17 0845  AST 27  ALT 25  ALKPHOS 77  BILITOT 1.0  PROT 6.7  ALBUMIN 3.4*    Recent Labs Lab 10/04/17 0845 10/04/17 0849  WBC 6.6  --   NEUTROABS 3.9  --   HGB 15.5 17.0  HCT 46.3 50.0  MCV 92.4  --   PLT 202  --    No results for input(s): CKTOTAL, CKMB, CKMBINDEX, TROPONINI in the last 168 hours.  Recent Labs  10/04/17 0845  LABPROT 14.3  INR 1.12   No results for input(s): COLORURINE, LABSPEC, PHURINE, GLUCOSEU, HGBUR, BILIRUBINUR, KETONESUR, PROTEINUR, UROBILINOGEN, NITRITE, LEUKOCYTESUR in the last 72 hours.  Invalid input(s): APPERANCEUR     Component Value Date/Time   CHOL 135 10/05/2017 0523   CHOL 166 03/18/2015 1144   TRIG 87 10/05/2017 0523   HDL 30 (L) 10/05/2017 0523   HDL 37 (L) 03/18/2015 1144   CHOLHDL 4.5 10/05/2017 0523   VLDL 17 10/05/2017 0523   LDLCALC 88  10/05/2017 0523   LDLCALC 100 (H) 03/18/2015 1144   Lab Results  Component Value Date   HGBA1C 6.3 (H) 10/05/2017   No results found for: LABOPIA, COCAINSCRNUR, LABBENZ, AMPHETMU, THCU, LABBARB  No results for input(s): ETH in the last 168 hours.  I have personally reviewed the radiological images below and agree with the radiology interpretations.  Ct Angio Head W Or Wo Contrast  Result Date: 10/05/2017 CLINICAL DATA:  Follow-up stroke EXAM: CT ANGIOGRAPHY HEAD AND NECK TECHNIQUE: Multidetector CT imaging of the head and neck was performed using the standard protocol during bolus administration of intravenous contrast. Multiplanar CT image reconstructions and MIPs were obtained to evaluate the vascular anatomy. Carotid stenosis measurements (when applicable) are obtained utilizing NASCET criteria, using the distal internal carotid diameter as the denominator. CONTRAST:  50 cc Isovue 370 intravenous COMPARISON:  Head CT from yesterday FINDINGS: CT HEAD FINDINGS Brain: Insular ribbon loss on the left that was questioned yesterday is not seen today. No definite infarct. No acute hemorrhage. Remote left midbrain infarct. Vascular: See below Skull: No acute or aggressive finding Sinuses: Negative Orbits: Right cataract resection.  No acute finding Review of the MIP images confirms the above findings CTA NECK FINDINGS Aortic arch: Atherosclerosis. Three vessel  branching. Partially seen changes of CABG with patency of the visible saphenous vein graft. Right carotid system: Mild predominately calcified plaque at the ICA bulb without stenosis. No ulceration or dissection. Tortuosity with ICA looping. Left carotid system: Bulky plaque at the ICA bulb with approximately 20% stenosis. No ulceration. Negative for dissection. Plaque without stenosis at the distal ICA. Vertebral arteries: Proximal subclavian atherosclerosis without flow limiting stenosis. Moderate narrowing of the left vertebral artery origin due  to atheromatous plaque. There are additional atheromatous calcifications in the left V1 segment. Moderate narrowing of the right vertebral artery within the C1 transverse foramen, best seen on coronal reformats. Given the limited extent and absent neck pain history this is likely atheromatous disease. Moderate narrowing of the right vertebral artery as it enters the transverse foramen. Skeleton: No acute or aggressive finding. Other neck: No incidental mass or inflammation. Upper chest: No acute finding.  Mild centrilobular emphysema. Review of the MIP images confirms the above findings CTA HEAD FINDINGS Anterior circulation: Heavy calcified atheromatous plaque at both siphons with high-grade bilateral mid cavernous narrowing. Quantification is limited due to the degree of calcified plaque blooming. Hypoplastic left A1 segment, there is an azygos type A2 segment. Atherosclerotic irregularity of bilateral MCA branches without branch occlusion. Negative for aneurysm. Posterior circulation: Fairly symmetric vertebral arteries. Bilateral V4 atherosclerosis with moderate left and advanced right stenosis at the vertebrobasilar junction. High-grade proximal basilar stenosis with extrinsic constriction. This is where filling defect was seen on catheter angiography in 2012. Severe left P1 segment stenosis with nonvisualized vessel. Bilateral atheromatous irregularity of PCA branches. Venous sinuses: Patent on the delayed phase. Anatomic variants: None significant. Delayed phase: No abnormal intracranial enhancement. Review of the MIP images confirms the above findings IMPRESSION: Head CT : 1. Questioned left insular infarct on prior head CT is not re- demonstrated. No acute finding. 2. Remote left midbrain and cerebellar infarcts. CTA neck: 1. Bilateral cervical carotid bifurcation atherosclerosis without flow limiting stenosis or ulceration. 2. Moderate narrowing at the origin of the left vertebral artery. 3. Moderate  narrowing of the right vertebral artery of V1/2 junction and C1. Intracranial CTA: 1. High-grade bilateral cavernous carotid stenosis. Quantification is limited due to degree of calcified plaque which obscures the lumen. 2. Severe stenosis at the right vertebrobasilar junction. Moderate narrowing at the left vertebrobasilar junction. 3. High-grade proximal basilar stenosis. A luminal filling defect was seen at this level on 2012 catheter angiography. 4. Severe left P1 segment stenosis with no visible flow over a short segment. Electronically Signed   By: Marnee Spring M.D.   On: 10/05/2017 10:43   Dg Chest 2 View  Result Date: 10/04/2017 CLINICAL DATA:  Possible stroke.  Slurred speech. EXAM: CHEST  2 VIEW COMPARISON:  Chest radiograph 09/11/2011 FINDINGS: There is cardiomegaly within obscured left retrocardiac region. No pneumothorax or sizable pleural effusion. No focal airspace consolidation or pulmonary edema. IMPRESSION: Cardiomegaly, unchanged, without focal consolidation or overt pulmonary edema. Electronically Signed   By: Deatra Robinson M.D.   On: 10/04/2017 22:27   Ct Angio Neck W Or Wo Contrast  Result Date: 10/05/2017 CLINICAL DATA:  Follow-up stroke EXAM: CT ANGIOGRAPHY HEAD AND NECK TECHNIQUE: Multidetector CT imaging of the head and neck was performed using the standard protocol during bolus administration of intravenous contrast. Multiplanar CT image reconstructions and MIPs were obtained to evaluate the vascular anatomy. Carotid stenosis measurements (when applicable) are obtained utilizing NASCET criteria, using the distal internal carotid diameter as the denominator. CONTRAST:  50  cc Isovue 370 intravenous COMPARISON:  Head CT from yesterday FINDINGS: CT HEAD FINDINGS Brain: Insular ribbon loss on the left that was questioned yesterday is not seen today. No definite infarct. No acute hemorrhage. Remote left midbrain infarct. Vascular: See below Skull: No acute or aggressive finding  Sinuses: Negative Orbits: Right cataract resection.  No acute finding Review of the MIP images confirms the above findings CTA NECK FINDINGS Aortic arch: Atherosclerosis. Three vessel branching. Partially seen changes of CABG with patency of the visible saphenous vein graft. Right carotid system: Mild predominately calcified plaque at the ICA bulb without stenosis. No ulceration or dissection. Tortuosity with ICA looping. Left carotid system: Bulky plaque at the ICA bulb with approximately 20% stenosis. No ulceration. Negative for dissection. Plaque without stenosis at the distal ICA. Vertebral arteries: Proximal subclavian atherosclerosis without flow limiting stenosis. Moderate narrowing of the left vertebral artery origin due to atheromatous plaque. There are additional atheromatous calcifications in the left V1 segment. Moderate narrowing of the right vertebral artery within the C1 transverse foramen, best seen on coronal reformats. Given the limited extent and absent neck pain history this is likely atheromatous disease. Moderate narrowing of the right vertebral artery as it enters the transverse foramen. Skeleton: No acute or aggressive finding. Other neck: No incidental mass or inflammation. Upper chest: No acute finding.  Mild centrilobular emphysema. Review of the MIP images confirms the above findings CTA HEAD FINDINGS Anterior circulation: Heavy calcified atheromatous plaque at both siphons with high-grade bilateral mid cavernous narrowing. Quantification is limited due to the degree of calcified plaque blooming. Hypoplastic left A1 segment, there is an azygos type A2 segment. Atherosclerotic irregularity of bilateral MCA branches without branch occlusion. Negative for aneurysm. Posterior circulation: Fairly symmetric vertebral arteries. Bilateral V4 atherosclerosis with moderate left and advanced right stenosis at the vertebrobasilar junction. High-grade proximal basilar stenosis with extrinsic  constriction. This is where filling defect was seen on catheter angiography in 2012. Severe left P1 segment stenosis with nonvisualized vessel. Bilateral atheromatous irregularity of PCA branches. Venous sinuses: Patent on the delayed phase. Anatomic variants: None significant. Delayed phase: No abnormal intracranial enhancement. Review of the MIP images confirms the above findings IMPRESSION: Head CT : 1. Questioned left insular infarct on prior head CT is not re- demonstrated. No acute finding. 2. Remote left midbrain and cerebellar infarcts. CTA neck: 1. Bilateral cervical carotid bifurcation atherosclerosis without flow limiting stenosis or ulceration. 2. Moderate narrowing at the origin of the left vertebral artery. 3. Moderate narrowing of the right vertebral artery of V1/2 junction and C1. Intracranial CTA: 1. High-grade bilateral cavernous carotid stenosis. Quantification is limited due to degree of calcified plaque which obscures the lumen. 2. Severe stenosis at the right vertebrobasilar junction. Moderate narrowing at the left vertebrobasilar junction. 3. High-grade proximal basilar stenosis. A luminal filling defect was seen at this level on 2012 catheter angiography. 4. Severe left P1 segment stenosis with no visible flow over a short segment. Electronically Signed   By: Marnee Spring M.D.   On: 10/05/2017 10:43   Ct Head Code Stroke Wo Contrast  Result Date: 10/04/2017 CLINICAL DATA:  Code stroke.  Aphasia EXAM: CT HEAD WITHOUT CONTRAST TECHNIQUE: Contiguous axial images were obtained from the base of the skull through the vertex without intravenous contrast. COMPARISON:  Head CT 01/29/2011 FINDINGS: Brain: Questionable loss of insular ribbon anteriorly and inferiorly. No acute hemorrhage, hydrocephalus, or masslike finding. Remote infarct of the left midbrain and upper pons. Small remote left cerebellar infarct. The  cerebellum has become progressively atrophic since 2012. Vascular:  Atherosclerotic calcification. No definite hyperdense vessel. Skull: No acute or aggressive finding Sinuses/Orbits: No pathologic finding.  Right cataract resection. Other: These results were called by telephone at the time of interpretation on 10/04/2017 at 8:58 am to Dr. Lynden Oxford , who verbally acknowledged these results. ASPECTS [facility] Stroke Program Early CT Score) -left hemisphere - Ganglionic level infarction (caudate, lentiform nuclei, internal capsule, insula, M1-M3 cortex): 6 - Supraganglionic infarction (M4-M6 cortex): 3 Total score (0-10 with 10 being normal): 9 IMPRESSION: 1. No acute hemorrhage. 2. Questionable, partial loss of left insular ribbon. Aspects is 9 at worst. 3. Remote left midbrain infarct. Progressive cerebellar atrophy since 2012. Electronically Signed   By: Marnee Spring M.D.   On: 10/04/2017 09:00    Carotid Doppler  Bilateral: 1-39% ICA stenosis. Vertebral artery flow is antegrade.  2D Echocardiogram  pending   PHYSICAL EXAM  Temp:  [97.4 F (36.3 C)-98.7 F (37.1 C)] 98.1 F (36.7 C) (10/13 1432) Pulse Rate:  [67-88] 88 (10/13 1432) Resp:  [16-20] 20 (10/13 1432) BP: (128-170)/(70-94) 128/88 (10/13 1432) SpO2:  [93 %-96 %] 94 % (10/13 1432) Weight:  [262 lb 9.1 oz (119.1 kg)] 262 lb 9.1 oz (119.1 kg) (10/13 0000)  General - Well nourished, well developed, in no apparent distress.  Ophthalmologic - Sharp disc margins OU.  Cardiovascular - Regular rate and rhythm with no murmur.  Mental Status -  Level of arousal and orientation to time, place, and person were intact. Language including expression, naming, repetition, comprehension was assessed and found intact. Fund of Knowledge was assessed and was intact.  Cranial Nerves II - XII - II - Visual field intact OU. III, IV, VI - Extraocular movements intact. V - Facial sensation intact bilaterally. VII - chronic right facial droop. VIII - Hearing & vestibular intact bilaterally. X -  Palate elevates symmetrically. XI - Chin turning & shoulder shrug intact bilaterally. XII - Tongue protrusion intact.  Motor Strength - The patient's strength was normal in LUE and LLE extremities, however chronic RUE 3/5 proximal and distal, 4+/5 RLE.  Bulk was normal and fasciculations were absent.   Motor Tone - Muscle tone was assessed at the neck and appendages and was increased on the RUE.  Reflexes - The patient's reflexes were 3+ in RUE and RLE and he had no pathological reflexes.  Sensory - Light touch, temperature/pinprick were assessed and were symmetrical.    Coordination - The patient had significant ataxia RUE, proportional to the weakness.  Tremor was absent.  Gait and Station - deferred due to safety concerns   ASSESSMENT/PLAN Mr. [name] is a 71 y.o. male with history of HTN, HLD, DM, CAD/MI s/p CABG 2011 and stenting 2012, stroke post cardiac cath in 2012 admitted for episodic slurred speech. Symptoms resolved.    TIA - ikely due to severe intracranial stenosis  Resultant: chronic right facial droop, right hemiparesis, no new neuro deficit  CT and CT repeat no acute finding  CTA head and neck bilateral high-grade stenosis cavernous ICAs, left P1 and bilateral VBJ stenosis, proximal basilar artery stenosis Similar findings as previous study in 2012  Carotid Doppler  unremarkable  2D Echo  pending  LDL 88  HgbA1c 6.3  Lovenox for VTE prophylaxis  Diet Carb Modified Fluid consistency: Thin; Room service appropriate? Yes   aspirin 325 mg daily prior to admission, now on aspirin 325 mg daily and clopidogrel 75 mg daily. Continue DAPT for  cardiac and stroke prevention.  Patient counseled to be compliant with his antithrombotic medications  Ongoing aggressive stroke risk factor management  Therapy recommendations:  pending  Disposition:  Pending  Intracranial stenosis  Previous MRA and current CTA head and neck showed multifocal intracranial  stenosis  Continue DAPT for stroke prevention  BP goal 130 to 150  History of stroke  01/2011 - admitted for angina s/p cardiac cath - developed right facial droop and slurred speech and right hemiparesis after procedure - MRI showed left cerebellum, right left pontine, right PCA/MCA, right thalamus embolic infarct - MRA showed basal artery severe stenosis - [place] showed severe posterior simulation disease - discharge with DAPT and statin  Following clinic 03/18/15 - continued on aspirin and Lipitor  Diabetes  HgbA1c 6.3 goal < 7.0  Controlled  Currently on glipizide  CBG monitoring  SSI  Hypertension  Home meds:   lisinopril Permissive hypertension (OK if <220/120) for 24-48 hours post stroke and then gradually normalized within 5-7 days. Currently on no BP meds  Stable on the high side  BP 130-150 due to severe intracranial stenosis  Hyperlipidemia  Home meds:  Lipitor 40   Currently on Lipitor 40  LDL 88, goal < 70  Continue statin at discharge  Other Stroke Risk Factors  Advanced age  Obesity, Body mass index is 34.64 kg/m.   Coronary artery disease status post CABG and stenting - follow-up with Dr. Katrinka Blazing  PVD  Other Active Problems  Chronic right hemiparesis, walk with walker   Hospital day # 0  Neurology will sign off. Please call with questions. Pt will follow up with Darrol Angel, NP, at [facility] in about 6 weeks. Thanks for the consult.   Marvel Plan, MD PhD Stroke Neurology 10/05/2017 4:22 PM    To contact Stroke Continuity provider, please refer to [email or web address]. After hours, contact General Neurology

## 2017-10-05 NOTE — Progress Notes (Addendum)
VASCULAR LAB PRELIMINARY  PRELIMINARY  PRELIMINARY  PRELIMINARY  Carotid duplex completed.    Preliminary report:  1-39% ICA plaquing.  Vertebral artery flow is antegrade.   Laron Angelini, RVT 10/05/2017, 10:16 AM

## 2017-10-05 NOTE — Evaluation (Signed)
Speech Language Pathology Evaluation Patient Details Name: [name] MRN: [id number] DOB: February 12, 1946 Today's Date: 10/05/2017 Time: 0901-0930 SLP Time Calculation (min) (ACUTE ONLY): 29 min  Problem List:  Patient Active Problem List   Diagnosis Date Noted  . Stroke (HCC) 10/04/2017  . DM (diabetes mellitus) (HCC) 03/18/2015  . Essential hypertension 03/18/2015  . HLD (hyperlipidemia) 03/18/2015  . Aftercare following surgery of the circulatory system 10/25/2014  . PVD (peripheral vascular disease) (HCC) 10/25/2014  . Occlusion and stenosis of carotid artery without mention of cerebral infarction 11/16/2013  . Aftercare following surgery of the circulatory system, NEC 10/19/2013  . Peripheral vascular disease, unspecified (HCC) 10/16/2012  . Venous insufficiency 02/19/2012  . CVA, old, hemiparesis (HCC) 02/18/2012  . CAD (coronary artery disease) of artery bypass graft 02/18/2012  . Cellulitis 02/17/2012  . HTN (hypertension) 02/17/2012  . 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 Arthritis    Gout  . Coronary artery disease   . Diabetes mellitus   . Gout   . Myocardial infarction (HCC) 2011  . Peripheral vascular disease (HCC)   . Stroke [facility]) feb 2012   complication of cath   Past Surgical History:  Past Surgical History:  Procedure Laterality Date  . CARDIAC CATHETERIZATION     stent done feb 2012  . CORONARY ARTERY BYPASS GRAFT    . FEMORAL BYPASS Left Sept. 30, 2011   Left Fem to above knee Pop Artery BPG  . VASCULAR SURGERY     HPI:  71 y.o. male with medical history significant for CAD, HTN, HLD, DM, PVD, previous CVA in 2012 presents to the ED this am c/o slurred speech + R facial droop. Hx obtained from patient, wife and daughter. Pt was noted by daughter around 7:40am after daughter noted pt had slurred shortly after breakfast. A mild  right facial droop was also noted by EMS upon arrival. Pt denied any drooling, headache, significant extremity weakness (although wife reports an abnormal gait, but denies any worsening), denies any numbness, dizziness, chest pain, SOB, abdominal pain, N/V/D/C, fever/chills, syncope. Of note, pt has a hx of CVA in 2012 which occurred shortly after PCI. Pt denied any other episode since 2012 : CT head on 10/04/17 indicated 1. No acute hemorrhage. 2. Questionable, partial loss of left insular ribbon. Aspects is 9 at worst. 3. Remote left midbrain infarct. Progressive cerebellar atrophy since 2012.  Assessment / Plan / Recommendation Clinical Impression   Pt appears to exhibit speech, language and cognition that is grossly within functional limits per performance on [facility] Cognitive Assessment) where he achieved a score that was within normal range (26+/30 considered normal); hx of previous CVA with residual right facial weakness and mild deficits with mobility (requiring walker) and cognition, but pt functional with deficits and was able to indicate all deficits and problem solve with SLP how to manage deficits within his home environment.  Speech intelligible, but minimal whole-word dysfluency noted when pt would increase rate of speech; denied any premorbid fluency issues, but communication is effective despite the minor dysfluencies noted.  Speech was slurred initially upon admission, but this has resolved per pt/wife.  No difficulties noted with swallowing per pt.  Pt also indicated initial aphasia present, but naming tasks were all [facility] during assessment, so this has resolved as well.  Pt writes primarily with left hand d/t previous CVA and difficulty writing with  dominant R hand, but he stated he "is still working on improving my writing with my right hand."  Reading WFL; no ST indicated at this time as pt is [facility] for all tasks.  Pt reminded to slow down when speaking to reduce impact of minor  dysfluencies, but otherwise no needs identified; ST will s/o at this time.    SLP Assessment  SLP Recommendation/Assessment: Patient does not need any further Speech Language Pathology Services SLP Visit Diagnosis: Other (comment) (no needs identified)    Follow Up Recommendations  None    Frequency and Duration   n/a        SLP Evaluation Cognition  Overall Cognitive Status: Within Functional Limits for tasks assessed Arousal/Alertness: Awake/alert Orientation Level: Oriented X4 Memory: Appears intact Awareness: Appears intact Problem Solving: Appears intact       Comprehension  Auditory Comprehension Overall Auditory Comprehension: Appears within functional limits for tasks assessed Yes/No Questions: Within Functional Limits Commands: Within Functional Limits Conversation: Complex Visual Recognition/Discrimination Discrimination: Within Function Limits Reading Comprehension Reading Status: Within funtional limits (for simple environmental signs/menu)    Expression Expression Primary Mode of Expression: Verbal Verbal Expression Overall Verbal Expression: Appears within functional limits for tasks assessed Initiation: No impairment Level of Generative/Spontaneous Verbalization: Conversation Repetition: No impairment Naming: No impairment Pragmatics: No impairment Non-Verbal Means of Communication: Not applicable Written Expression Dominant Hand: Right Written Expression: Not tested   Oral / Motor  Oral Motor/Sensory Function Overall Oral Motor/Sensory Function: Mild impairment Facial ROM: Reduced right Facial Symmetry: Abnormal symmetry right;Other (Comment) (residual from prior CVA in 2012) Facial Strength: Within Functional Limits Facial Sensation: Within Functional Limits Lingual ROM: Within Functional Limits Lingual Symmetry: Abnormal symmetry right;Other (Comment) (minimal) Lingual Strength: Within Functional Limits Lingual Sensation: Within Functional  Limits Motor Speech Overall Motor Speech: Appears within functional limits for tasks assessed Respiration: Within functional limits Phonation: Normal Resonance: Within functional limits Articulation: Within functional limitis Intelligibility: Intelligible Motor Planning: Witnin functional limits Motor Speech Errors: Not applicable             Functional Assessment Tool Used: NOMS; clinical judgment Functional Limitations: Other Speech Language Pathology Other Speech-Language Pathology Functional Limitation Current Status [phone]): At least 1 percent but less than 20 percent impaired, limited or restricted Other Speech-Language Pathology Functional Limitation Goal Status [phone]): At least 1 percent but less than 20 percent impaired, limited or restricted Other Speech-Language Pathology Functional Limitation Discharge Status [phone]): At least 1 percent but less than 20 percent impaired, limited or restricted         Tressie Stalker, M.S., CCC-SLP 10/05/2017, 11:09 AM

## 2017-10-05 NOTE — Progress Notes (Signed)
PT Cancellation Note  Patient Details Name: [name] MRN: [id number] DOB: 05/13/1946   Cancelled Treatment:    Reason Eval/Treat Not Completed: Patient at procedure or test/unavailable. Pt off of the floor to the vascular lab. PT will continue to f/u with pt as available.   Alessandra Bevels Jaymon Dudek 10/05/2017, 10:08 AM

## 2017-10-05 NOTE — Evaluation (Signed)
Physical Therapy Evaluation Patient Details Name: [name] MRN: [id number] DOB: 1946/05/18 Today's Date: 10/05/2017   History of Present Illness  Pt is a 71 y.o. male with medical history significant for CAD, HTN, HLD, DM, PVD, previous CVA in 2012 presents to the ED with slurred speech and R facial droop. CT of head on 10/04/17 indicated Remote left midbrain infarct. Progressive cerebellar atrophy since 2012.  Clinical Impression  Pt presented supine in bed with HOB elevated, awake and willing to participate in therapy session. Prior to admission, pt reported that he uses a w/c primarily for mobility but will ambulate short distances with a walker. Pt stated that he is independent with ADLs. Pt currently requires supervision for transfers and min guard for short distance ambulation with RW. Pt does demonstrate some gait abnormalities and residual weakness of R LE from prior CVA. Pt would continue to benefit from skilled physical therapy services at this time while admitted and after d/c to address the below listed limitations in order to improve overall safety and independence with functional mobility.     Follow Up Recommendations Outpatient PT;Other (comment) (NEURO OP PT)    Equipment Recommendations  Rolling walker with 5" wheels;Other (comment) (pt requesting a gait belt)    Recommendations for Other Services       Precautions / Restrictions Precautions Precautions: Fall Restrictions Weight Bearing Restrictions: No      Mobility  Bed Mobility Overal bed mobility: Modified Independent             General bed mobility comments: Pt sitting EOB when OT entered the room  Transfers Overall transfer level: Needs assistance Equipment used: Rolling walker (2 wheeled) Transfers: Sit to/from Stand Sit to Stand: Supervision         General transfer comment: supervision for safety  Ambulation/Gait Ambulation/Gait assistance: Min guard Ambulation Distance (Feet):  30 Feet Assistive device: Rolling walker (2 wheeled) Gait Pattern/deviations: Step-through pattern;Decreased step length - right;Decreased step length - left;Decreased stride length;Decreased dorsiflexion - right Gait velocity: decreased Gait velocity interpretation: Below normal speed for age/gender General Gait Details: modest instability but no overt LOB or need for physical assistance, min guard for safety  Stairs            Wheelchair Mobility    Modified Rankin (Stroke Patients Only)       Balance Overall balance assessment: Needs assistance Sitting-balance support: No upper extremity supported;Feet supported Sitting balance-Leahy Scale: Normal Sitting balance - Comments: sitting EOB   Standing balance support: Bilateral upper extremity supported;During functional activity Standing balance-Leahy Scale: Poor Standing balance comment: reliant on bilateral UEs on RW                             Pertinent Vitals/Pain Pain Assessment: No/denies pain    Home Living Family/patient expects to be discharged to:: Private residence Living Arrangements: Spouse/significant other;Children Available Help at Discharge: Family;Friend(s);Available 24 hours/day Type of Home: House       Home Layout: Two level;Able to live on main level with bedroom/bathroom Home Equipment: Walker - 4 wheels;Walker - 2 wheels;Cane - quad;Shower seat - built in;Grab bars - toilet;Grab bars - tub/shower;Hand held shower head;Wheelchair - manual Additional Comments: Pt goes to aquatic therapy    Prior Function Level of Independence: Independent with assistive device(s)         Comments: uses a RW for short community ambulation but uses a wc primarily at home, can bath and  dress self with increased time     Hand Dominance   Dominant Hand: Right (but has learned to be left hand dominant)    Extremity/Trunk Assessment   Upper Extremity Assessment Upper Extremity Assessment: Defer  to OT evaluation    Lower Extremity Assessment Lower Extremity Assessment: Generalized weakness;RLE deficits/detail RLE Deficits / Details: residual weakness from prior CVA; pt with drop foot and reported that he has an AFO at home RLE Coordination: decreased gross motor;decreased fine motor       Communication   Communication: Expressive difficulties  Cognition Arousal/Alertness: Awake/alert Behavior During Therapy: WFL for tasks assessed/performed Overall Cognitive Status: Within Functional Limits for tasks assessed                                 General Comments: cognition not formally assessed but [facility] for general conversation      General Comments General comments (skin integrity, edema, etc.): Pt normally has brace he wears on LLE for foot drop; Pt's wife and daughters present during session    Exercises     Assessment/Plan    PT Assessment Patient needs continued PT services  PT Problem List Decreased strength;Decreased balance;Decreased mobility;Decreased coordination;Decreased safety awareness;Decreased knowledge of precautions       PT Treatment Interventions DME instruction;Gait training;Stair training;Functional mobility training;Therapeutic activities;Therapeutic exercise;Balance training;Neuromuscular re-education;Patient/family education    PT Goals (Current goals can be found in the Care Plan section)  Acute Rehab PT Goals Patient Stated Goal: "I want to drive again" PT Goal Formulation: With patient Time For Goal Achievement: 10/19/17 Potential to Achieve Goals: Good    Frequency Min 3X/week   Barriers to discharge        Co-evaluation               AM-PAC PT "6 Clicks" Daily Activity  Outcome Measure Difficulty turning over in bed (including adjusting bedclothes, sheets and blankets)?: None Difficulty moving from lying on back to sitting on the side of the bed? : None Difficulty sitting down on and standing up from a chair  with arms (e.g., wheelchair, bedside commode, etc,.)?: None Help needed moving to and from a bed to chair (including a wheelchair)?: A Little Help needed walking in hospital room?: A Little Help needed climbing 3-5 steps with a railing? : A Little 6 Click Score: 21    End of Session   Activity Tolerance: Patient tolerated treatment well Patient left: in bed;with call bell/phone within reach;with family/visitor present Nurse Communication: Mobility status PT Visit Diagnosis: Other abnormalities of gait and mobility (R26.89)    Time: 6578-4696 PT Time Calculation (min) (ACUTE ONLY): 17 min   Charges:   PT Evaluation $PT Eval Moderate Complexity: 1 Mod     PT G Codes:   PT G-Codes **NOT FOR INPATIENT CLASS** Functional Assessment Tool Used: AM-PAC 6 Clicks Basic Mobility;Clinical judgement Functional Limitation: Mobility: Walking and moving around Mobility: Walking and Moving Around Current Status (E9528): At least 20 percent but less than 40 percent impaired, limited or restricted Mobility: Walking and Moving Around Goal Status [phone]): 0 percent impaired, limited or restricted    [facility], PT, DPT [phone]   Alessandra Bevels Eloni Darius 10/05/2017, 5:19 PM

## 2017-10-05 NOTE — Progress Notes (Signed)
[facility]                                                                              Patient Demographics  [name], is a 71 y.o. male, DOB - [birth year], UJW:[id number]  Admit date - 10/04/2017   Admitting Physician Briant Cedar, MD  Outpatient Primary MD for the patient is Georgann Housekeeper, MD  Outpatient specialists:   LOS - 0  days   Medical records reviewed and are as summarized below:    Chief Complaint  Patient presents with  . Code Stroke       Brief summary   Per admit note by Dr Eda Paschal [name] is a 71 y.o. male with medical history significant for CAD, HTN, HLD, DM, PVD, previous CVA in 2012 presented to the ED this am c/o slurred speech + R facial droop. Around 7:40am after daughter noted pt had slurred shortly after breakfast. A mild right facial droop was also noted by EMS upon arrival. Pt denied any drooling, headache, significant extremity weakness (although wife reports an abnormal gait, but denies any worsening), denied any numbness, dizziness, chest pain, SOB, abdominal pain, N/V/D/C, fever/chills, syncope. Of note, pt has a hx of CVA in 2012 which occurred shortly after PCI. Pt denied any other episode since 2012.Patient did not receive TPA due to improving symptoms.   Assessment & Plan    Principal Problem:   TIA presenting with slurred speech - Symptoms now improved. - CT head code stroke showed no acute hemorrhage, question of a partial loss of the left insular ribbon, remote left midbrain infarct - Patient as refused MRI/MRA. Discussed with neurology, Dr Nicholas Lose and Dr Roda Shutters, recommended CT angiogram of the head and neck - CT angiogram head and neck showed no acute findings,remote left midbrain and cerebellar infarcts. Bilateral cervical carotid bifurcation atherosclerosis without flow-limiting stenosis or ulceration. Moderate narrowing at the origin of left vertebral artery, right vertebral artery V1 and V2  junction in C1. 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 (HCC) - continue glipizide outpatient, currently on sliding scale insulin - hemoglobin A1c 6.3    CAD (coronary artery disease) of artery bypass graft, Peripheral vascular disease (HCC) - currently no acute chest pain or shortness of breath, continue aspirin, Plavix, statin    HLD (hyperlipidemia) - LDL 88, continue Lipitor 40 mg daily  Code Status: full  DVT Prophylaxis:  Lovenox Family Communication: Discussed in detail with the patient, all imaging results, lab results explained to the patient and wife    Disposition Plan: dc when w/u completed Time Spent in minutes  25 minutes  Procedures:  MRI, MRA  Consultants:   neurology  Antimicrobials:      Medications  Scheduled Meds: .  stroke: mapping our early stages of recovery book   Does not apply Once  . allopurinol  100 mg Oral Daily  . aspirin  300 mg Rectal Daily   Or  . aspirin  325 mg Oral Daily  . atorvastatin  40 mg Oral Daily  . clopidogrel  75 mg Oral Daily  . enoxaparin (LOVENOX) injection  40 mg Subcutaneous Q24H  . insulin aspart  0-5 Units Subcutaneous QHS  . insulin aspart  0-9 Units Subcutaneous TID WC   Continuous Infusions: . sodium chloride     PRN Meds:.acetaminophen **OR** acetaminophen (TYLENOL) oral liquid 160 mg/5 mL **OR**  acetaminophen, senna-docusate   Antibiotics   Anti-infectives    None        Subjective:   [name] was seen and examined today.  Speech improving. Patient denies dizziness, chest pain, shortness of breath, abdominal pain, N/V/D/C, new weakness, numbess, tingling. No acute events overnight.    Objective:   Vitals:   10/05/17 0000 10/05/17 0142 10/05/17 0400 10/05/17 0600  BP: (!) 144/94 (!) 145/70 (!) 150/82 (!) 158/89  Pulse: 71 73 67 77  Resp: Temp: 98.4 F (36.9 C) 97.7 F (36.5 C) 97.8 F (36.6 C) (!) 97.4 F (36.3 C)  TempSrc: Oral Oral Oral Oral  SpO2: 95% 95% 95% 93%  Weight: 119.1 kg (262 lb 9.1 oz)     Height:  (1.854 m)      No intake or output data in the 24 hours ending 10/05/17 1058   Wt Readings from Last 3 Encounters:  10/05/17 119.1 kg (262 lb 9.1 oz)  11/05/16 119.7 kg (264 lb)  10/31/15 115.7 kg (255 lb)     Exam  General: Alert and oriented x 3, NAD, mild speech deficit noted  Eyes: PERRLA, EOMI  HEENT:  Atraumatic, normocephalic  Cardiovascular: S1 S2 auscultated, no rubs, murmurs or gallops. Regular rate and rhythm.  Respiratory: Clear to auscultation bilaterally, no wheezing, rales or rhonchi  Gastrointestinal: Soft, nontender, nondistended, + bowel sounds  Ext: no pedal edema bilaterally  Neuro: AAOx3, Cr N's II- XII. Strength 5/5 upper and lower extremities bilaterally,   Musculoskeletal: No digital cyanosis, clubbing  Skin: No rashes  Psych: Normal affect and demeanor, alert and oriented x3    Data Reviewed:  I have personally reviewed following labs and imaging studies  Micro Results No results found for this or any previous visit (from the past 240 hour(s)).  Radiology Reports Ct Angio Head W Or Wo Contrast  Result Date: 10/05/2017 CLINICAL DATA:  Follow-up stroke EXAM: CT ANGIOGRAPHY HEAD AND NECK TECHNIQUE: Multidetector CT imaging of the head and neck was performed using the standard  protocol during bolus administration of intravenous contrast. Multiplanar CT image reconstructions and MIPs were obtained to evaluate the vascular anatomy. Carotid stenosis measurements (when applicable) are obtained utilizing NASCET criteria, using the distal internal carotid diameter as the denominator. CONTRAST:  50 cc Isovue 370 intravenous COMPARISON:  Head CT from yesterday FINDINGS: CT HEAD FINDINGS Brain: Insular ribbon loss on the left that was questioned yesterday is not seen today. No definite infarct. No acute hemorrhage. Remote left midbrain infarct. Vascular: See below Skull: No acute or aggressive finding Sinuses: Negative Orbits: Right cataract resection.  No acute finding Review of the MIP images confirms the above findings CTA NECK  FINDINGS Aortic arch: Atherosclerosis. Three vessel branching. Partially seen changes of CABG with patency of the visible saphenous vein graft. Right carotid system: Mild predominately calcified plaque at the ICA bulb without stenosis. No ulceration or dissection. Tortuosity with ICA looping. Left carotid system: Bulky plaque at the ICA bulb with approximately 20% stenosis. No ulceration. Negative for dissection. Plaque without stenosis at the distal ICA. Vertebral arteries: Proximal subclavian atherosclerosis without flow limiting stenosis. Moderate narrowing of the left vertebral artery origin due to atheromatous plaque. There are additional atheromatous calcifications in the left V1 segment. Moderate narrowing of the right vertebral artery within the C1 transverse foramen, best seen on coronal reformats. Given the limited extent and absent neck pain history this is likely atheromatous disease. Moderate narrowing of the right vertebral artery as it enters the transverse foramen. Skeleton: No acute or aggressive finding. Other neck: No incidental mass or inflammation. Upper chest: No acute finding.  Mild centrilobular emphysema. Review of the MIP images confirms the  above findings CTA HEAD FINDINGS Anterior circulation: Heavy calcified atheromatous plaque at both siphons with high-grade bilateral mid cavernous narrowing. Quantification is limited due to the degree of calcified plaque blooming. Hypoplastic left A1 segment, there is an azygos type A2 segment. Atherosclerotic irregularity of bilateral MCA branches without branch occlusion. Negative for aneurysm. Posterior circulation: Fairly symmetric vertebral arteries. Bilateral V4 atherosclerosis with moderate left and advanced right stenosis at the vertebrobasilar junction. High-grade proximal basilar stenosis with extrinsic constriction. This is where filling defect was seen on catheter angiography in 2012. Severe left P1 segment stenosis with nonvisualized vessel. Bilateral atheromatous irregularity of PCA branches. Venous sinuses: Patent on the delayed phase. Anatomic variants: None significant. Delayed phase: No abnormal intracranial enhancement. Review of the MIP images confirms the above findings IMPRESSION: Head CT : 1. Questioned left insular infarct on prior head CT is not re- demonstrated. No acute finding. 2. Remote left midbrain and cerebellar infarcts. CTA neck: 1. Bilateral cervical carotid bifurcation atherosclerosis without flow limiting stenosis or ulceration. 2. Moderate narrowing at the origin of the left vertebral artery. 3. Moderate narrowing of the right vertebral artery of V1/2 junction and C1. Intracranial CTA: 1. High-grade bilateral cavernous carotid stenosis. Quantification is limited due to degree of calcified plaque which obscures the lumen. 2. Severe stenosis at the right vertebrobasilar junction. Moderate narrowing at the left vertebrobasilar junction. 3. High-grade proximal basilar stenosis. A luminal filling defect was seen at this level on 2012 catheter angiography. 4. Severe left P1 segment stenosis with no visible flow over a short segment. Electronically Signed   By: Marnee Spring M.D.    On: 10/05/2017 10:43   Dg Chest 2 View  Result Date: 10/04/2017 CLINICAL DATA:  Possible stroke.  Slurred speech. EXAM: CHEST  2 VIEW COMPARISON:  Chest radiograph 09/11/2011 FINDINGS: There is cardiomegaly within obscured left retrocardiac region. No pneumothorax or sizable pleural effusion. No focal airspace consolidation or pulmonary edema. IMPRESSION: Cardiomegaly, unchanged, without focal consolidation or overt pulmonary edema. Electronically Signed   By: Deatra Robinson M.D.   On: 10/04/2017 22:27   Ct Angio Neck W Or Wo Contrast  Result Date: 10/05/2017 CLINICAL DATA:  Follow-up stroke EXAM: CT ANGIOGRAPHY HEAD AND NECK TECHNIQUE: Multidetector CT imaging of the head and neck was performed using the standard protocol during bolus administration of intravenous contrast. Multiplanar CT image reconstructions and MIPs were obtained to evaluate the vascular anatomy. Carotid stenosis measurements (when applicable) are obtained utilizing NASCET criteria, using the distal internal carotid diameter  as the denominator. CONTRAST:  50 cc Isovue 370 intravenous COMPARISON:  Head CT from yesterday FINDINGS: CT HEAD FINDINGS Brain: Insular ribbon loss on the left that was questioned yesterday is not seen today. No definite infarct. No acute hemorrhage. Remote left midbrain infarct. Vascular: See below Skull: No acute or aggressive finding Sinuses: Negative Orbits: Right cataract resection.  No acute finding Review of the MIP images confirms the above findings CTA NECK FINDINGS Aortic arch: Atherosclerosis. Three vessel branching. Partially seen changes of CABG with patency of the visible saphenous vein graft. Right carotid system: Mild predominately calcified plaque at the ICA bulb without stenosis. No ulceration or dissection. Tortuosity with ICA looping. Left carotid system: Bulky plaque at the ICA bulb with approximately 20% stenosis. No ulceration. Negative for dissection. Plaque without stenosis at the distal  ICA. Vertebral arteries: Proximal subclavian atherosclerosis without flow limiting stenosis. Moderate narrowing of the left vertebral artery origin due to atheromatous plaque. There are additional atheromatous calcifications in the left V1 segment. Moderate narrowing of the right vertebral artery within the C1 transverse foramen, best seen on coronal reformats. Given the limited extent and absent neck pain history this is likely atheromatous disease. Moderate narrowing of the right vertebral artery as it enters the transverse foramen. Skeleton: No acute or aggressive finding. Other neck: No incidental mass or inflammation. Upper chest: No acute finding.  Mild centrilobular emphysema. Review of the MIP images confirms the above findings CTA HEAD FINDINGS Anterior circulation: Heavy calcified atheromatous plaque at both siphons with high-grade bilateral mid cavernous narrowing. Quantification is limited due to the degree of calcified plaque blooming. Hypoplastic left A1 segment, there is an azygos type A2 segment. Atherosclerotic irregularity of bilateral MCA branches without branch occlusion. Negative for aneurysm. Posterior circulation: Fairly symmetric vertebral arteries. Bilateral V4 atherosclerosis with moderate left and advanced right stenosis at the vertebrobasilar junction. High-grade proximal basilar stenosis with extrinsic constriction. This is where filling defect was seen on catheter angiography in 2012. Severe left P1 segment stenosis with nonvisualized vessel. Bilateral atheromatous irregularity of PCA branches. Venous sinuses: Patent on the delayed phase. Anatomic variants: None significant. Delayed phase: No abnormal intracranial enhancement. Review of the MIP images confirms the above findings IMPRESSION: Head CT : 1. Questioned left insular infarct on prior head CT is not re- demonstrated. No acute finding. 2. Remote left midbrain and cerebellar infarcts. CTA neck: 1. Bilateral cervical carotid  bifurcation atherosclerosis without flow limiting stenosis or ulceration. 2. Moderate narrowing at the origin of the left vertebral artery. 3. Moderate narrowing of the right vertebral artery of V1/2 junction and C1. Intracranial CTA: 1. High-grade bilateral cavernous carotid stenosis. Quantification is limited due to degree of calcified plaque which obscures the lumen. 2. Severe stenosis at the right vertebrobasilar junction. Moderate narrowing at the left vertebrobasilar junction. 3. High-grade proximal basilar stenosis. A luminal filling defect was seen at this level on 2012 catheter angiography. 4. Severe left P1 segment stenosis with no visible flow over a short segment. Electronically Signed   By: Marnee Spring M.D.   On: 10/05/2017 10:43   Ct Head Code Stroke Wo Contrast  Result Date: 10/04/2017 CLINICAL DATA:  Code stroke.  Aphasia EXAM: CT HEAD WITHOUT CONTRAST TECHNIQUE: Contiguous axial images were obtained from the base of the skull through the vertex without intravenous contrast. COMPARISON:  Head CT 01/29/2011 FINDINGS: Brain: Questionable loss of insular ribbon anteriorly and inferiorly. No acute hemorrhage, hydrocephalus, or masslike finding. Remote infarct of the left midbrain and upper pons.  Small remote left cerebellar infarct. The cerebellum has become progressively atrophic since 2012. Vascular: Atherosclerotic calcification. No definite hyperdense vessel. Skull: No acute or aggressive finding Sinuses/Orbits: No pathologic finding.  Right cataract resection. Other: These results were called by telephone at the time of interpretation on 10/04/2017 at 8:58 am to Dr. Lynden Oxford , who verbally acknowledged these results. ASPECTS [facility] Stroke Program Early CT Score) -left hemisphere - Ganglionic level infarction (caudate, lentiform nuclei, internal capsule, insula, M1-M3 cortex): 6 - Supraganglionic infarction (M4-M6 cortex): 3 Total score (0-10 with 10 being normal): 9 IMPRESSION:  1. No acute hemorrhage. 2. Questionable, partial loss of left insular ribbon. Aspects is 9 at worst. 3. Remote left midbrain infarct. Progressive cerebellar atrophy since 2012. Electronically Signed   By: Marnee Spring M.D.   On: 10/04/2017 09:00    Lab Data:  CBC:  Recent Labs Lab 10/04/17 0845 10/04/17 0849  WBC 6.6  --   NEUTROABS 3.9  --   HGB 15.5 17.0  HCT 46.3 50.0  MCV 92.4  --   PLT 202  --    Basic Metabolic Panel:  Recent Labs Lab 10/04/17 0845 10/04/17 0849 10/05/17 0523  NA 138 141 138  K 3.9 3.9 4.2  CL 104 103 104  CO2 26  --  27  GLUCOSE 168* 170* 95  BUN CREATININE 1.22 1.10 1.04  CALCIUM 8.6*  --  8.8*   GFR: Estimated Creatinine Clearance: 86.8 mL/min (by C-G formula based on SCr of 1.04 mg/dL). Liver Function Tests:  Recent Labs Lab 10/04/17 0845  AST 27  ALT 25  ALKPHOS 77  BILITOT 1.0  PROT 6.7  ALBUMIN 3.4*   No results for input(s): LIPASE, AMYLASE in the last 168 hours. No results for input(s): AMMONIA in the last 168 hours. Coagulation Profile:  Recent Labs Lab 10/04/17 0845  INR 1.12   Cardiac Enzymes: No results for input(s): CKTOTAL, CKMB, CKMBINDEX, TROPONINI in the last 168 hours. BNP (last 3 results) No results for input(s): PROBNP in the last 8760 hours. HbA1C:  Recent Labs  10/05/17 0523  HGBA1C 6.3*   CBG:  Recent Labs Lab 10/04/17 0843 10/04/17 2145 10/05/17 0644  GLUCAP 162* 87 98   Lipid Profile:  Recent Labs  10/05/17 0523  CHOL 135  HDL 30*  LDLCALC 88  TRIG 87  CHOLHDL 4.5   Thyroid Function Tests:  Recent Labs  10/05/17 0523  TSH 1.439   Anemia Panel: No results for input(s): VITAMINB12, FOLATE, FERRITIN, TIBC, IRON, RETICCTPCT in the last 72 hours. Urine analysis:    Component Value Date/Time   COLORURINE YELLOW 02/07/2011 1823   APPEARANCEUR CLEAR 02/07/2011 1823   LABSPEC 1.019 02/07/2011 1823   PHURINE 7.5 02/07/2011 1823   GLUCOSEU NEGATIVE 09/25/2010 0742     HGBUR NEGATIVE 02/07/2011 1823   BILIRUBINUR NEGATIVE 02/07/2011 1823   KETONESUR NEGATIVE 02/07/2011 1823   PROTEINUR NEGATIVE 02/07/2011 1823   UROBILINOGEN 1.0 02/07/2011 1823   NITRITE NEGATIVE 02/07/2011 1823   LEUKOCYTESUR  02/07/2011 1823    NEGATIVE MICROSCOPIC NOT DONE ON URINES WITH NEGATIVE PROTEIN, BLOOD, LEUKOCYTES, NITRITE, OR GLUCOSE <1000 mg/dL.     Thad Ranger M.D. [facility] 10/05/2017, 10:58 AM  Pager: [phone] Between 7am to 7pm - call Pager - [phone]  After 7pm go to [email or web address] - password [email or web address]  Call night coverage person covering after 7pm

## 2017-10-06 DIAGNOSIS — I639 Cerebral infarction, unspecified: Secondary | ICD-10-CM | POA: Diagnosis not present

## 2017-10-06 DIAGNOSIS — I69359 Hemiplegia and hemiparesis following cerebral infarction affecting unspecified side: Secondary | ICD-10-CM | POA: Diagnosis not present

## 2017-10-06 DIAGNOSIS — G459 Transient cerebral ischemic attack, unspecified: Secondary | ICD-10-CM | POA: Diagnosis not present

## 2017-10-06 DIAGNOSIS — I10 Essential (primary) hypertension: Secondary | ICD-10-CM | POA: Diagnosis not present

## 2017-10-06 LAB — GLUCOSE, CAPILLARY
GLUCOSE-CAPILLARY: 145 mg/dL — AB (ref 65–99)
Glucose-Capillary: 116 mg/dL — ABNORMAL HIGH (ref 65–99)
Glucose-Capillary: 131 mg/dL — ABNORMAL HIGH (ref 65–99)

## 2017-10-06 LAB — VAS US CAROTID
LEFT ECA DIAS: -2 cm/s
LEFT VERTEBRAL DIAS: -14 cm/s
Left CCA dist dias: -14 cm/s
Left CCA dist sys: -132 cm/s
Left CCA prox dias: 16 cm/s
Left CCA prox sys: 104 cm/s
Left ICA dist dias: -19 cm/s
Left ICA dist sys: -72 cm/s
Left ICA prox dias: -20 cm/s
Left ICA prox sys: -99 cm/s
RCCADSYS: 66 cm/s
RCCAPDIAS: -24 cm/s
RIGHT ECA DIAS: -19 cm/s
RIGHT VERTEBRAL DIAS: -7 cm/s
Right CCA prox sys: -81 cm/s

## 2017-10-06 MED ORDER — CLOPIDOGREL BISULFATE 75 MG PO TABS: 75.0000 mg | ORAL_TABLET | Freq: Every day | ORAL | 2 refills | Status: DC

## 2017-10-06 MED ORDER — UNABLE TO FIND: 0 refills | Status: DC

## 2017-10-06 NOTE — Care Management Note (Signed)
Case Management Note  Patient Details  Name: [name] MRN: [id number] Date of Birth: 01/12/1946  Subjective/Objective:  Called pt and discussed need for pt to call [facility] to schedule follow up and out pt PT/OT. Pt will call and schedule early next week.                   Action/Plan:CM will sign off for now but will be available should additional discharge needs arise or disposition change.    Expected Discharge Date:  10/06/17               Expected Discharge Plan:     In-House Referral:     Discharge planning Services  CM Consult  Post Acute Care Choice:    Choice offered to:  Patient  DME Arranged:  N/A DME Agency:     HH Arranged:  PT, OT (Outpt PT/OT) HH Agency:     Status of Service:  Completed, signed off  If discussed at Long Length of Stay Meetings, dates discussed:    Additional Comments:  Yvone Neu, RN 10/06/2017, 9:33 AM

## 2017-10-06 NOTE — Discharge Summary (Signed)
Physician Discharge Summary   Patient ID: [name] MRN: [id number] DOB/AGE: 72/04/1946 72 y.o.  Admit date: 10/04/2017 Discharge date: 10/06/2017  Primary Care Physician:  Georgann Housekeeper, MD  Discharge Diagnoses:    . TIA presented with slurred speech . CAD (coronary artery disease) of artery bypass graft . HTN (hypertension) . 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 Peripheral vascular disease, unspecified (HCC) . HLD (hyperlipidemia)   Consults:  neurology  Recommendations for Outpatient Follow-up:  1. Per neurology recommendations, added Plavix 75 mg daily 2. Please repeat CBC/BMET at next visit 3. PT ventilation recommended outpatient PT   DIET: heart healthy diet    Allergies:  No Known Allergies   DISCHARGE MEDICATIONS: Current Discharge Medication List    START taking these medications   Details  clopidogrel (PLAVIX) 75 MG tablet Take 1 tablet (75 mg total) by mouth daily. Qty: 90 tablet, Refills: 2    UNABLE TO FIND Outpatient physical therapy  Diagnosis: TIA Qty: 1 Mutually Defined, Refills: 0      CONTINUE these medications which have NOT CHANGED   Details  allopurinol (ZYLOPRIM) 100 MG tablet Take 100 mg by mouth daily.    aspirin 325 MG tablet Take 325 mg by mouth daily.    atenolol (TENORMIN) 50 MG tablet Take 0.5 tablets (25 mg total) by mouth 2 (two) times daily. Qty: 30 tablet, Refills: 10    atorvastatin (LIPITOR) 40 MG tablet Take 1 tablet (40 mg total) by mouth daily. Qty: 90 tablet, Refills: 3   Associated Diagnoses: CVA, old, hemiparesis (HCC)    glipiZIDE (GLUCOTROL) 5 MG tablet Take 2.5 mg by mouth 2 (two) times daily before a meal.     lisinopril (PRINIVIL,ZESTRIL) 2.5 MG tablet Take 2.5 mg by mouth daily.     nitroGLYCERIN (NITROSTAT) 0.4 MG SL tablet Place 1 tablet (0.4 mg total) under the tongue every 5 (five) minutes as needed for chest pain. Qty: 25 tablet, Refills: 3    Associated Diagnoses: Coronary artery disease involving coronary bypass graft of native heart with unspecified angina pectoris    tiZANidine (ZANAFLEX) 2 MG tablet TAKE 1 TABLET TWICE A DAY BEFORE A MEAL Qty: 60 tablet, Refills: 5         Brief H and P: For complete details please refer to admission H and P, but in brief  Craig Brancato Bigelowis a 71 y.o.malewith medical history significant for CAD, HTN, HLD, DM, PVD, previous CVA in 2012 presented to the ED this am c/o slurred speech + R facial droop. Around 7:40am after daughter noted pt had slurred shortly after breakfast. A mild right facial droop was also noted by EMS upon arrival. Pt denied any drooling, headache, significant extremity weakness (although wife reports an abnormal gait, but denies any worsening), denied any numbness, dizziness, chest pain, SOB, abdominal pain, N/V/D/C, fever/chills, syncope. Of note, pt has a hx of CVA in 2012 which occurred shortly after PCI. Pt denied any other episode since 2012.Patient did not receive TPA due to improving symptoms  Hospital Course:   TIA presenting with slurred speech - Symptoms now improved. - CT head code stroke showed no acute hemorrhage, question of a partial loss of the left insular ribbon, remote left midbrain infarct - Patient refused MRI/MRA.  - CT angiogram head and neck showed no acute findings,remote left midbrain and cerebellar infarcts. Bilateral cervical carotid bifurcation atherosclerosis without flow-limiting stenosis or ulceration. Moderate narrowing at the origin of left vertebral artery, right  vertebral artery V1 and V2 junction in C1. - intracranial CTA showed high-grade bilateral cavernous carotid stenosis, severe stenosis of the right vertebrobasilar junction, moderate narrowing at the left vertebrobasilar junction, high-grade proximal basilar stenosis - carotid Doppler showed 1-39% ICA plaquing - Currently on aspirin 325 mg daily ( on aspirin 325 mg PTA),  neurology added Plavix - LDL 88, on Lipitor 40 mg daily - 2-D echo showed normal systolic function, grade 1 diastolic dysfunction - PTOT evaluation recommended outpatient PT, case management gave the information to the patient    HTN (hypertension) -  restart atenolol, lisinopril at discharge     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 HEENT: anicteric sclera, pupils reactive to light and accommodation CVS: S1-S2 clear no murmur rubs or gallops Chest: clear to auscultation bilaterally, no wheezing rales or rhonchi Abdomen: soft nontender, nondistended, normal bowel sounds Extremities: no cyanosis, clubbing or edema noted bilaterally Neuro: Cranial nerves II-XII intact, no focal neurological deficits   The results of significant diagnostics from this hospitalization (including imaging, microbiology, ancillary and laboratory) are listed below for reference.    LAB RESULTS: Basic Metabolic Panel:  Recent Labs Lab 10/04/17 0845 10/04/17 0849 10/05/17 0523  NA 138 141 138  K 3.9 3.9 4.2  CL 104 103 104  CO2 26  --  27  GLUCOSE 168* 170* 95  BUN CREATININE 1.22 1.10 1.04  CALCIUM 8.6*  --  8.8*   Liver Function Tests:  Recent Labs Lab 10/04/17 0845  AST 27  ALT 25  ALKPHOS 77  BILITOT 1.0  PROT 6.7  ALBUMIN 3.4*    No results for input(s): LIPASE, AMYLASE in the last 168 hours. No results for input(s): AMMONIA in the last 168 hours. CBC:  Recent Labs Lab 10/04/17 0845 10/04/17 0849  WBC 6.6  --   NEUTROABS 3.9  --   HGB 15.5 17.0  HCT 46.3 50.0  MCV 92.4  --   PLT 202  --    Cardiac Enzymes: No results for input(s): CKTOTAL, CKMB, CKMBINDEX, TROPONINI in the last 168 hours. BNP: Invalid input(s): POCBNP CBG:  Recent Labs Lab 10/06/17 0458 10/06/17 1140  GLUCAP 116* 131*    Significant Diagnostic Studies:  Ct Angio Head W Or Wo Contrast  Result Date: 10/05/2017 CLINICAL DATA:  Follow-up stroke EXAM: CT ANGIOGRAPHY HEAD AND NECK TECHNIQUE: Multidetector CT imaging of the head and neck was performed using the standard protocol during bolus administration of intravenous contrast. Multiplanar CT image reconstructions and MIPs were obtained to evaluate the vascular anatomy. Carotid stenosis measurements (when applicable) are obtained utilizing NASCET criteria, using the distal internal carotid diameter as the denominator. CONTRAST:  50 cc Isovue 370 intravenous COMPARISON:  Head CT from yesterday FINDINGS: CT  HEAD FINDINGS Brain: Insular ribbon loss on the left that was questioned yesterday is not seen today. No definite infarct. No acute hemorrhage. Remote left midbrain infarct. Vascular: See below Skull: No acute or aggressive finding Sinuses: Negative Orbits: Right cataract resection.  No acute finding Review of the MIP images confirms the above findings CTA NECK FINDINGS Aortic arch: Atherosclerosis. Three vessel branching. Partially seen changes of CABG with patency of the visible saphenous vein graft. Right carotid system: Mild predominately calcified plaque at the ICA bulb without stenosis. No ulceration or dissection. Tortuosity with ICA looping. Left carotid system: Bulky plaque at the ICA bulb with approximately 20% stenosis. No ulceration. Negative for dissection. Plaque without  stenosis at the distal ICA. Vertebral arteries: Proximal subclavian atherosclerosis without flow limiting stenosis. Moderate narrowing of the left vertebral artery origin due to atheromatous plaque. There are additional atheromatous calcifications in the left V1 segment. Moderate narrowing of the right vertebral artery within the C1 transverse foramen, best seen on coronal reformats. Given the limited extent and absent neck pain history this is likely atheromatous disease. Moderate narrowing of the right vertebral artery as it enters the transverse foramen. Skeleton: No acute or aggressive finding. Other neck: No incidental mass or inflammation. Upper chest: No acute finding.  Mild centrilobular emphysema. Review of the MIP images confirms the above findings CTA HEAD FINDINGS Anterior circulation: Heavy calcified atheromatous plaque at both siphons with high-grade bilateral mid cavernous narrowing. Quantification is limited due to the degree of calcified plaque blooming. Hypoplastic left A1 segment, there is an azygos type A2 segment. Atherosclerotic irregularity of bilateral MCA branches without branch occlusion. Negative for aneurysm. Posterior circulation: Fairly symmetric vertebral arteries. Bilateral V4 atherosclerosis with moderate left and advanced right stenosis at the vertebrobasilar junction. High-grade proximal basilar stenosis with extrinsic constriction. This is where filling defect was seen on catheter angiography in 2012. Severe left P1 segment stenosis with nonvisualized vessel. Bilateral atheromatous irregularity of PCA branches. Venous sinuses: Patent on the delayed phase. Anatomic variants: None significant. Delayed phase: No abnormal intracranial enhancement. Review of the MIP images confirms the above findings IMPRESSION: Head CT : 1. Questioned left insular infarct on prior head CT is not re- demonstrated. No acute finding. 2. Remote left midbrain and cerebellar infarcts. CTA neck: 1. Bilateral  cervical carotid bifurcation atherosclerosis without flow limiting stenosis or ulceration. 2. Moderate narrowing at the origin of the left vertebral artery. 3. Moderate narrowing of the right vertebral artery of V1/2 junction and C1. Intracranial CTA: 1. High-grade bilateral cavernous carotid stenosis. Quantification is limited due to degree of calcified plaque which obscures the lumen. 2. Severe stenosis at the right vertebrobasilar junction. Moderate narrowing at the left vertebrobasilar junction. 3. High-grade proximal basilar stenosis. A luminal filling defect was seen at this level on 2012 catheter angiography. 4. Severe left P1 segment stenosis with no visible flow over a short segment. Electronically Signed   By: Marnee Spring M.D.   On: 10/05/2017 10:43   Dg Chest 2 View  Result Date: 10/04/2017 CLINICAL DATA:  Possible stroke.  Slurred speech. EXAM: CHEST  2 VIEW COMPARISON:  Chest radiograph 09/11/2011 FINDINGS: There is cardiomegaly within obscured left retrocardiac region. No pneumothorax or sizable pleural effusion. No focal airspace consolidation or pulmonary edema. IMPRESSION: Cardiomegaly, unchanged, without focal consolidation or overt pulmonary edema. Electronically Signed   By: Deatra Robinson M.D.   On: 10/04/2017 22:27   Ct Angio Neck W Or Wo Contrast  Result Date: 10/05/2017 CLINICAL DATA:  Follow-up  stroke EXAM: CT ANGIOGRAPHY HEAD AND NECK TECHNIQUE: Multidetector CT imaging of the head and neck was performed using the standard protocol during bolus administration of intravenous contrast. Multiplanar CT image reconstructions and MIPs were obtained to evaluate the vascular anatomy. Carotid stenosis measurements (when applicable) are obtained utilizing NASCET criteria, using the distal internal carotid diameter as the denominator. CONTRAST:  50 cc Isovue 370 intravenous COMPARISON:  Head CT from yesterday FINDINGS: CT HEAD FINDINGS Brain: Insular ribbon loss on the left that was  questioned yesterday is not seen today. No definite infarct. No acute hemorrhage. Remote left midbrain infarct. Vascular: See below Skull: No acute or aggressive finding Sinuses: Negative Orbits: Right cataract resection.  No acute finding Review of the MIP images confirms the above findings CTA NECK FINDINGS Aortic arch: Atherosclerosis. Three vessel branching. Partially seen changes of CABG with patency of the visible saphenous vein graft. Right carotid system: Mild predominately calcified plaque at the ICA bulb without stenosis. No ulceration or dissection. Tortuosity with ICA looping. Left carotid system: Bulky plaque at the ICA bulb with approximately 20% stenosis. No ulceration. Negative for dissection. Plaque without stenosis at the distal ICA. Vertebral arteries: Proximal subclavian atherosclerosis without flow limiting stenosis. Moderate narrowing of the left vertebral artery origin due to atheromatous plaque. There are additional atheromatous calcifications in the left V1 segment. Moderate narrowing of the right vertebral artery within the C1 transverse foramen, best seen on coronal reformats. Given the limited extent and absent neck pain history this is likely atheromatous disease. Moderate narrowing of the right vertebral artery as it enters the transverse foramen. Skeleton: No acute or aggressive finding. Other neck: No incidental mass or inflammation. Upper chest: No acute finding.  Mild centrilobular emphysema. Review of the MIP images confirms the above findings CTA HEAD FINDINGS Anterior circulation: Heavy calcified atheromatous plaque at both siphons with high-grade bilateral mid cavernous narrowing. Quantification is limited due to the degree of calcified plaque blooming. Hypoplastic left A1 segment, there is an azygos type A2 segment. Atherosclerotic irregularity of bilateral MCA branches without branch occlusion. Negative for aneurysm. Posterior circulation: Fairly symmetric vertebral arteries.  Bilateral V4 atherosclerosis with moderate left and advanced right stenosis at the vertebrobasilar junction. High-grade proximal basilar stenosis with extrinsic constriction. This is where filling defect was seen on catheter angiography in 2012. Severe left P1 segment stenosis with nonvisualized vessel. Bilateral atheromatous irregularity of PCA branches. Venous sinuses: Patent on the delayed phase. Anatomic variants: None significant. Delayed phase: No abnormal intracranial enhancement. Review of the MIP images confirms the above findings IMPRESSION: Head CT : 1. Questioned left insular infarct on prior head CT is not re- demonstrated. No acute finding. 2. Remote left midbrain and cerebellar infarcts. CTA neck: 1. Bilateral cervical carotid bifurcation atherosclerosis without flow limiting stenosis or ulceration. 2. Moderate narrowing at the origin of the left vertebral artery. 3. Moderate narrowing of the right vertebral artery of V1/2 junction and C1. Intracranial CTA: 1. High-grade bilateral cavernous carotid stenosis. Quantification is limited due to degree of calcified plaque which obscures the lumen. 2. Severe stenosis at the right vertebrobasilar junction. Moderate narrowing at the left vertebrobasilar junction. 3. High-grade proximal basilar stenosis. A luminal filling defect was seen at this level on 2012 catheter angiography. 4. Severe left P1 segment stenosis with no visible flow over a short segment. Electronically Signed   By: Marnee Spring M.D.   On: 10/05/2017 10:43   Ct Head Code Stroke Wo Contrast  Result Date: 10/04/2017 CLINICAL DATA:  Code  stroke.  Aphasia EXAM: CT HEAD WITHOUT CONTRAST TECHNIQUE: Contiguous axial images were obtained from the base of the skull through the vertex without intravenous contrast. COMPARISON:  Head CT 01/29/2011 FINDINGS: Brain: Questionable loss of insular ribbon anteriorly and inferiorly. No acute hemorrhage, hydrocephalus, or masslike finding. Remote  infarct of the left midbrain and upper pons. Small remote left cerebellar infarct. The cerebellum has become progressively atrophic since 2012. Vascular: Atherosclerotic calcification. No definite hyperdense vessel. Skull: No acute or aggressive finding Sinuses/Orbits: No pathologic finding.  Right cataract resection. Other: These results were called by telephone at the time of interpretation on 10/04/2017 at 8:58 am to Dr. Lynden Oxford , who verbally acknowledged these results. ASPECTS [facility] Stroke Program Early CT Score) -left hemisphere - Ganglionic level infarction (caudate, lentiform nuclei, internal capsule, insula, M1-M3 cortex): 6 - Supraganglionic infarction (M4-M6 cortex): 3 Total score (0-10 with 10 being normal): 9 IMPRESSION: 1. No acute hemorrhage. 2. Questionable, partial loss of left insular ribbon. Aspects is 9 at worst. 3. Remote left midbrain infarct. Progressive cerebellar atrophy since 2012. Electronically Signed   By: Marnee Spring M.D.   On: 10/04/2017 09:00    2D ECHO: Study Conclusions  - Left ventricle: The cavity size was normal. There was severe   concentric hypertrophy. Systolic function was normal. Wall motion   was normal; there were no regional wall motion abnormalities.   There was an increased relative contribution of atrial   contraction to ventricular filling. Doppler parameters are   consistent with abnormal left ventricular relaxation (grade 1   diastolic dysfunction). - Aorta: Aortic root dimension: 40 mm (ED). - Aortic root: The aortic root was mildly dilated. - Mitral valve: Calcified annulus. - Left atrium: The atrium was mildly dilated. - Tricuspid valve: There was trivial regurgitation.  Disposition and Follow-up: Discharge Instructions    Ambulatory referral to Neurology    Complete by:  As directed    Follow up with stroke clinic Darrol Angel preferred, if not available, then consider Sylvie Farrier, [facility] or Lucia Gaskins whoever is  available) at [facility] in about 6-8 weeks. Thanks.   Ambulatory referral to Neurology    Complete by:  As directed    An appointment is requested in approximately: 6 weeks, TIA follow-up, Dr Roda Shutters   Diet Carb Modified    Complete by:  As directed    Increase activity slowly    Complete by:  As directed        DISPOSITION: home    DISCHARGE FOLLOW-UP Follow-up Information    Schedule an appointment as soon as possible for a visit with Georgann Housekeeper, MD.   Specialty:  Internal Medicine Contact information: [address] [place] [address] [phone]        Nilda Riggs, NP. Schedule an appointment as soon as possible for a visit in 6 week(s).   Specialty:  Family Medicine Why:  stroke follow-up Contact information: [address] [place] [address] [phone]        Lyn Records, MD. Schedule an appointment as soon as possible for a visit in 3 week(s).   Specialty:  Cardiology Contact information: [address] [place] [address] [phone]        [facility] Follow up.   Why:  Take your Rx and call for soonest appt for Outpt PT/OT. Use your GPS for Directions Contact information: [address]     [address] [place] [address]  [phone]           Time spent on Discharge:   Signed:   Thad Ranger M.D. [facility] 10/06/2017, 12:08 PM Pager: [phone]

## 2017-10-06 NOTE — Progress Notes (Signed)
Pt resting in bed quietly. Easily aroused. Right sided weakness noted. Pt is incontinent of b.b. Denies any pain at this time. Wife at bedside, and around 0140 this morning, pt's wife states that she notices that pt is "confused." Upon reassessment, pt admits that he is "confused and he heard a noise int the hallway and told his wife to go and check it ouit" which is when she say me walking down the hallway. Pt was noted to be sitting on the side of the foot of the bed with his rolling walker infront of him and I began to ask where was he running off too? Validation was given as well as reassurance and he was easily reoriented. He states that he is supposed to be discharged home today, however d/c orders are not noted on his chart at this time. Bed alarm was initiated at this point for increased pt safety after his return to the bed. callbell within reach. Safety maintained. Will continue to monitor.

## 2017-10-06 NOTE — Progress Notes (Signed)
D/C instructions provided to patient and spouse. Denies additional questions/concerns at this time. Pt taken to front entrance via WC, tol well.

## 2017-10-10 DIAGNOSIS — E119 Type 2 diabetes mellitus without complications: Secondary | ICD-10-CM | POA: Diagnosis not present

## 2017-10-10 DIAGNOSIS — G459 Transient cerebral ischemic attack, unspecified: Secondary | ICD-10-CM | POA: Diagnosis not present

## 2017-10-10 DIAGNOSIS — M6289 Other specified disorders of muscle: Secondary | ICD-10-CM | POA: Diagnosis not present

## 2017-10-10 DIAGNOSIS — I69359 Hemiplegia and hemiparesis following cerebral infarction affecting unspecified side: Secondary | ICD-10-CM | POA: Diagnosis not present

## 2017-11-11 ENCOUNTER — Encounter: Payer: Self-pay | Admitting: Family

## 2017-11-11 ENCOUNTER — Ambulatory Visit (INDEPENDENT_AMBULATORY_CARE_PROVIDER_SITE_OTHER): Payer: Medicare Other | Admitting: Family

## 2017-11-11 ENCOUNTER — Ambulatory Visit (INDEPENDENT_AMBULATORY_CARE_PROVIDER_SITE_OTHER)
Admission: RE | Admit: 2017-11-11 | Discharge: 2017-11-11 | Disposition: A | Discharge status: Home / Self Care | Payer: Medicare Other | Source: Ambulatory Visit | Attending: Surgery | Admitting: Surgery

## 2017-11-11 ENCOUNTER — Ambulatory Visit (HOSPITAL_COMMUNITY)
Admission: RE | Admit: 2017-11-11 | Discharge: 2017-11-11 | Disposition: A | Discharge status: Home / Self Care | Payer: Medicare Other | Source: Ambulatory Visit | Attending: Surgery | Admitting: Surgery

## 2017-11-11 VITALS — BP 140/88 | HR 63 | Temp 97.5°F | Resp 17 | Wt 267.6 lb

## 2017-11-11 DIAGNOSIS — Z87891 Personal history of nicotine dependence: Secondary | ICD-10-CM | POA: Insufficient documentation

## 2017-11-11 DIAGNOSIS — R7303 Prediabetes: Secondary | ICD-10-CM | POA: Insufficient documentation

## 2017-11-11 DIAGNOSIS — I739 Peripheral vascular disease, unspecified: Secondary | ICD-10-CM | POA: Diagnosis not present

## 2017-11-11 DIAGNOSIS — I10 Essential (primary) hypertension: Secondary | ICD-10-CM | POA: Insufficient documentation

## 2017-11-11 DIAGNOSIS — I779 Disorder of arteries and arterioles, unspecified: Secondary | ICD-10-CM

## 2017-11-11 DIAGNOSIS — Z95828 Presence of other vascular implants and grafts: Secondary | ICD-10-CM

## 2017-11-11 NOTE — Progress Notes (Signed)
[facility] [place]   CC: Follow up peripheral artery occlusive disease  History of Present Illness [name] is a 71 y.o. male who is status post left femoropopliteal bypass graft in 2011 by Dr. Myra GianottiBrabham.  He returns today for follow up. He reports that he had a stroke during his cardiac stent placement in 2012, no further stroke symptoms since 2012, carotid Duplex in 2014 revealed minimal bilateral ICA stenosis. He has right leg and arm residual weakness, has mild right facial droop, he is able to cut grass with his riding lawn mower. He denies claudication symptoms with walking, he is continuing to work on his rehab at home. He denies non-healing wounds. He is exercising daily including 5 miles daily on a recumbent bike.  Pt states his systolic blood pressure at home is about 130, states it increases in a medical setting.    Pt Diabetic: 6.3  A1C on 10-05-17 Pt smoker: former smoker, quit 1994  Pt meds include:  Statin :Yes Betablocker: Yes ASA: Yes Other anticoagulants/antiplatelets: no    Past Medical History:  Diagnosis Date  . Arthritis    Gout  . Coronary artery disease   . Diabetes mellitus   . Gout   . Myocardial infarction (HCC) 2011  . Peripheral vascular disease (HCC)   . Stroke [facility](HCC) feb 2012   complication of cath    Social History Social History   Tobacco Use  . Smoking status: Former Smoker    Years: 40.00  . Smokeless tobacco: Never Used  Substance Use Topics  . Alcohol use: No    Alcohol/week: 0.0 oz  . Drug use: No    Family History Family History  Problem Relation Age of Onset  . Heart disease Mother   . Heart disease Father   . Diabetes Father   . Heart attack Father     Past Surgical History:  Procedure Laterality Date  . CARDIAC CATHETERIZATION     stent done feb 2012  . CORONARY ARTERY BYPASS GRAFT    . FEMORAL BYPASS Left Sept. 30, 2011   Left Fem to above knee Pop Artery BPG  .  VASCULAR SURGERY      No Known Allergies  Current Outpatient Medications  Medication Sig Dispense Refill  . allopurinol (ZYLOPRIM) 100 MG tablet Take 100 mg by mouth daily.    Marland Kitchen. aspirin 325 MG tablet Take 325 mg by mouth daily.    Marland Kitchen. atenolol (TENORMIN) 50 MG tablet Take 0.5 tablets (25 mg total) by mouth 2 (two) times daily. 30 tablet 10  . atorvastatin (LIPITOR) 40 MG tablet Take 1 tablet (40 mg total) by mouth daily. 90 tablet 3  . clopidogrel (PLAVIX) 75 MG tablet Take 1 tablet (75 mg total) by mouth daily. 90 tablet 2  . glipiZIDE (GLUCOTROL) 5 MG tablet Take 2.5 mg by mouth 2 (two) times daily before a meal.     . lisinopril (PRINIVIL,ZESTRIL) 2.5 MG tablet Take 2.5 mg by mouth daily.     . nitroGLYCERIN (NITROSTAT) 0.4 MG SL tablet Place 1 tablet (0.4 mg total) under the tongue every 5 (five) minutes as needed for chest pain. 25 tablet 3  . tiZANidine (ZANAFLEX) 2 MG tablet TAKE 1 TABLET TWICE A DAY BEFORE A MEAL (Patient taking differently: TAKE 1 TABLET (2mg ) TWICE A DAY BEFORE A MEAL) 60 tablet 5  . UNABLE TO FIND Outpatient physical therapy  Diagnosis: TIA 1 Mutually Defined 0   No current facility-administered medications  for this visit.     ROS: See HPI for pertinent positives and negatives.   Physical Examination  Vitals:   11/11/17 1104 11/11/17 1109  BP: (!) 133/95 140/88  Pulse: 63   Resp: 17   Temp: (!) 97.5 F (36.4 C)   TempSrc: Oral   SpO2: 96%   Weight: 267 lb 9.6 oz (121.4 kg)    Body mass index is 35.31 kg/m.  General: A&O x 3, WDWN, obese male.  Gait: using rolling walker, mild weakness in right arm and leg Eyes: PERRLA  Pulmonary: Respirations are non labored, CTAB, without wheezes , rales or rhonchi  Cardiac: regular rhythm, no detected murmur.   Carotid Bruits Left Right   Negative  Negative    Abdominal aortic pulse is not palpable  Radial pulses: 2+ palpable and equal.   VASCULAR EXAM: Extremitieswithout  ischemic changes  without Gangrene; without open wounds.   LE Pulses  LEFT  RIGHT   POPLITEAL  not palpable  not palpable  POSTERIOR TIBIAL  not palpable  not palpable   DORSALIS PEDIS ANTERIOR TIBIAL  palpable  palpable    Abdomen: soft, NT, no masses palpated.  Skin: no rashes, no ulcers.  Musculoskeletal: no muscle wasting or atrophy.  Neurologic: A&O X 3; appropriate affect ; SENSATION: normal; MOTOR FUNCTION: moving all extremities equally, motor strength 4/5 in right upper and lower extremities, 5/5 in left upper and lower extremities.  Speech is fluent/aphasic. CN 2-12 intact except right facial droop with smile, right shoulder shrug is less than left.    ASSESSMENT: Craig DeedClarence M [name] is a 71 y.o. male whois status post left femoropopliteal bypass graft in 2011. He had a stroke during his cardiac stent placement in 2012, no further stroke symptoms since 2012, carotid Duplex in 2014 revealed minimal bilateral ICA stenosis. He has right leg and arm residual weakness, has right facial droop, he is able to cut grass with his riding lawn mower. He has no claudication symptoms with walking, he is continuing to work on his rehab at home, walks with a walker. He has no signs of ischemia in his legs/feet. He is exercising daily including 5 miles daily on a recumbent bike.  DATA  Left LE Arterial Duplex (11/11/17):  Patent left femoral to popliteal artery bypass graft with no evidence for restenosis. No change since the exam on 11-05-16.   ABI (Date: 11/11/2017):  R:   ABI: 1.01 (was 1.07 on 11-05-16),   PT: tri  DP: bi  TBI:  0.92  L:   ABI: 1.08 (was 1.05),   PT: tri  DP: tri  TBI: 0.90 Bilateral ABI and TBI remain normal with tri and biphasic waveforms.   PLAN:  Continue extensive exercising.  Based on the patient's vascular studies and examination, pt will return to clinic in 1 year withleft LE arterial Duplex and  ABI's.   I discussed in depth with the patient the nature of atherosclerosis, and emphasized the importance of maximal medical management including strict control of blood pressure, blood glucose, and lipid levels, obtaining regular exercise, and continued cessation of smoking.  The patient is aware that without maximal medical management the underlying atherosclerotic disease process will progress, limiting the benefit of any interventions.  The patient was given information about PAD including signs, symptoms, treatment, what symptoms should prompt the patient to seek immediate medical care, and risk reduction measures to take.  Charisse MarchSuzanne Lillis Nuttle, RN, MSN, FNP-C [facility] [place] Office Phone: [phone]  Clinic MD: Myra Gianotti  11/11/17 11:13 AM

## 2017-11-11 NOTE — Patient Instructions (Signed)

## 2017-11-13 ENCOUNTER — Encounter: Payer: Self-pay | Admitting: Interventional Cardiology

## 2017-11-13 NOTE — Addendum Note (Signed)
Addended by: Burton ApleyPETTY, Katiana Ruland A on: 11/13/2017 10:55 AM   Modules accepted: Orders

## 2017-11-24 NOTE — Progress Notes (Signed)
Cardiology Office Note    Date:  11/25/2017   ID:  [name], [name] 12/27/1945, MRN [id number]  PCP:  Wenda Low, MD  Cardiologist: Sinclair Grooms, MD   Chief Complaint  Patient presents with  . Coronary Artery Disease  . Transient Ischemic Attack    History of Present Illness:  [name] is a 71 y.o. male  With CAD, CABG, Prior MI, prior embolic posterior circulation stroke during cath, hypertension, hyperlipidemia, DM II, and PAD with claudication. Recent observation for TIA,  October admission for slurred speech. Hospitalized and had workup including CT scan of brain and CTA of brain. Progression of intracranial cerebrovascular disease is noted. Symptoms of dysarthria resolved spontaneously. Plavix added to aspirin.  No cardiac complaints. Specifically denies chest pain and shortness of breath. Cardiac evaluation included an echocardiogram which demonstrated normal LV function. He denies syncope, palpitations, chest heaviness and tightness.   Past Medical History:  Diagnosis Date  . Arthritis    Gout  . Coronary artery disease   . Diabetes mellitus   . Gout   . Myocardial infarction ([place]) 2011  . Peripheral vascular disease ([place])   . Stroke [facility]) feb 2012   complication of cath    Past Surgical History:  Procedure Laterality Date  . CARDIAC CATHETERIZATION     stent done feb 2012  . CORONARY ARTERY BYPASS GRAFT    . FEMORAL BYPASS Left Sept. 30, 2011   Left Fem to above knee Pop Artery BPG  . VASCULAR SURGERY      Current Medications: Outpatient Medications Prior to Visit  Medication Sig Dispense Refill  . allopurinol (ZYLOPRIM) 100 MG tablet Take 100 mg by mouth daily.    Marland Kitchen aspirin 325 MG tablet Take 325 mg by mouth daily.    Marland Kitchen atenolol (TENORMIN) 50 MG tablet Take 0.5 tablets (25 mg total) by mouth 2 (two) times daily. 30 tablet 10  . atorvastatin (LIPITOR) 40 MG tablet Take 1 tablet (40 mg total) by mouth daily. 90 tablet 3  .  clopidogrel (PLAVIX) 75 MG tablet Take 1 tablet (75 mg total) by mouth daily. 90 tablet 2  . glipiZIDE (GLUCOTROL) 5 MG tablet Take 2.5 mg by mouth 2 (two) times daily before a meal.     . lisinopril (PRINIVIL,ZESTRIL) 2.5 MG tablet Take 2.5 mg by mouth daily.     . nitroGLYCERIN (NITROSTAT) 0.4 MG SL tablet Place 1 tablet (0.4 mg total) under the tongue every 5 (five) minutes as needed for chest pain. 25 tablet 3  . tiZANidine (ZANAFLEX) 2 MG tablet TAKE 1 TABLET TWICE A DAY BEFORE A MEAL (Patient taking differently: TAKE 1 TABLET (74m) TWICE A DAY BEFORE A MEAL) 60 tablet 5  . UNABLE TO FIND Outpatient physical therapy  Diagnosis: TIA 1 Mutually Defined 0   No facility-administered medications prior to visit.      Allergies:   Patient has no known allergies.   Social History   Socioeconomic History  . Marital status: Married    Spouse name: None  . Number of children: None  . Years of education: None  . Highest education level: None  Social Needs  . Financial resource strain: None  . Food insecurity - worry: None  . Food insecurity - inability: None  . Transportation needs - medical: None  . Transportation needs - non-medical: None  Occupational History  . None  Tobacco Use  . Smoking status: Former Smoker    Years: 40.00  .  Smokeless tobacco: Never Used  Substance and Sexual Activity  . Alcohol use: No    Alcohol/week: 0.0 oz  . Drug use: No  . Sexual activity: None  Other Topics Concern  . None  Social History Narrative  . None     Family History:  The patient's family history includes Diabetes in his father; Heart attack in his father; Heart disease in his father and mother.   ROS:   Please see the history of present illness.    None other than frustration due to right hemiparesis associated with embolic CVA . No blood in the urine or stool. All other systems reviewed and are negative.   PHYSICAL EXAM:   VS:  BP (!) 152/86   Pulse 65   Ht 6' (1.829 m)    Wt 271 lb 9.6 oz (123.2 kg)   BMI 36.84 kg/m    GEN: Well nourished, well developed, in no acute distress  HEENT: normal  Neck: no JVD, carotid bruits, or masses Cardiac: RRR; no murmurs, rubs, or gallops,no edema  Respiratory:  clear to auscultation bilaterally, normal work of breathing GI: soft, nontender, nondistended, + BS MS: no deformity or atrophy  Skin: warm and dry, no rash Neuro:  Alert and Oriented x 3, Strength and sensation are intact Psych: euthymic mood, full affect  Wt Readings from Last 3 Encounters:  11/25/17 271 lb 9.6 oz (123.2 kg)  11/11/17 267 lb 9.6 oz (121.4 kg)  10/05/17 262 lb 9.1 oz (119.1 kg)      Studies/Labs Reviewed:   EKG:  EKG  sinus rhythm with evidence of old anterior and inferior Q-wave infarction. No change when compared to 07/19/2014. This particular tracing was performed on 10/05/17.  Recent Labs: 10/04/2017: ALT 25; Hemoglobin 17.0; Platelets 202 10/05/2017: BUN 7; Creatinine, Ser 1.04; Potassium 4.2; Sodium 138; TSH 1.439   Lipid Panel    Component Value Date/Time   CHOL 135 10/05/2017 0523   CHOL 166 03/18/2015 1144   TRIG 87 10/05/2017 0523   HDL 30 (L) 10/05/2017 0523   HDL 37 (L) 03/18/2015 1144   CHOLHDL 4.5 10/05/2017 0523   VLDL 17 10/05/2017 0523   LDLCALC 88 10/05/2017 0523   LDLCALC 100 (H) 03/18/2015 1144    Additional studies/ records that were reviewed today include:  2-D Doppler echocardiogram 10/05/2017: Study Conclusions   - Left ventricle: The cavity size was normal. There was severe   concentric hypertrophy. Systolic function was normal. Wall motion   was normal; there were no regional wall motion abnormalities.   There was an increased relative contribution of atrial   contraction to ventricular filling. Doppler parameters are   consistent with abnormal left ventricular relaxation (grade 1   diastolic dysfunction). - Aorta: Aortic root dimension: 40 mm (ED). - Aortic root: The aortic root was mildly  dilated. - Mitral valve: Calcified annulus. - Left atrium: The atrium was mildly dilated. - Tricuspid valve: There was trivial regurgitation.  ABI 11/11/2017: Normal ABI bilaterally.  CT brain and CT angiogram 10/05/2017: IMPRESSION: Head CT :   1. Questioned left insular infarct on prior head CT is not re- demonstrated. No acute finding. 2. Remote left midbrain and cerebellar infarcts.   CTA neck:   1. Bilateral cervical carotid bifurcation atherosclerosis without flow limiting stenosis or ulceration. 2. Moderate narrowing at the origin of the left vertebral artery. 3. Moderate narrowing of the right vertebral artery of V1/2 junction and C1.   Intracranial CTA:   1. High-grade bilateral  cavernous carotid stenosis. Quantification is limited due to degree of calcified plaque which obscures the lumen. 2. Severe stenosis at the right vertebrobasilar junction. Moderate narrowing at the left vertebrobasilar junction. 3. High-grade proximal basilar stenosis. A luminal filling defect was seen at this level on 2012 catheter angiography. 4. Severe left P1 segment stenosis with no visible flow over a short segment.     ASSESSMENT:    1. Coronary artery disease of bypass graft of native heart with stable angina pectoris ([place])   2. Essential hypertension   3. Peripheral vascular disease, unspecified ([place])   4. Cerebrovascular accident (CVA) due to bilateral embolism of vertebral arteries ([place])   5. Type 2 diabetes mellitus with other circulatory complication, without long-term current use of insulin (HCC)      PLAN:  In order of problems listed above:  1. Coronary disease is stable. Evidence of previous Q-wave infarction. LV systolic function however is normal based upon recent echo. Notify us of angina. Use nitroglycerin if angina. 2. I believe the blood pressures little high. We should attempt less than 140/90 mmHg. If neurology agrees, I will recommend increasing  lisinopril to 5 or preferably 10 mg per day with a follow-up be met 7-10 days later. 3. Stable without evidence of progression. 4. Currently on full dose aspirin and Plavix. Question whether aspirin dose can be decreased to 81 mg per day. There is progression of intracranial atherosclerosis. Risk factor modification as tolerated. Better blood pressure control might help. 5. Target LDL of niece to be less than 6.5.  Overall doing well. Still frustrated by her immobility. Improved compared to before. Progression of intracranial atherosclerosis. With neurology's consent consider decreasing aspirin 81 mg per day and up titrating ACE inhibitor therapy for better blood pressure control.  Clinical follow-up in one year.    Medication Adjustments/Labs and Tests Ordered: Current medicines are reviewed at length with the patient today.  Concerns regarding medicines are outlined above.  Medication changes, Labs and Tests ordered today are listed in the Patient Instructions below. Patient Instructions  Medication Instructions:  Your physician recommends that you continue on your current medications as directed. Please refer to the Current Medication list given to you today.  Labwork: None  Testing/Procedures: None  Follow-Up: Your physician wants you to follow-up in: 1 year with Dr. Tamala Julian.  You will receive a reminder letter in the mail two months in advance. If you don't receive a letter, please call our office to schedule the follow-up appointment.   Any Other Special Instructions Will Be Listed Below (If Applicable).     If you need a refill on your cardiac medications before your next appointment, please call your pharmacy.      Signed, Sinclair Grooms, MD  11/25/2017 11:31 AM    [facility] [place], [place], [place]  [address] Phone: [phone]; Fax: [phone]

## 2017-11-25 ENCOUNTER — Encounter (INDEPENDENT_AMBULATORY_CARE_PROVIDER_SITE_OTHER): Payer: Self-pay

## 2017-11-25 ENCOUNTER — Encounter: Payer: Self-pay | Admitting: Interventional Cardiology

## 2017-11-25 ENCOUNTER — Ambulatory Visit (INDEPENDENT_AMBULATORY_CARE_PROVIDER_SITE_OTHER): Payer: Medicare Other | Admitting: Interventional Cardiology

## 2017-11-25 VITALS — BP 152/86 | HR 65 | Ht 72.0 in | Wt 271.6 lb

## 2017-11-25 DIAGNOSIS — E1159 Type 2 diabetes mellitus with other circulatory complications: Secondary | ICD-10-CM

## 2017-11-25 DIAGNOSIS — I739 Peripheral vascular disease, unspecified: Secondary | ICD-10-CM

## 2017-11-25 DIAGNOSIS — I63113 Cerebral infarction due to embolism of bilateral vertebral arteries: Secondary | ICD-10-CM

## 2017-11-25 DIAGNOSIS — I25708 Atherosclerosis of coronary artery bypass graft(s), unspecified, with other forms of angina pectoris: Secondary | ICD-10-CM | POA: Diagnosis not present

## 2017-11-25 DIAGNOSIS — I209 Angina pectoris, unspecified: Secondary | ICD-10-CM

## 2017-11-25 DIAGNOSIS — I10 Essential (primary) hypertension: Secondary | ICD-10-CM | POA: Diagnosis not present

## 2017-11-25 DIAGNOSIS — I779 Disorder of arteries and arterioles, unspecified: Secondary | ICD-10-CM

## 2017-11-25 NOTE — Patient Instructions (Signed)

## 2017-12-18 NOTE — Progress Notes (Signed)
[facility]  PATIENT: Zenaida DeedClarence M Maybury DOB: 1946/05/18   REASON FOR VISIT: Hospital follow-up for TIA likely due to severe intracranial stenosis HISTORY FROM: Patient and wife    HISTORY OF PRESENT ILLNESS:FROM RECORDHis wife and daughters are at the bedside.  Overall he feels his condition is completely resolved. Patient and daughter stated that he had a sudden onset slurry speech, resolved in 5 minutes. EMS called, patient sent to ER. However, patient had several similar episodes after that, all resolved. Patient now at his baseline. Pt is on ASA at home.  Interval history 12/27/2018CM Mr. Mitzie NaBigelow, 71 year old male returns for hospital follow-up for TIA likely due to severe intracranial stenosis.  CT without acute finding.  CTA head and neck bilateral high-grade stenosis with similar findings on previous study in 2012.  Carotid Doppler unremarkable.  LDL 88 hemoglobin A1c 6.3.  He remains on Plavix and aspirin for secondary stroke prevention and cardiac prevention.  He has not had further stroke or TIA symptoms.  He has minimal bruising and no bleeding.  He remains on Lipitor without myalgias.  Blood pressure in the office today 161/95.  He has an AFO to right lower extremity due to previous stroke.  He has chronic right hemiparesis and ambulates with a walker.  He is currently not receiving any rehab therapy.    REVIEW OF SYSTEMS: Full 14 system review of systems performed and notable only for those listed, all others are neg:  Constitutional: neg  Cardiovascular: neg Ear/Nose/Throat: neg  Skin: neg Eyes: neg Respiratory: neg Gastroitestinal: neg  Hematology/Lymphatic: neg  Endocrine: neg Musculoskeletal: Gait abnormality Allergy/Immunology: neg Neurological: neg Psychiatric: neg Sleep : neg   ALLERGIES: No Known Allergies  HOME MEDICATIONS: Outpatient Medications Prior to Visit  Medication Sig Dispense Refill  . allopurinol (ZYLOPRIM) 100 MG tablet  Take 100 mg by mouth daily.    Marland Kitchen. aspirin 325 MG tablet Take 325 mg by mouth daily.    Marland Kitchen. atenolol (TENORMIN) 50 MG tablet Take 0.5 tablets (25 mg total) by mouth 2 (two) times daily. 30 tablet 10  . atorvastatin (LIPITOR) 40 MG tablet Take 1 tablet (40 mg total) by mouth daily. 90 tablet 3  . clopidogrel (PLAVIX) 75 MG tablet Take 1 tablet (75 mg total) by mouth daily. 90 tablet 2  . glipiZIDE (GLUCOTROL) 5 MG tablet Take 2.5 mg by mouth 2 (two) times daily before a meal.     . lisinopril (PRINIVIL,ZESTRIL) 2.5 MG tablet Take 2.5 mg by mouth daily.     . nitroGLYCERIN (NITROSTAT) 0.4 MG SL tablet Place 1 tablet (0.4 mg total) under the tongue every 5 (five) minutes as needed for chest pain. 25 tablet 3  . tiZANidine (ZANAFLEX) 2 MG tablet TAKE 1 TABLET TWICE A DAY BEFORE A MEAL (Patient taking differently: TAKE 1 TABLET (2mg ) TWICE A DAY BEFORE A MEAL) 60 tablet 5  . UNABLE TO FIND Outpatient physical therapy  Diagnosis: TIA 1 Mutually Defined 0   No facility-administered medications prior to visit.     PAST MEDICAL HISTORY: Past Medical History:  Diagnosis Date  . Arthritis    Gout  . Coronary artery disease   . Diabetes mellitus   . Gout   . Myocardial infarction (HCC) 2011  . Peripheral vascular disease (HCC)   . Stroke [facility](HCC) feb 2012   complication of cath    PAST SURGICAL HISTORY: Past Surgical History:  Procedure Laterality Date  . CARDIAC CATHETERIZATION     stent done  feb 2012  . CORONARY ARTERY BYPASS GRAFT    . FEMORAL BYPASS Left Sept. 30, 2011   Left Fem to above knee Pop Artery [facility]  . VASCULAR SURGERY      FAMILY HISTORY: Family History  Problem Relation Age of Onset  . Heart disease Mother   . Heart disease Father   . Diabetes Father   . Heart attack Father     SOCIAL HISTORY: Social History   Socioeconomic History  . Marital status: Married    Spouse name: Not on file  . Number of children: Not on file  . Years of education: Not on file  .  Highest education level: Not on file  Social Needs  . Financial resource strain: Not on file  . Food insecurity - worry: Not on file  . Food insecurity - inability: Not on file  . Transportation needs - medical: Not on file  . Transportation needs - non-medical: Not on file  Occupational History  . Not on file  Tobacco Use  . Smoking status: Former Smoker    Years: 40.00  . Smokeless tobacco: Never Used  Substance and Sexual Activity  . Alcohol use: No    Alcohol/week: 0.0 oz  . Drug use: No  . Sexual activity: Not on file  Other Topics Concern  . Not on file  Social History Narrative  . Not on file     PHYSICAL EXAM  Vitals:   12/19/17 1339  BP: (!) 161/95  Pulse: 70  Weight: 276 lb 12.8 oz (125.6 kg)  Height: 6' (1.829 m)   Body mass index is 37.54 kg/m.  Generalized: Well developed, in no acute distress  Head: normocephalic and atraumatic,. Oropharynx benign  Neck: Supple, no carotid bruits  Cardiac: Regular rate rhythm, no murmur  Musculoskeletal: No deformity   Neurological examination   Mentation: Alert oriented to time, place, history taking. Attention span and concentration appropriate. Recent and remote memory intact.  Follows all commands speech and language fluent.   Cranial nerve II-XII: Pupils were equal round reactive to light extraocular movements were full, visual field were full on confrontational test.  Right facial droop which is chronic . hearing was intact to finger rubbing bilaterally. Uvula tongue midline. head turning and shoulder shrug were normal and symmetric.Tongue protrusion into cheek strength was normal. Motor: normal bulk and tone, full strength in the BUE, BLE, on the left however extremity 3 out of 5 proximally and distally and 4 out of 5 right lower extremity with foot drop and AFO.  Sensory: normal and symmetric to light touch, pinprick, and  Vibration,  Coordination: finger-nose-finger, with significant ataxia on the right ,  normal on the left Reflexes: Increased on the right as compared to the left plantar responses were flexor bilaterally. Gait and Station: Rising up from seated position with pushoff , ambulated a short distance in the hall with walker, obvious with right foot drop.  No difficulty with turns.  DIAGNOSTIC DATA (LABS, IMAGING, TESTING) - I reviewed patient records, labs, notes, testing and imaging myself where available.  Lab Results  Component Value Date   WBC 6.6 10/04/2017   HGB 17.0 10/04/2017   HCT 50.0 10/04/2017   MCV 92.4 10/04/2017   PLT 202 10/04/2017      Component Value Date/Time   NA 138 10/05/2017 0523   K 4.2 10/05/2017 0523   CL 104 10/05/2017 0523   CO2 27 10/05/2017 0523   GLUCOSE 95 10/05/2017 0523   BUN  7 10/05/2017 0523   CREATININE 1.04 10/05/2017 0523   CALCIUM 8.8 (L) 10/05/2017 0523   PROT 6.7 10/04/2017 0845   ALBUMIN 3.4 (L) 10/04/2017 0845   AST 27 10/04/2017 0845   ALT 25 10/04/2017 0845   ALKPHOS 77 10/04/2017 0845   BILITOT 1.0 10/04/2017 0845   GFRNONAA >60 10/05/2017 0523   GFRAA >60 10/05/2017 0523   Lab Results  Component Value Date   CHOL 135 10/05/2017   HDL 30 (L) 10/05/2017   LDLCALC 88 10/05/2017   TRIG 87 10/05/2017   CHOLHDL 4.5 10/05/2017   Lab Results  Component Value Date   HGBA1C 6.3 (H) 10/05/2017   No results found for: [id number] Lab Results  Component Value Date   TSH 1.439 10/05/2017     ASSESSMENT AND PLAN  71 y.o. year old male with history of coronary artery disease, Diabetes mellitus, Gout, Myocardial infarction (HCC) (2011), Peripheral vascular disease (HCC), and Stroke (HCC) (feb 2012). here after recent hospitalization for TIA in October likely due to severe intracranial stenosis.  CT without acute finding.  CTA head and neck bilateral high-grade stenosis with similar findings on previous study in 2012.  Carotid Doppler unremarkable.  LDL 88 hemoglobin A1c 6.3.  He remains on Plavix and aspirin for secondary  stroke prevention and cardiac prevention.The patient is a current patient of Dr. Roda Shutters  who is out of the office today . This note is sent to the work in doctor.     Stressed the importance of management of risk factors to prevent further stroke Continue Plavix and for secondary stroke prevention and cardiac prevention Maintain strict control of hypertension with blood pressure goal below 130-150 systolic today's reading 161/95 continue antihypertensive medications Control of diabetes with hemoglobin A1c below 6.5 followed by primary care most recent hemoglobin A1c 6.3 continue Glucotrol Cholesterol with LDL cholesterol less than 70, followed by primary care,  most recent 88 continue statin drugs Lipitor Exercise by walking, with walker every day eat healthy diet with whole grains,  fresh fruits and vegetables, Mediterranean diet preferred, this was reviewed with patient and given a copy Follow-up in 6 months Discussed risk for recurrent stroke/ TIA and answered additional questions This was a  visit requiring 30 minutes and medical decision making of high complexity with extensive review of history, hospital chart, counseling and answering questions Nilda Riggs, [facility], [facility], APRN  [facility] [address], [address] [place], [place] [address] [phone]

## 2017-12-19 ENCOUNTER — Encounter: Payer: Self-pay | Admitting: Nurse Practitioner

## 2017-12-19 ENCOUNTER — Ambulatory Visit (INDEPENDENT_AMBULATORY_CARE_PROVIDER_SITE_OTHER): Payer: Medicare Other | Admitting: Nurse Practitioner

## 2017-12-19 VITALS — BP 161/95 | HR 70 | Ht 72.0 in | Wt 276.8 lb

## 2017-12-19 DIAGNOSIS — I69359 Hemiplegia and hemiparesis following cerebral infarction affecting unspecified side: Secondary | ICD-10-CM

## 2017-12-19 DIAGNOSIS — I779 Disorder of arteries and arterioles, unspecified: Secondary | ICD-10-CM

## 2017-12-19 DIAGNOSIS — E1159 Type 2 diabetes mellitus with other circulatory complications: Secondary | ICD-10-CM | POA: Diagnosis not present

## 2017-12-19 DIAGNOSIS — E782 Mixed hyperlipidemia: Secondary | ICD-10-CM

## 2017-12-19 DIAGNOSIS — G459 Transient cerebral ischemic attack, unspecified: Secondary | ICD-10-CM | POA: Diagnosis not present

## 2017-12-19 DIAGNOSIS — I10 Essential (primary) hypertension: Secondary | ICD-10-CM

## 2017-12-19 NOTE — Patient Instructions (Addendum)
Stressed the importance of management of risk factors to prevent further stroke Continue Plavix and for secondary stroke prevention and cardiac prevention Maintain strict control of hypertension with blood pressure goal below 130-150 systolic today's reading 161/95 continue antihypertensive medications Control of diabetes with hemoglobin A1c below 6.5 followed by primary care most recent hemoglobin A1c 6.3 continue Glucotrol Cholesterol with LDL cholesterol less than 70, followed by primary care,  most recent 88 continue statin drugs Lipitor Exercise by walking, with walker every eat healthy diet with whole grains,  fresh fruits and vegetables, Mediterranean diet preferred Follow-up in 6 months Mediterranean Diet A Mediterranean diet refers to food and lifestyle choices that are based on the traditions of countries located on the [place]. This way of eating has been shown to help prevent certain conditions and improve outcomes for people who have chronic diseases, like kidney disease and heart disease. What are tips for following this plan? Lifestyle  Cook and eat meals together with your family, when possible.  Drink enough fluid to keep your urine clear or pale yellow.  Be physically active every day. This includes: ? Aerobic exercise like running or swimming. ? Leisure activities like gardening, walking, or housework.  Get 7-8 hours of sleep each night.  If recommended by your health care provider, drink red wine in moderation. This means 1 glass a day for nonpregnant women and 2 glasses a day for men. A glass of wine equals 5 oz (150 mL). Reading food labels  Check the serving size of packaged foods. For foods such as rice and pasta, the serving size refers to the amount of cooked product, not dry.  Check the total fat in packaged foods. Avoid foods that have saturated fat or trans fats.  Check the ingredients list for added sugars, such as corn syrup. Shopping  At the  grocery store, buy most of your food from the areas near the walls of the store. This includes: ? Fresh fruits and vegetables (produce). ? Grains, beans, nuts, and seeds. Some of these may be available in unpackaged forms or large amounts (in bulk). ? Fresh seafood. ? Poultry and eggs. ? Low-fat dairy products.  Buy whole ingredients instead of prepackaged foods.  Buy fresh fruits and vegetables in-season from local farmers markets.  Buy frozen fruits and vegetables in resealable bags.  If you do not have access to quality fresh seafood, buy precooked frozen shrimp or canned fish, such as tuna, salmon, or sardines.  Buy small amounts of raw or cooked vegetables, salads, or olives from the deli or salad bar at your store.  Stock your pantry so you always have certain foods on hand, such as olive oil, canned tuna, canned tomatoes, rice, pasta, and beans. Cooking  Cook foods with extra-virgin olive oil instead of using butter or other vegetable oils.  Have meat as a side dish, and have vegetables or grains as your main dish. This means having meat in small portions or adding small amounts of meat to foods like pasta or stew.  Use beans or vegetables instead of meat in common dishes like chili or lasagna.  Experiment with different cooking methods. Try roasting or broiling vegetables instead of steaming or sauteing them.  Add frozen vegetables to soups, stews, pasta, or rice.  Add nuts or seeds for added healthy fat at each meal. You can add these to yogurt, salads, or vegetable dishes.  Marinate fish or vegetables using olive oil, lemon juice, garlic, and fresh herbs. Meal planning  Plan to eat 1 vegetarian meal one day each week. Try to work up to 2 vegetarian meals, if possible.  Eat seafood 2 or more times a week.  Have healthy snacks readily available, such as: ? Vegetable sticks with hummus. ? [place] yogurt. ? Fruit and nut trail mix.  Eat balanced meals throughout the  week. This includes: ? Fruit: 2-3 servings a day ? Vegetables: 4-5 servings a day ? Low-fat dairy: 2 servings a day ? Fish, poultry, or lean meat: 1 serving a day ? Beans and legumes: 2 or more servings a week ? Nuts and seeds: 1-2 servings a day ? Whole grains: 6-8 servings a day ? Extra-virgin olive oil: 3-4 servings a day  Limit red meat and sweets to only a few servings a month What are my food choices?  Mediterranean diet ? Recommended ? Grains: Whole-grain pasta. Brown rice. Bulgar wheat. Polenta. Couscous. Whole-wheat bread. Orpah Cobbatmeal. Quinoa. ? Vegetables: Artichokes. Beets. Broccoli. Cabbage. Carrots. Eggplant. Green beans. Chard. Kale. Spinach. Onions. Leeks. Peas. Squash. Tomatoes. Peppers. Radishes. ? Fruits: Apples. Apricots. Avocado. Berries. Bananas. Cherries. Dates. Figs. Grapes. Lemons. Melon. Oranges. Peaches. Plums. Pomegranate. ? Meats and other protein foods: Beans. Almonds. Sunflower seeds. Pine nuts. Peanuts. Cod. Salmon. Scallops. Shrimp. Tuna. Tilapia. Clams. Oysters. Eggs. ? Dairy: Low-fat milk. Cheese. Greek yogurt. ? Beverages: Water. Red wine. Herbal tea. ? Fats and oils: Extra virgin olive oil. Avocado oil. Grape seed oil. ? Sweets and desserts: [place] yogurt with honey. Baked apples. Poached pears. Trail mix. ? Seasoning and other foods: Basil. Cilantro. Coriander. Cumin. Mint. Parsley. Sage. Rosemary. Tarragon. Garlic. Oregano. Thyme. Pepper. Balsalmic vinegar. Tahini. Hummus. Tomato sauce. Olives. Mushrooms. ? Limit these ? Grains: Prepackaged pasta or rice dishes. Prepackaged cereal with added sugar. ? Vegetables: Deep fried potatoes (french fries). ? Fruits: Fruit canned in syrup. ? Meats and other protein foods: Beef. Pork. Lamb. Poultry with skin. Hot dogs. Tomasa BlaseBacon. ? Dairy: Ice cream. Sour cream. Whole milk. ? Beverages: Juice. Sugar-sweetened soft drinks. Beer. Liquor and spirits. ? Fats and oils: Butter. Canola oil. Vegetable oil. Beef fat (tallow).  Lard. ? Sweets and desserts: Cookies. Cakes. Pies. Candy. ? Seasoning and other foods: Mayonnaise. Premade sauces and marinades. ? The items listed may not be a complete list. Talk with your dietitian about what dietary choices are right for you. Summary  The Mediterranean diet includes both food and lifestyle choices.  Eat a variety of fresh fruits and vegetables, beans, nuts, seeds, and whole grains.  Limit the amount of red meat and sweets that you eat.  Talk with your health care provider about whether it is safe for you to drink red wine in moderation. This means 1 glass a day for nonpregnant women and 2 glasses a day for men. A glass of wine equals 5 oz (150 mL). This information is not intended to replace advice given to you by your health care provider. Make sure you discuss any questions you have with your health care provider. Document Released: 08/02/2016 Document Revised: 09/04/2016 Document Reviewed: 08/02/2016 Elsevier Interactive Patient Education  [employer]2018 Elsevier Inc.

## 2017-12-20 NOTE — Progress Notes (Signed)
I agree with the assessment and plan as directed by NP .The patient is not known to me, but I was available for consultation .   Zareah Hunzeker, MD

## 2017-12-30 ENCOUNTER — Telehealth: Payer: Self-pay | Admitting: Nurse Practitioner

## 2017-12-30 ENCOUNTER — Telehealth: Payer: Self-pay | Admitting: Interventional Cardiology

## 2017-12-30 NOTE — Telephone Encounter (Signed)
Per Dr Prescilla Soursttey's office pt had stroke needing okay for cleaning.Instructed this needs to come from neurology appears pt seen a Beverely LowNancy Martin NP on 12-19-18 .Zack Seal/cy

## 2017-12-30 NOTE — Telephone Encounter (Signed)
Spoke with [place] and advised her that it is okay for patient to have regular teeth cleaning. Confirmed with her that is all he is having dome tomorrow.  She verbalized understanding.

## 2017-12-30 NOTE — Telephone Encounter (Signed)
New message     1. What dental office are you calling from? Dr Delma Freezettey  2. What is your office phone and fax number? [phone], fax [phone]  3. What type of procedure is the patient having performed? cleaning  4. What date is procedure scheduled or is the patient there now? 1/9  5. What is your question (ex. Antibiotics prior to procedure, holding medication-we need to know how long dentist wants pt to hold med)? Does the patient need antibiotics or medications held

## 2017-12-30 NOTE — Telephone Encounter (Signed)
Sandy with Dr. Delma Freezettey called wanting to make sure they had the all clear for a teeth cleaning tomorrow. His daughter wasn't sure since he had a stroke. She said you could just fax the answer to [phone] or call at [phone]

## 2018-01-29 IMAGING — CT CT ANGIO NECK
1 of 12 series · 5 of 33 positions shown · IV contrast (APPLIED)
Comparison: Head CT from yesterday

CLINICAL DATA: Follow-up stroke

EXAM:
CT ANGIOGRAPHY HEAD AND NECK
TECHNIQUE: Multidetector CT imaging of the head and neck was performed using
the standard protocol during bolus administration of intravenous
contrast. Multiplanar CT image reconstructions and MIPs were
obtained to evaluate the vascular anatomy. Carotid stenosis
measurements (when applicable) are obtained utilizing NASCET
criteria, using the distal internal carotid diameter as the
denominator.
CONTRAST:  50 cc Isovue 370 intravenous

[Series 11: ax thins · axial · 0.36mm/px · z∈[-280,-32]mm · 5 of 375 slices shown]
[im 63/375  soft-tissue]
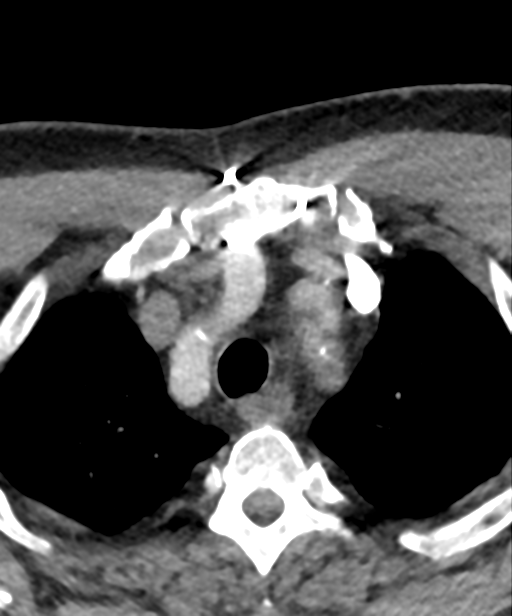
[im 125/375  bone]
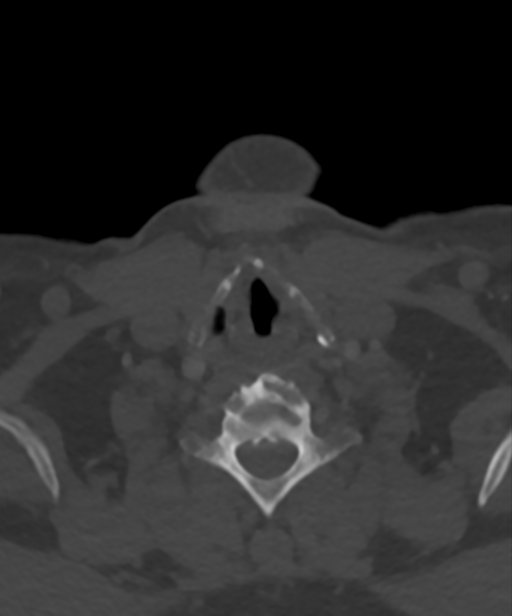
[im 188/375  soft-tissue]
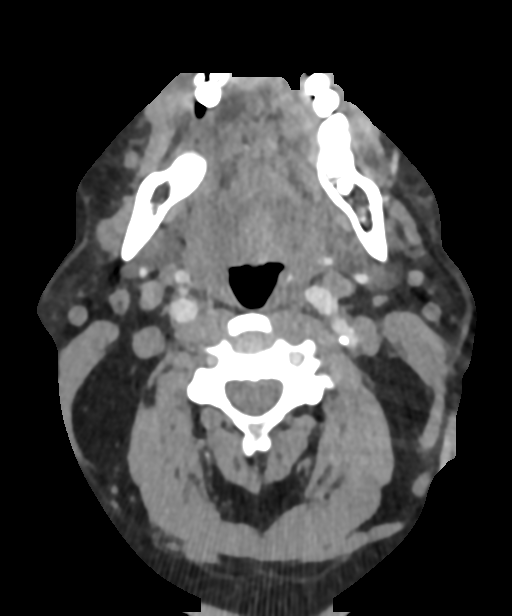
[im 250/375  bone]
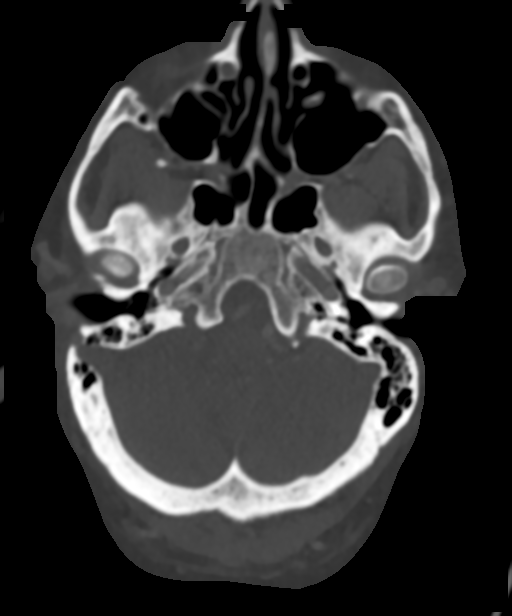
[im 312/375  soft-tissue]
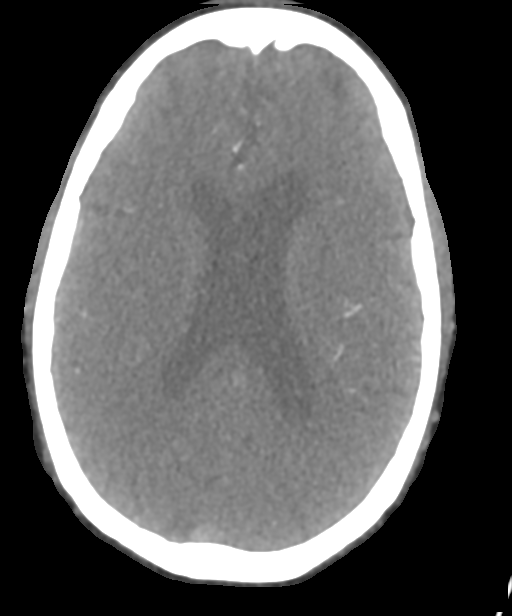

[5 of 33 positions shown; findings below may reference images not displayed]

FINDINGS: CT HEAD FINDINGS

Brain: Insular ribbon loss on the left that was questioned yesterday
is not seen today. No definite infarct. No acute hemorrhage. Remote
left midbrain infarct.

Vascular: See below

Skull: No acute or aggressive finding

Sinuses: Negative

Orbits: Right cataract resection.  No acute finding

Review of the MIP images confirms the above findings

CTA NECK FINDINGS

Aortic arch: Atherosclerosis. Three vessel branching. Partially seen
changes of CABG with patency of the visible saphenous vein graft.

Right carotid system: Mild predominately calcified plaque at the ICA
bulb without stenosis. No ulceration or dissection. Tortuosity with
ICA looping.

Left carotid system: Bulky plaque at the ICA bulb with approximately
20% stenosis. No ulceration. Negative for dissection. Plaque without
stenosis at the distal ICA.

Vertebral arteries: Proximal subclavian atherosclerosis without flow
limiting stenosis. Moderate narrowing of the left vertebral artery
origin due to atheromatous plaque. There are additional atheromatous
calcifications in the left V1 segment. Moderate narrowing of the
right vertebral artery within the C1 transverse foramen, best seen
on coronal reformats. Given the limited extent and absent neck pain
history this is likely atheromatous disease. Moderate narrowing of
the right vertebral artery as it enters the transverse foramen.

Skeleton: No acute or aggressive finding.

Other neck: No incidental mass or inflammation.

Upper chest: No acute finding.  Mild centrilobular emphysema.

Review of the MIP images confirms the above findings

CTA HEAD FINDINGS

Anterior circulation: Heavy calcified atheromatous plaque at both
siphons with high-grade bilateral mid cavernous narrowing.
Quantification is limited due to the degree of calcified plaque
blooming. Hypoplastic left A1 segment, there is an azygos type A2
segment. Atherosclerotic irregularity of bilateral MCA branches
without branch occlusion. Negative for aneurysm.

Posterior circulation: Fairly symmetric vertebral arteries.
Bilateral V4 atherosclerosis with moderate left and advanced right
stenosis at the vertebrobasilar junction. High-grade proximal
basilar stenosis with extrinsic constriction. This is where filling
defect was seen on catheter angiography in 1711. Severe left P1
segment stenosis with nonvisualized vessel. Bilateral atheromatous
irregularity of PCA branches.

Venous sinuses: Patent on the delayed phase.

Anatomic variants: None significant.

Delayed phase: No abnormal intracranial enhancement.

Review of the MIP images confirms the above findings
IMPRESSION: Head CT :

1. Questioned left insular infarct on prior head CT is not re-
demonstrated. No acute finding.
2. Remote left midbrain and cerebellar infarcts.

CTA neck:

1. Bilateral cervical carotid bifurcation atherosclerosis without
flow limiting stenosis or ulceration.
2. Moderate narrowing at the origin of the left vertebral artery.
3. Moderate narrowing of the right vertebral artery of V1/2 junction
and C1.

Intracranial CTA:

1. High-grade bilateral cavernous carotid stenosis. Quantification
is limited due to degree of calcified plaque which obscures the
lumen.
2. Severe stenosis at the right vertebrobasilar junction. Moderate
narrowing at the left vertebrobasilar junction.
3. High-grade proximal basilar stenosis. A luminal filling defect
was seen at this level on 1711 catheter angiography.
4. Severe left P1 segment stenosis with no visible flow over a short
segment.

## 2018-03-10 DIAGNOSIS — E7800 Pure hypercholesterolemia, unspecified: Secondary | ICD-10-CM | POA: Diagnosis not present

## 2018-03-10 DIAGNOSIS — E1122 Type 2 diabetes mellitus with diabetic chronic kidney disease: Secondary | ICD-10-CM | POA: Diagnosis not present

## 2018-03-10 DIAGNOSIS — Z1211 Encounter for screening for malignant neoplasm of colon: Secondary | ICD-10-CM | POA: Diagnosis not present

## 2018-03-10 DIAGNOSIS — I63422 Cerebral infarction due to embolism of left anterior cerebral artery: Secondary | ICD-10-CM | POA: Diagnosis not present

## 2018-03-10 DIAGNOSIS — Z0000 Encounter for general adult medical examination without abnormal findings: Secondary | ICD-10-CM | POA: Diagnosis not present

## 2018-03-10 DIAGNOSIS — Z7189 Other specified counseling: Secondary | ICD-10-CM | POA: Diagnosis not present

## 2018-03-10 DIAGNOSIS — Z1389 Encounter for screening for other disorder: Secondary | ICD-10-CM | POA: Diagnosis not present

## 2018-03-10 DIAGNOSIS — I25810 Atherosclerosis of coronary artery bypass graft(s) without angina pectoris: Secondary | ICD-10-CM | POA: Diagnosis not present

## 2018-03-10 DIAGNOSIS — N182 Chronic kidney disease, stage 2 (mild): Secondary | ICD-10-CM | POA: Diagnosis not present

## 2018-03-10 DIAGNOSIS — I69351 Hemiplegia and hemiparesis following cerebral infarction affecting right dominant side: Secondary | ICD-10-CM | POA: Diagnosis not present

## 2018-03-10 DIAGNOSIS — I739 Peripheral vascular disease, unspecified: Secondary | ICD-10-CM | POA: Diagnosis not present

## 2018-03-10 DIAGNOSIS — Z125 Encounter for screening for malignant neoplasm of prostate: Secondary | ICD-10-CM | POA: Diagnosis not present

## 2018-03-10 DIAGNOSIS — I10 Essential (primary) hypertension: Secondary | ICD-10-CM | POA: Diagnosis not present

## 2018-03-10 DIAGNOSIS — M109 Gout, unspecified: Secondary | ICD-10-CM | POA: Diagnosis not present

## 2018-03-10 DIAGNOSIS — M6289 Other specified disorders of muscle: Secondary | ICD-10-CM | POA: Diagnosis not present

## 2018-06-18 NOTE — Progress Notes (Signed)
[facility]  PATIENT: [name]   DOB: 22-Jul-1946   REASON FOR VISIT:  follow-up for TIA likely due to severe intracranial stenosis in Oct 2018 HISTORY FROM: Patient and wife    HISTORY OF PRESENT ILLNESS:FROM RECORDHis wife and daughters are at the bedside.  Overall he feels his condition is completely resolved. Patient and daughter stated that he had a sudden onset slurry speech, resolved in 5 minutes. EMS called, patient sent to ER. However, patient had several similar episodes after that, all resolved. Patient now at his baseline. Pt is on ASA at home.  Interval history 12/27/2018CM Mr. Kopf, 72 year old male returns for hospital follow-up for TIA likely due to severe intracranial stenosis.  CT without acute finding.  CTA head and neck bilateral high-grade stenosis with similar findings on previous study in 2012.  Carotid Doppler unremarkable.  LDL 88 hemoglobin A1c 6.3.  He remains on Plavix and aspirin for secondary stroke prevention and cardiac prevention.  He has not had further stroke or TIA symptoms.  He has minimal bruising and no bleeding.  He remains on Lipitor without myalgias.  Blood pressure in the office today 161/95.  He has an AFO to right lower extremity due to previous stroke.  He has chronic right hemiparesis and ambulates with a walker.  He is currently not receiving any rehab therapy. UPDATE 6/11/2019CM Mr. Becvar, 72 year old male returns for follow-up with history of TIA October 2018.  He is currently on Plavix for secondary stroke prevention without further stroke or TIA symptoms.  He has minimal bruising and no bleeding he remains on Lipitor without myalgias.  He claims he rides a recumbent bike every day for exercise.  Blood pressure in the office today 129/79. AFO to right LE due to previous stroke.  He has chronic right hemiparesis and ambulates with a walker.  He continues to get care through the [place] [facility] [place] and his primary  care.  He also sees Dr. Katrinka Blazing cardiologist for CAD yearly.  He returns for reevaluation without  new complaints  REVIEW OF SYSTEMS: Full 14 system review of systems performed and notable only for those listed, all others are neg:  Constitutional: neg  Cardiovascular: neg Ear/Nose/Throat: neg  Skin: neg Eyes: neg Respiratory: neg Gastroitestinal: neg  Hematology/Lymphatic: neg  Endocrine: neg Musculoskeletal: Gait abnormality Allergy/Immunology: neg Neurological: neg Psychiatric: neg Sleep : neg   ALLERGIES: No Known Allergies  HOME MEDICATIONS: Outpatient Medications Prior to Visit  Medication Sig Dispense Refill  . allopurinol (ZYLOPRIM) 100 MG tablet Take 100 mg by mouth daily.    Marland Kitchen aspirin 325 MG tablet Take 325 mg by mouth daily.    Marland Kitchen atenolol (TENORMIN) 50 MG tablet Take 0.5 tablets (25 mg total) by mouth 2 (two) times daily. 30 tablet 10  . atorvastatin (LIPITOR) 40 MG tablet Take 1 tablet (40 mg total) by mouth daily. 90 tablet 3  . clopidogrel (PLAVIX) 75 MG tablet Take 1 tablet (75 mg total) by mouth daily. 90 tablet 2  . glipiZIDE (GLUCOTROL) 5 MG tablet Take 2.5 mg by mouth 2 (two) times daily before a meal.     . lisinopril (PRINIVIL,ZESTRIL) 2.5 MG tablet Take 2.5 mg by mouth daily.     . nitroGLYCERIN (NITROSTAT) 0.4 MG SL tablet Place 1 tablet (0.4 mg total) under the tongue every 5 (five) minutes as needed for chest pain. 25 tablet 3  . tiZANidine (ZANAFLEX) 2 MG tablet TAKE 1 TABLET TWICE A DAY BEFORE A  MEAL (Patient not taking: Reported on 06/19/2018) 60 tablet 5  . UNABLE TO FIND Outpatient physical therapy  Diagnosis: TIA 1 Mutually Defined 0   No facility-administered medications prior to visit.     PAST MEDICAL HISTORY: Past Medical History:  Diagnosis Date  . Arthritis    Gout  . Coronary artery disease   . Diabetes mellitus   . Gout   . Myocardial infarction (HCC) 2011  . Peripheral vascular disease (HCC)   . Stroke [facility]) feb 2012    complication of cath    PAST SURGICAL HISTORY: Past Surgical History:  Procedure Laterality Date  . CARDIAC CATHETERIZATION     stent done feb 2012  . CORONARY ARTERY BYPASS GRAFT    . FEMORAL BYPASS Left Sept. 30, 2011   Left Fem to above knee Pop Artery BPG  . VASCULAR SURGERY      FAMILY HISTORY: Family History  Problem Relation Age of Onset  . Heart disease Mother   . Heart disease Father   . Diabetes Father   . Heart attack Father     SOCIAL HISTORY: Social History   Socioeconomic History  . Marital status: Married    Spouse name: Not on file  . Number of children: Not on file  . Years of education: Not on file  . Highest education level: Not on file  Occupational History  . Not on file  Social Needs  . Financial resource strain: Not on file  . Food insecurity:    Worry: Not on file    Inability: Not on file  . Transportation needs:    Medical: Not on file    Non-medical: Not on file  Tobacco Use  . Smoking status: Former Smoker    Years: 40.00  . Smokeless tobacco: Never Used  Substance and Sexual Activity  . Alcohol use: No    Alcohol/week: 0.0 oz  . Drug use: No  . Sexual activity: Not on file  Lifestyle  . Physical activity:    Days per week: Not on file    Minutes per session: Not on file  . Stress: Not on file  Relationships  . Social connections:    Talks on phone: Not on file    Gets together: Not on file    Attends religious service: Not on file    Active member of club or organization: Not on file    Attends meetings of clubs or organizations: Not on file    Relationship status: Not on file  . Intimate partner violence:    Fear of current or ex partner: Not on file    Emotionally abused: Not on file    Physically abused: Not on file    Forced sexual activity: Not on file  Other Topics Concern  . Not on file  Social History Narrative  . Not on file     PHYSICAL EXAM  Vitals:   06/19/18 1053  BP: 129/79  Pulse: 73  Weight:  268 lb 8 oz (121.8 kg)  Height: 6' (1.829 m)   Body mass index is 36.42 kg/m.  Generalized: Well developed, in no acute distress  Head: normocephalic and atraumatic,. Oropharynx benign  Neck: Supple, no carotid bruits  Cardiac: Regular rate rhythm, no murmur  Musculoskeletal: No deformity   Neurological examination   Mentation: Alert oriented to time, place, history taking. Attention span and concentration appropriate. Recent and remote memory intact.  Follows all commands speech and language fluent.   Cranial nerve II-XII:  Pupils were equal round reactive to light extraocular movements were full, visual field were full on confrontational test.  Right facial droop which is chronic . hearing was intact to finger rubbing bilaterally. Uvula tongue midline. head turning and shoulder shrug were normal and symmetric.Tongue protrusion into cheek strength was normal. Motor: normal bulk and tone, full strength in the BUE, BLE, on the left however RUE  3 out of 5 proximally and distally and 4 out of 5 right lower extremity with foot drop and AFO.  Sensory: normal and symmetric to light touch, pinprick, and  Vibration,  Coordination: finger-nose-finger, with significant ataxia on the right , normal on the left Reflexes: Increased on the right as compared to the left plantar responses were flexor bilaterally. Gait and Station: Rising up from seated position with pushoff , ambulated a short distance in the hall with walker, obvious with right foot drop.  No difficulty with turns.  DIAGNOSTIC DATA (LABS, IMAGING, TESTING) - I reviewed patient records, labs, notes, testing and imaging myself where available.  Lab Results  Component Value Date   WBC 6.6 10/04/2017   HGB 17.0 10/04/2017   HCT 50.0 10/04/2017   MCV 92.4 10/04/2017   PLT 202 10/04/2017      Component Value Date/Time   NA 138 10/05/2017 0523   K 4.2 10/05/2017 0523   CL 104 10/05/2017 0523   CO2 27 10/05/2017 0523   GLUCOSE 95  10/05/2017 0523   BUN 7 10/05/2017 0523   CREATININE 1.04 10/05/2017 0523   CALCIUM 8.8 (L) 10/05/2017 0523   PROT 6.7 10/04/2017 0845   ALBUMIN 3.4 (L) 10/04/2017 0845   AST 27 10/04/2017 0845   ALT 25 10/04/2017 0845   ALKPHOS 77 10/04/2017 0845   BILITOT 1.0 10/04/2017 0845   GFRNONAA >60 10/05/2017 0523   GFRAA >60 10/05/2017 0523   Lab Results  Component Value Date   CHOL 135 10/05/2017   HDL 30 (L) 10/05/2017   LDLCALC 88 10/05/2017   TRIG 87 10/05/2017   CHOLHDL 4.5 10/05/2017   Lab Results  Component Value Date   HGBA1C 6.3 (H) 10/05/2017   No results found for: [id number] Lab Results  Component Value Date   TSH 1.439 10/05/2017     ASSESSMENT AND PLAN  72 y.o. year old male with history of coronary artery disease, Diabetes mellitus, Gout, Myocardial infarction (HCC) (2011), Peripheral vascular disease (HCC), and Stroke (HCC) (feb 2012). here after recent hospitalization for TIA in October likely due to severe intracranial stenosis.  CT without acute finding.  CTA head and neck bilateral high-grade stenosis with similar findings on previous study in 2012.  Carotid Doppler unremarkable.  LDL 88 hemoglobin A1c 6.3.  He remains on Plavix and aspirin for secondary stroke prevention and cardiac prevention.The patient is a current patient of Dr. Roda ShuttersXu  who has left the practice.  This note is sent to the work in doctor.     Stressed the importance of management of risk factors to prevent further stroke Continue Plavix and for secondary stroke prevention and cardiac prevention Maintain strict control of hypertension with blood pressure goal below 130-150 systolic today's reading 129/79 continue antihypertensive medications Control of diabetes with hemoglobin A1c below 6.5 followed by primary care  continue Glucotrol Cholesterol with LDL cholesterol less than 70, followed by primary care,  continue statin drugs Lipitor Exercise by walking,recommend at least 30 min daily,  recumbent bike use  walker for safe ambulation eat healthy diet with whole grains,  fresh fruits and vegetables,  Discharge from stroke clinic [facility] and [facility]ology Nilda Riggs, [facility], [facility], APRN  [facility] [address] [place], [place] [address] [phone]

## 2018-06-19 ENCOUNTER — Encounter: Payer: Self-pay | Admitting: Nurse Practitioner

## 2018-06-19 ENCOUNTER — Ambulatory Visit (INDEPENDENT_AMBULATORY_CARE_PROVIDER_SITE_OTHER): Payer: Medicare Other | Admitting: Nurse Practitioner

## 2018-06-19 VITALS — BP 129/79 | HR 73 | Ht 72.0 in | Wt 268.5 lb

## 2018-06-19 DIAGNOSIS — E782 Mixed hyperlipidemia: Secondary | ICD-10-CM

## 2018-06-19 DIAGNOSIS — Z8673 Personal history of transient ischemic attack (TIA), and cerebral infarction without residual deficits: Secondary | ICD-10-CM | POA: Insufficient documentation

## 2018-06-19 DIAGNOSIS — I10 Essential (primary) hypertension: Secondary | ICD-10-CM | POA: Diagnosis not present

## 2018-06-19 DIAGNOSIS — R269 Unspecified abnormalities of gait and mobility: Secondary | ICD-10-CM

## 2018-06-19 NOTE — Progress Notes (Signed)
I have read the note, and I agree with the clinical assessment and plan.  Richard A. Sater, MD, PhD, FAAN Certified in Neurology, Clinical Neurophysiology, Sleep Medicine, Pain Medicine and Neuroimaging  [facility] [address], [address] [place], [place] [address] [phone]  

## 2018-06-19 NOTE — Patient Instructions (Addendum)
Stressed the importance of management of risk factors to prevent further stroke Continue Plavix and for secondary stroke prevention and cardiac prevention Maintain strict control of hypertension with blood pressure goal below 130-150 systolic today's reading 129/79 continue antihypertensive medications Control of diabetes with hemoglobin A1c below 6.5 followed by primary care  continue Glucotrol Cholesterol with LDL cholesterol less than 70, followed by primary care,  continue statin drugs Lipitor Exercise by walking,recommend at least 30 min daily, recumbent bike use  walker for safe ambulation eat healthy diet with whole grains,  fresh fruits and vegetables,  Discharge from stroke clinic [facility] with [facility] and [facility]  Stroke Prevention Some health problems and behaviors may make it more likely for you to have a stroke. Below are ways to lessen your risk of having a stroke.  Be active for at least 30 minutes on most or all days.  Do not smoke. Try not to be around others who smoke.  Do not drink too much alcohol. ? Do not have more than 2 drinks a day if you are a man. ? Do not have more than 1 drink a day if you are a woman and are not pregnant.  Eat healthy foods, such as fruits and vegetables. If you were put on a specific diet, follow the diet as told.  Keep your cholesterol levels under control through diet and medicines. Look for foods that are low in saturated fat, trans fat, cholesterol, and are high in fiber.  If you have diabetes, follow all diet plans and take your medicine as told.  Ask your doctor if you need treatment to lower your blood pressure. If you have high blood pressure (hypertension), follow all diet plans and take your medicine as told by your doctor.  If you are 6418-73 years old, have your blood pressure checked every 3-5 years. If you are age 73 or older, have your blood pressure checked every year.  Keep a healthy weight. Eat foods that are low in  calories, salt, saturated fat, trans fat, and cholesterol.  Do not take drugs.  Avoid birth control pills, if this applies. Talk to your doctor about the risks of taking birth control pills.  Talk to your doctor if you have sleep problems (sleep apnea).  Take all medicine as told by your doctor. ? You may be told to take aspirin or blood thinner medicine. Take this medicine as told by your doctor. ? Understand your medicine instructions.  Make sure any other conditions you have are being taken care of.  Get help right away if:  You suddenly lose feeling (you feel numb) or have weakness in your face, arm, or leg.  Your face or eyelid hangs down to one side.  You suddenly feel confused.  You have trouble talking (aphasia) or understanding what people are saying.  You suddenly have trouble seeing in one or both eyes.  You suddenly have trouble walking.  You are dizzy.  You lose your balance or your movements are clumsy (uncoordinated).  You suddenly have a very bad headache and you do not know the cause.  You have new chest pain.  Your heart feels like it is fluttering or skipping a beat (irregular heartbeat). Do not wait to see if the symptoms above go away. Get help right away. Call your local emergency services (911 in [place] Do not drive yourself to the hospital. This information is not intended to replace advice given to you by your health care provider.  Make sure you discuss any questions you have with your health care provider. Document Released: 06/10/2012 Document Revised: 05/17/2016 Document Reviewed: 06/12/2013 Elsevier Interactive Patient Education  [employer].

## 2018-11-13 ENCOUNTER — Encounter: Payer: Self-pay | Admitting: Family

## 2018-11-13 ENCOUNTER — Ambulatory Visit (INDEPENDENT_AMBULATORY_CARE_PROVIDER_SITE_OTHER): Payer: Medicare Other | Admitting: Family

## 2018-11-13 ENCOUNTER — Ambulatory Visit (HOSPITAL_COMMUNITY)
Admission: RE | Admit: 2018-11-13 | Discharge: 2018-11-13 | Disposition: A | Discharge status: Home / Self Care | Payer: Medicare Other | Source: Ambulatory Visit | Attending: Family | Admitting: Family

## 2018-11-13 ENCOUNTER — Ambulatory Visit (INDEPENDENT_AMBULATORY_CARE_PROVIDER_SITE_OTHER)
Admission: RE | Admit: 2018-11-13 | Discharge: 2018-11-13 | Disposition: A | Discharge status: Home / Self Care | Payer: Medicare Other | Source: Ambulatory Visit | Attending: Family | Admitting: Family

## 2018-11-13 ENCOUNTER — Other Ambulatory Visit: Payer: Self-pay

## 2018-11-13 VITALS — BP 134/85 | HR 63 | Temp 97.1°F | Resp 16 | Ht 72.0 in | Wt 262.0 lb

## 2018-11-13 DIAGNOSIS — Z87891 Personal history of nicotine dependence: Secondary | ICD-10-CM | POA: Diagnosis not present

## 2018-11-13 DIAGNOSIS — I779 Disorder of arteries and arterioles, unspecified: Secondary | ICD-10-CM

## 2018-11-13 DIAGNOSIS — Z95828 Presence of other vascular implants and grafts: Secondary | ICD-10-CM

## 2018-11-13 NOTE — Patient Instructions (Signed)

## 2018-11-13 NOTE — Progress Notes (Signed)
[facility] [place]   CC: Follow up peripheral artery occlusive disease  History of Present Illness [name] is a 72 y.o. male who is status post left femoropopliteal bypass graft in 2011 by Dr. Myra GianottiBrabham.  He returns today for follow up. He reports that he had a stroke during his cardiac stent placement in 2012, no further stroke symptoms since 2012, carotid Duplex in 2014 revealed minimal bilateral ICA stenosis. He has right leg and arm residual weakness, has mild right facial droop, he is able to cut grass with his riding lawn mower. He denies claudication symptoms with walking, he is continuing to work on his rehab at home. He denies non-healing wounds. He is exercising daily including 3-5 miles daily on a recumbent bike. He plans to start using his treadmill.   Pt states his systolic blood pressure at home is about 130, states it increases in a medical setting.   He fell on his coccyx last week when his rolling walker with a seat rolled away when he tried to sit.   Diabetic:6.3 A1C on 10-05-17 Tobacco use: former smoker, quit 1994  Pt meds include:  Statin :Yes Betablocker: Yes ASA: Yes, 325 mg Other anticoagulants/antiplatelets: Plavix   Past Medical History:  Diagnosis Date  . Arthritis    Gout  . Coronary artery disease   . Diabetes mellitus   . Gout   . Myocardial infarction (HCC) 2011  . Peripheral vascular disease (HCC)   . Stroke [facility](HCC) feb 2012   complication of cath    Social History Social History   Tobacco Use  . Smoking status: Former Smoker    Years: 40.00  . Smokeless tobacco: Never Used  Substance Use Topics  . Alcohol use: No    Alcohol/week: 0.0 standard drinks  . Drug use: No    Family History Family History  Problem Relation Age of Onset  . Heart disease Mother   . Heart disease Father   . Diabetes Father   . Heart attack Father     Past Surgical History:  Procedure Laterality Date   . CARDIAC CATHETERIZATION     stent done feb 2012  . CORONARY ARTERY BYPASS GRAFT    . FEMORAL BYPASS Left Sept. 30, 2011   Left Fem to above knee Pop Artery BPG  . VASCULAR SURGERY      No Known Allergies  Current Outpatient Medications  Medication Sig Dispense Refill  . allopurinol (ZYLOPRIM) 100 MG tablet Take 100 mg by mouth daily.    Marland Kitchen. aspirin 325 MG tablet Take 325 mg by mouth daily.    Marland Kitchen. atenolol (TENORMIN) 50 MG tablet Take 0.5 tablets (25 mg total) by mouth 2 (two) times daily. 30 tablet 10  . atorvastatin (LIPITOR) 40 MG tablet Take 1 tablet (40 mg total) by mouth daily. 90 tablet 3  . clopidogrel (PLAVIX) 75 MG tablet Take 1 tablet (75 mg total) by mouth daily. 90 tablet 2  . glipiZIDE (GLUCOTROL) 5 MG tablet Take 2.5 mg by mouth 2 (two) times daily before a meal.     . lisinopril (PRINIVIL,ZESTRIL) 2.5 MG tablet Take 2.5 mg by mouth daily.     . nitroGLYCERIN (NITROSTAT) 0.4 MG SL tablet Place 1 tablet (0.4 mg total) under the tongue every 5 (five) minutes as needed for chest pain. 25 tablet 3  . tiZANidine (ZANAFLEX) 2 MG tablet TAKE 1 TABLET TWICE A DAY BEFORE A MEAL 60 tablet 5   No current facility-administered  medications for this visit.     ROS: See HPI for pertinent positives and negatives.   Physical Examination  Vitals:   11/13/18 1117 11/13/18 1119  BP: (!) 151/87 134/85  Pulse: 63   Resp: 16   Temp: (!) 97.1 F (36.2 C)   TempSrc: Oral   SpO2: 96%   Weight: 262 lb (118.8 kg)   Height: 6' (1.829 m)    Body mass index is 35.53 kg/m.  General: A&O x 3, WDWN, obese male. Gait: using rolling walker with seat, weakness evident in right leg HENT: No gross abnormalities.  Eyes: PERRLA. Pulmonary: Respirations are non labored, CTAB, good air movement in all fields Cardiac: regular rhythm, no detected murmur.         Carotid Bruits Right Left   Negative Negative   Radial pulses are 2+ palpable bilaterally   Adominal aortic pulse is not palpable                          VASCULAR EXAM: Extremities without ischemic changes, without Gangrene; without open wounds.                                                                                                          LE Pulses Right Left       FEMORAL  not palpable seated  not palpable        POPLITEAL  not palpable   not palpable       POSTERIOR TIBIAL  not palpable   not palpable        DORSALIS PEDIS      ANTERIOR TIBIAL  palpable   palpable    Abdomen: soft, NT, no palpable masses. Skin: no rashes, no cellulitis, no ulcers noted. Musculoskeletal: no muscle wasting or atrophy.  Neurologic: A&O X 3; appropriate affect, Sensation is normal;  motor strength 4/5 in right upper extremity, 3/5 in right LE, 5/5 in left upper and lower extremities. Speech is fluent/aphasic. CN 2-12 intact except mild right facial droop with smile, right shoulder shrug is less than left. Psychiatric: Thought content is normal, mood appropriate for clinical situation.    ASSESSMENT: [name] is a 72 y.o. male whois status post left femoropopliteal bypass graft in 2011. He had a stroke during his cardiac stent placement in 2012, no further stroke symptoms since 2012, carotid Duplex in 2014 revealed minimal bilateral ICA stenosis. He has right leg and arm residual weakness, has mild right facial droop, he is able to cut grass with his riding lawn mower. He has no claudication symptoms with walking, he is continuing to work on his rehab at home, walks with a walker. He has no signs of ischemia in his legs/feet. He is exercising daily including 3-5 miles daily on a recumbent bike.  DATA  Left LE Arterial Duplex (11-13-18):  Patent left femoral to popliteal artery bypass graft with no evidence for restenosis.  Highest velocity is 199 cm/s at the left inflow.  Waveforms are tri and biphasic from the inflow  to the distal graft, monophasic at the distal anastomosis and the outflow.   No change  since the exams on 11-05-16 and 11-11-17.    ABI (Date: 11/13/2018):  R:   ABI: 0.98 (was 1.01 on 11-11-17),   PT: tri  DP: tri  TBI:  1.02 (was 0.92)  L:   ABI: 0.95 (was 1.08),   PT: tri  DP: tri  TBI: 0.90, (was 0.90) Bilateral ABI and TBI remain normal with all triphasic waveforms.     PLAN:  Continue extensive exercising. Based on the patient's vascular studies and examination, pt will return to clinic in1 year withleft LE arterial Duplex and ABI's.  I discussed in depth with the patient the nature of atherosclerosis, and emphasized the importance of maximal medical management including strict control of blood pressure, blood glucose, and lipid levels, obtaining regular exercise, and continued cessation of smoking.  The patient is aware that without maximal medical management the underlying atherosclerotic disease process will progress, limiting the benefit of any interventions.  The patient was given information about PAD including signs, symptoms, treatment, what symptoms should prompt the patient to seek immediate medical care, and risk reduction measures to take.  Charisse March, RN, MSN, FNP-C [facility] Phone: [phone]  Clinic MD: [facility]  11/13/18 11:36 AM

## 2019-04-21 ENCOUNTER — Other Ambulatory Visit: Payer: Self-pay

## 2019-04-21 ENCOUNTER — Encounter: Payer: Self-pay | Admitting: *Deleted

## 2019-04-21 NOTE — Patient Outreach (Signed)
[facility]) Care Management  04/21/2019  LATHANIEL BONFIELD 09/09/1946 [id number]   Successful outreach to patient regarding social work referral for assistance with Advance Directives.  BSW explained purpose of call to patient.  BSW talked with patient about HC POA and Living Will.  Patient was very polite but slightly resistant to conversation stating "I hope I don't die anytime soon."  BSW informed him that the health system encourages patients of all ages and health status to complete these documents, however, it is a personal decision about whether or not to complete them. Patient did agree to [facility] sending EMMI and packet.  Will follow up within the next two weeks to ensure receipt.  Malachy Chamber, BSW Social Worker [phone]

## 2019-05-04 ENCOUNTER — Other Ambulatory Visit: Payer: Self-pay

## 2019-05-04 NOTE — Patient Outreach (Signed)
[facility]) Care Management  05/04/2019  PANTELIS CASTRILLO [birth year] [id number]   Successful outreach to patient to ensure receipt of Adavnce Directives EMMI and packet that were mailed on 04/21/19.  Patient confirmed receipt.  He denied any questions and declined further review of documents via phone.  BSW is closing case but encouraged him to call if questions arise.  Malachy Chamber, BSW Social Worker [phone]

## 2019-05-13 ENCOUNTER — Other Ambulatory Visit: Payer: Self-pay

## 2019-05-13 NOTE — Patient Outreach (Signed)
Late entry: [employer] advantage engagement program:  Placed call to patient on 03/25/2019.  Spoke with patient who reports regular follow up with MD. Patient indicated interest in Advanced directives.    Reviewed chart: [facility] social worker sent packet and confirmed he received packet.  Plan to mail welcome letter and outreach patient in October 2020.  Rowe Pavy, RN, BSN, CEN [facility] [phone].

## 2019-06-11 ENCOUNTER — Other Ambulatory Visit: Payer: Self-pay

## 2019-06-11 NOTE — Patient Outreach (Signed)
[employer]:  Patient transitioned to [facility].  PLAN: close case and send MD letter of closure.  Tomasa Rand, RN, BSN, CEN [facility] [employer] [phone]

## 2019-07-31 ENCOUNTER — Emergency Department (HOSPITAL_COMMUNITY)
Admission: EM | Admit: 2019-07-31 | Discharge: 2019-08-01 | Disposition: A | Discharge status: Home / Self Care | Payer: No Typology Code available for payment source | Attending: Emergency Medicine | Admitting: Emergency Medicine

## 2019-07-31 ENCOUNTER — Encounter (HOSPITAL_COMMUNITY): Payer: Self-pay

## 2019-07-31 ENCOUNTER — Other Ambulatory Visit: Payer: Self-pay

## 2019-07-31 DIAGNOSIS — Y999 Unspecified external cause status: Secondary | ICD-10-CM | POA: Insufficient documentation

## 2019-07-31 DIAGNOSIS — Z7984 Long term (current) use of oral hypoglycemic drugs: Secondary | ICD-10-CM | POA: Diagnosis not present

## 2019-07-31 DIAGNOSIS — Y9301 Activity, walking, marching and hiking: Secondary | ICD-10-CM | POA: Insufficient documentation

## 2019-07-31 DIAGNOSIS — S91114A Laceration without foreign body of right lesser toe(s) without damage to nail, initial encounter: Secondary | ICD-10-CM | POA: Insufficient documentation

## 2019-07-31 DIAGNOSIS — W1830XA Fall on same level, unspecified, initial encounter: Secondary | ICD-10-CM | POA: Diagnosis not present

## 2019-07-31 DIAGNOSIS — I252 Old myocardial infarction: Secondary | ICD-10-CM | POA: Insufficient documentation

## 2019-07-31 DIAGNOSIS — I2510 Atherosclerotic heart disease of native coronary artery without angina pectoris: Secondary | ICD-10-CM | POA: Diagnosis not present

## 2019-07-31 DIAGNOSIS — Z7901 Long term (current) use of anticoagulants: Secondary | ICD-10-CM | POA: Insufficient documentation

## 2019-07-31 DIAGNOSIS — E119 Type 2 diabetes mellitus without complications: Secondary | ICD-10-CM | POA: Insufficient documentation

## 2019-07-31 DIAGNOSIS — Z87891 Personal history of nicotine dependence: Secondary | ICD-10-CM | POA: Insufficient documentation

## 2019-07-31 DIAGNOSIS — I739 Peripheral vascular disease, unspecified: Secondary | ICD-10-CM | POA: Diagnosis not present

## 2019-07-31 DIAGNOSIS — Y92012 Bathroom of single-family (private) house as the place of occurrence of the external cause: Secondary | ICD-10-CM | POA: Diagnosis not present

## 2019-07-31 DIAGNOSIS — I10 Essential (primary) hypertension: Secondary | ICD-10-CM | POA: Insufficient documentation

## 2019-07-31 DIAGNOSIS — Z23 Encounter for immunization: Secondary | ICD-10-CM | POA: Diagnosis not present

## 2019-07-31 NOTE — ED Triage Notes (Signed)
Pt states that he had a mechanical fall in bathroom and fell cutting his R foot, did not hit head, no LOC, last tetanus unknown, bleeding controlled, no laceration seen,appears bleeding was coming from pinky toe and not bleeding now

## 2019-08-01 DIAGNOSIS — S91114A Laceration without foreign body of right lesser toe(s) without damage to nail, initial encounter: Secondary | ICD-10-CM | POA: Diagnosis not present

## 2019-08-01 MED ORDER — TETANUS-DIPHTH-ACELL PERTUSSIS 5-2.5-18.5 LF-MCG/0.5 IM SUSP
0.5000 mL | Freq: Once | INTRAMUSCULAR | Status: AC
  Administered 2019-08-01: 0.5 mL via INTRAMUSCULAR
  Filled 2019-08-01: qty 0.5

## 2019-08-01 MED ORDER — LIDOCAINE-EPINEPHRINE-TETRACAINE (LET) SOLUTION
3.0000 mL | Freq: Once | NASAL | Status: AC
  Administered 2019-08-01: 3 mL via TOPICAL
  Filled 2019-08-01: qty 3

## 2019-08-01 MED ORDER — MEDICAL COMPRESSION STOCKINGS MISC: 0 refills | Status: DC

## 2019-08-01 NOTE — Discharge Instructions (Signed)
Try to keep foot clean and dry.  After showering, dry foot well with towel and let air dry for a bit before putting on socks. Follow-up with your primary care doctor to have stitch removed in about a week. Return here for any new/acute changes.

## 2019-08-01 NOTE — ED Notes (Signed)
Pt fell in [place] tonight  He has a LACERATION BETWEEN HIS RT 4TH AND 5TH TOES  MINIMAL BLEEDING AT PRESENT.Marland Kitchen LACERATION CLEANED WITH SOAP AND WATER

## 2019-08-01 NOTE — ED Provider Notes (Signed)
[facility] EMERGENCY DEPARTMENT Provider Note   CSN: [id number] [facility] date & time: 07/31/19  2311     History   Chief Complaint Chief Complaint  Patient presents with  . Laceration    HPI Zenaida DeedClarence M Graber is a 73 y.o. male.     The history is provided by the patient and medical records.  Laceration    73 y.o. M with hx of CAD, DM, gout, prior stroke, PVD, presenting to the ED for cut on right foot.  States he was going to the bathroom and his feet got tangled up beneath him causing him to lose his balance.  No head injury or LOC.  He thinks he cut his right foot as he had some bleeding but unsure from where.  He denies any pain of this area.  Unsure of last tetanus vaccine.  Past Medical History:  Diagnosis Date  . Arthritis    Gout  . Coronary artery disease   . Diabetes mellitus   . Gout   . Myocardial infarction (HCC) 2011  . Peripheral vascular disease (HCC)   . Stroke [facility](HCC) feb 2012   complication of cath    Patient Active Problem List   Diagnosis Date Noted  . History of stroke 06/19/2018  . History of TIA (transient ischemic attack) 06/19/2018  . Gait abnormality 06/19/2018  . TIA (transient ischemic attack) 12/19/2017  . Stroke (HCC) 10/04/2017  . DM (diabetes mellitus) (HCC) 03/18/2015  . Essential hypertension 03/18/2015  . HLD (hyperlipidemia) 03/18/2015  . Aftercare following surgery of the circulatory system 10/25/2014  . PVD (peripheral vascular disease) (HCC) 10/25/2014  . Occlusion and stenosis of carotid artery without mention of cerebral infarction 11/16/2013  . Aftercare following surgery of the circulatory system, NEC 10/19/2013  . Peripheral vascular disease, unspecified (HCC) 10/16/2012  . Venous insufficiency 02/19/2012  . CVA, old, hemiparesis (HCC) 02/18/2012  . CAD (coronary artery disease) of artery bypass graft 02/18/2012  . Cellulitis 02/17/2012  . 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 CARDIAC CATHETERIZATION     stent done feb 2012  . CORONARY ARTERY BYPASS GRAFT    . FEMORAL BYPASS Left Sept. 30, 2011   Left Fem to above knee Pop Artery BPG  . VASCULAR SURGERY          Home Medications    Prior to Admission medications   Medication Sig Start Date End Date Taking? Authorizing Provider  allopurinol (ZYLOPRIM) 100 MG tablet Take 100 mg by mouth daily.    Kirsteins, Victorino SparrowAndrew E, MD  aspirin 325 MG tablet Take 325 mg by mouth daily.    [provider]  atenolol (TENORMIN) 50 MG tablet Take 0.5 tablets (25 mg total) by mouth 2 (two) times daily. 11/10/13   Lyn RecordsSmith, Henry W, MD  atorvastatin (LIPITOR) 40 MG tablet Take 1 tablet (40 mg total) by mouth daily. 03/22/15   Marvel PlanXu, Jindong, MD  clopidogrel (PLAVIX) 75 MG tablet Take 1 tablet (75 mg total) by mouth daily. 10/06/17   Rai, Ripudeep K, MD  glipiZIDE (GLUCOTROL) 5 MG tablet Take 2.5 mg by mouth 2 (two) times daily before a meal.     [provider]  lisinopril (PRINIVIL,ZESTRIL) 2.5 MG tablet Take 2.5 mg by mouth daily.     [provider]  nitroGLYCERIN (NITROSTAT) 0.4 MG SL tablet Place 1 tablet (0.4 mg total) under the tongue every  5 (five) minutes as needed for chest pain. 07/19/14   Belva Crome, MD  tiZANidine (ZANAFLEX) 2 MG tablet TAKE 1 TABLET TWICE A DAY BEFORE A MEAL 07/19/15   Kirsteins, Luanna Salk, MD    Family History Family History  Problem Relation Age of Onset  . Heart disease Mother   . Heart disease Father   . Diabetes Father   . Heart attack Father     Social History Social History   Tobacco Use  . Smoking status: Former Smoker    Years: 40.00  . Smokeless tobacco: Never Used  Substance Use Topics  . Alcohol use: No    Alcohol/week: 0.0 standard drinks  . Drug use: No     Allergies   Patient has no known allergies.   Review of Systems Review of Systems  Skin: Positive  for wound.  All other systems reviewed and are negative.    Physical Exam Updated Vital Signs BP (!) 152/69   Pulse 73   Temp 98 F (36.7 C) (Oral)   Resp 20   SpO2 97%   Physical Exam Vitals signs and nursing note reviewed.  Constitutional:      Appearance: He is well-developed.  HENT:     Head: Normocephalic and atraumatic.     Comments: No visible signs of head trauma Eyes:     Conjunctiva/sclera: Conjunctivae normal.     Pupils: Pupils are equal, round, and reactive to light.  Neck:     Musculoskeletal: Normal range of motion.  Cardiovascular:     Rate and Rhythm: Normal rate and regular rhythm.     Heart sounds: Normal heart sounds.  Pulmonary:     Effort: Pulmonary effort is normal.     Breath sounds: Normal breath sounds.  Abdominal:     General: Bowel sounds are normal.     Palpations: Abdomen is soft.  Musculoskeletal: Normal range of motion.       Feet:     Comments: 1cm superficial laceration of solar aspect right 4th toe; no active bleeding; no bony deformity; normal distal sensation and perfusion  Skin:    General: Skin is warm and dry.  Neurological:     Mental Status: He is alert and oriented to person, place, and time.     Comments: AAOx3, answering questions and following commands appropriately, normal speech      ED Treatments / Results  Labs (all labs ordered are listed, but only abnormal results are displayed) Labs Reviewed - No data to display  EKG None  Radiology No results found.  Procedures Procedures (including critical care time)  LACERATION REPAIR Performed by: Larene Pickett Authorized by: Larene Pickett Consent: Verbal consent obtained. Risks and benefits: risks, benefits and alternatives were discussed Consent given by: patient Patient identity confirmed: provided demographic data Prepped and Draped in normal sterile fashion Wound explored  Laceration Location: right 4th toe  Laceration Length: 1cm  No Foreign  Bodies seen or palpated  Anesthesia: topical  Local anesthetic: LET  Anesthetic total: 3 ml  Irrigation method: syringe Amount of cleaning: standard  Skin closure: 4-0 prolene  Number of sutures: 1  Technique: simple interrupted  Patient tolerance: Patient tolerated the procedure well with no immediate complications.   Medications Ordered in ED Medications  Tdap (BOOSTRIX) injection 0.5 mL (0.5 mLs Intramuscular Given 08/01/19 0024)  lidocaine-EPINEPHrine-tetracaine (LET) solution (3 mLs Topical Given 08/01/19 0020)     Initial Impression / Assessment and Plan / ED Course  I have reviewed the triage vital signs and the nursing notes.  Pertinent labs & imaging results that were available during my care of the patient were reviewed by me and considered in my medical decision making (see chart for details).  73 y.o. M here with right foot laceration.  States he got his feet tangled up and lost his footing in the bathroom.  No head injury or LOC.  Has 1cm laceration to dorsal aspect of right 4th toe.  No active bleeding.  Tetanus updated.  Laceration repaired as above.  Discussed home wound care and follow-up with PCP for suture removal in 1 week.  Return here for any new/acute changes.  Patient did request prescription for compression stockings, his at home are too small.  This was written for him.  Final Clinical Impressions(s) / ED Diagnoses   Final diagnoses:  Laceration of lesser toe of right foot without foreign body present or damage to nail, initial encounter    ED Discharge Orders         Ordered    Elastic Bandages & Supports (MEDICAL COMPRESSION STOCKINGS) MISC     08/01/19 0010           Garlon HatchetSanders,  M, PA-C 08/01/19 0050    Dione BoozeGlick, David, MD 08/01/19 [phone]

## 2019-09-15 ENCOUNTER — Encounter (HOSPITAL_COMMUNITY): Payer: Self-pay

## 2019-09-15 ENCOUNTER — Ambulatory Visit (HOSPITAL_COMMUNITY)
Admission: EM | Admit: 2019-09-15 | Discharge: 2019-09-15 | Disposition: A | Discharge status: Home / Self Care | Payer: No Typology Code available for payment source

## 2019-09-15 NOTE — ED Notes (Signed)
I removed 1 stitch  On the right 4th toe, wound was dry and well-healed.

## 2019-09-15 NOTE — ED Triage Notes (Signed)
Patient presents to the UC for suture removal on the right 4th toe.

## 2019-09-24 ENCOUNTER — Ambulatory Visit: Payer: Medicare Other

## 2020-09-30 ENCOUNTER — Ambulatory Visit: Payer: No Typology Code available for payment source | Attending: Internal Medicine

## 2020-09-30 DIAGNOSIS — Z23 Encounter for immunization: Secondary | ICD-10-CM

## 2020-09-30 NOTE — Progress Notes (Signed)
   Covid-19 Vaccination Clinic  Name:  SIE FORMISANO    MRN: [id number] DOB: Nov 27, 1946  09/30/2020  Mr. Kubisiak was observed post Covid-19 immunization for 15 minutes without incident. He was provided with Vaccine Information Sheet and instruction to access the V-Safe system.   Mr. Dais was instructed to call 911 with any severe reactions post vaccine: Marland Kitchen Difficulty breathing  . Swelling of face and throat  . A fast heartbeat  . A bad rash all over body  . Dizziness and weakness

## 2022-01-12 DIAGNOSIS — E1151 Type 2 diabetes mellitus with diabetic peripheral angiopathy without gangrene: Secondary | ICD-10-CM | POA: Diagnosis not present

## 2022-01-12 DIAGNOSIS — E785 Hyperlipidemia, unspecified: Secondary | ICD-10-CM | POA: Diagnosis not present

## 2022-01-12 DIAGNOSIS — E1169 Type 2 diabetes mellitus with other specified complication: Secondary | ICD-10-CM | POA: Diagnosis not present

## 2022-01-12 DIAGNOSIS — I10 Essential (primary) hypertension: Secondary | ICD-10-CM | POA: Diagnosis not present

## 2022-01-12 DIAGNOSIS — M109 Gout, unspecified: Secondary | ICD-10-CM | POA: Diagnosis not present

## 2022-01-12 DIAGNOSIS — Z7982 Long term (current) use of aspirin: Secondary | ICD-10-CM | POA: Diagnosis not present

## 2022-01-12 DIAGNOSIS — I2510 Atherosclerotic heart disease of native coronary artery without angina pectoris: Secondary | ICD-10-CM | POA: Diagnosis not present

## 2022-01-12 DIAGNOSIS — I252 Old myocardial infarction: Secondary | ICD-10-CM | POA: Diagnosis not present

## 2022-01-12 DIAGNOSIS — Z7902 Long term (current) use of antithrombotics/antiplatelets: Secondary | ICD-10-CM | POA: Diagnosis not present

## 2022-01-12 DIAGNOSIS — E1136 Type 2 diabetes mellitus with diabetic cataract: Secondary | ICD-10-CM | POA: Diagnosis not present

## 2022-01-12 DIAGNOSIS — I69351 Hemiplegia and hemiparesis following cerebral infarction affecting right dominant side: Secondary | ICD-10-CM | POA: Diagnosis not present

## 2023-04-22 DIAGNOSIS — E669 Obesity, unspecified: Secondary | ICD-10-CM | POA: Diagnosis not present

## 2023-04-22 DIAGNOSIS — G8929 Other chronic pain: Secondary | ICD-10-CM | POA: Diagnosis not present

## 2023-04-22 DIAGNOSIS — Z7982 Long term (current) use of aspirin: Secondary | ICD-10-CM | POA: Diagnosis not present

## 2023-04-22 DIAGNOSIS — I4891 Unspecified atrial fibrillation: Secondary | ICD-10-CM | POA: Diagnosis not present

## 2023-04-22 DIAGNOSIS — Z7902 Long term (current) use of antithrombotics/antiplatelets: Secondary | ICD-10-CM | POA: Diagnosis not present

## 2023-04-22 DIAGNOSIS — E1139 Type 2 diabetes mellitus with other diabetic ophthalmic complication: Secondary | ICD-10-CM | POA: Diagnosis not present

## 2023-04-22 DIAGNOSIS — F330 Major depressive disorder, recurrent, mild: Secondary | ICD-10-CM | POA: Diagnosis not present

## 2023-04-22 DIAGNOSIS — E785 Hyperlipidemia, unspecified: Secondary | ICD-10-CM | POA: Diagnosis not present

## 2023-04-22 DIAGNOSIS — M109 Gout, unspecified: Secondary | ICD-10-CM | POA: Diagnosis not present

## 2023-04-22 DIAGNOSIS — E1169 Type 2 diabetes mellitus with other specified complication: Secondary | ICD-10-CM | POA: Diagnosis not present

## 2023-04-22 DIAGNOSIS — I10 Essential (primary) hypertension: Secondary | ICD-10-CM | POA: Diagnosis not present

## 2023-08-03 ENCOUNTER — Emergency Department
Admission: EM | Admit: 2023-08-03 | Discharge: 2023-08-25 | Disposition: E | Payer: No Typology Code available for payment source | Attending: Emergency Medicine | Admitting: Emergency Medicine

## 2023-08-03 DIAGNOSIS — I469 Cardiac arrest, cause unspecified: Secondary | ICD-10-CM | POA: Diagnosis present

## 2023-08-25 NOTE — ED Provider Notes (Signed)
[facility] Provider Note    Event Date/Time   First MD Initiated Contact with Patient 08/22/2023 1712     (approximate)   History   Cardiopulmonary arrest   HPI  [name] is a 77 y.o. male  who presents to the emergency department via EMS as emergency traffic.  EMS reports that they got a call about the patient slumping over and becoming unresponsive.  Upon their arrival the patient was found to be in V-fib.  EMS gave multiple rounds of epi as well as multiple cardiac shocks without any change in rhythm.  Patient had an Igel placed in the field.  Per EMS patient has a history of cardiac disease as well as stroke.     Physical Exam   Triage Vital Signs: ED Triage Vitals  Encounter Vitals Group     BP      Systolic BP Percentile      Diastolic BP Percentile      Pulse      Resp      Temp      Temp src      SpO2      Weight      Height      Head Circumference      Peak Flow      Pain Score      Pain Loc      Pain Education      Exclude from Growth Chart     Most recent vital signs: There were no vitals filed for this visit.  General: Unresponsive CV:  Pulseless Resp:  Igel in place. Abd:  No distention.     ED Results / Procedures / Treatments   Labs (all labs ordered are listed, but only abnormal results are displayed) Labs Reviewed - No data to display   EKG  None   RADIOLOGY None   PROCEDURES:  Critical Care performed: Yes  CRITICAL CARE Performed by: Phineas Semen   Total critical care time: 25 minutes  Critical care time was exclusive of separately billable procedures and treating other patients.  Critical care was necessary to treat or prevent imminent or life-threatening deterioration.  Critical care was time spent personally by me on the following activities: development of treatment plan with patient and/or surrogate as well as nursing, discussions with consultants, evaluation of patient's  response to treatment, examination of patient, obtaining history from patient or surrogate, ordering and performing treatments and interventions, ordering and review of laboratory studies, ordering and review of radiographic studies, pulse oximetry and re-evaluation of patient's condition.   Procedures  INTUBATION Performed by: Phineas Semen  Required items: required blood products, implants, devices, and special equipment available Patient identity confirmed: provided demographic data and hospital-assigned identification number Time out: Immediately prior to procedure a "time out" was called to verify the correct patient, procedure, equipment, support staff and site/side marked as required.  Indications: cardiopulmonary arrest  Intubation method: Glidescope Laryngoscopy   Preoxygenation: I-gel  Sedatives: None Paralytic: None  Tube Size: 7.5 cuffed  Post-procedure assessment: chest rise and ETCO2 monitor Breath sounds: equal and absent over the epigastrium Tube secured with: ETT holder  Patient tolerated the procedure well with no immediate complications.  Cardiopulmonary Resuscitation (CPR) Procedure Note Directed/Performed by: Phineas Semen I personally directed ancillary staff and/or performed CPR in an effort to regain return of spontaneous circulation and to maintain cardiac, neuro and systemic perfusion.    MEDICATIONS ORDERED IN ED: Medications - No data to  display   IMPRESSION / MDM / ASSESSMENT AND PLAN / ED COURSE  I reviewed the triage vital signs and the nursing notes.                              Differential diagnosis includes, but is not limited to, CVA, ACS, arrythmia  Patient's presentation is most consistent with acute presentation with potential threat to life or bodily function.   The patient is on the cardiac monitor to evaluate for evidence of arrhythmia and/or significant heart rate changes.  Patient presented to the emergency department  today after cardiopulmonary arrest.  CPR was ongoing at time of arrival.  Initial rhythm here in the emergency department was V-fib. The i-gel was switched out for an ET tube given concern about good air seal.   He was given multiple rounds of epinephrine and had multiple shocks delivered without any change in rhythm.  Given lack of response to CPR and downtime did feel any further attempts would be futile.  Please see nursing documentation for exact timeline and medications delivered.      FINAL CLINICAL IMPRESSION(S) / ED DIAGNOSES   Final diagnoses:  Cardiopulmonary arrest [facility])     Note:  This document was prepared using Dragon voice recognition software and may include unintentional dictation errors.    Phineas Semen, MD 08/09/2023 2018

## 2023-08-25 NOTE — ED Notes (Signed)
1650 EMS Samuel Bouche removed, CPR in progress 1651 IV - 20g left ac 1652 - 1 epi given 1653 - 1 bicarb 1655 - pulse check 1655 - shock delivered @ 200 1656 - IV 20g left hand 1656 - 1 epi 1659 - pulse check 1659 - 1 epi 1700 - shock delivered x2 1700 - 1 epi 1701 - calcium given 1702 - pulse check 1703 - shock delivered  x2 1703 - 1 epi

## 2023-08-25 NOTE — ED Notes (Addendum)
Pt in [place], on [place] with EMS, shocked 11 times with [facility]  Hx stroke, heart attack

## 2023-08-25 NOTE — Progress Notes (Signed)
   08/04/2023 1715  Spiritual Encounters  Type of Visit Initial  Care provided to: [facility] partners present during encounter Nurse;Physician  Referral source Nurse (RN/NT/LPN)  Reason for visit Patient death  OnCall Visit No   Chaplain responded to a call for family support. The patient [name] was returning from a family reunion with his sister. Family thought he was going to be ok but that was not the case and  everyone struggled from the shock of the his death when the Dr. Shared this information with them.  I stayed with the family which was grieving appropriately and allowed them to take all the time they wanted. Family was supporting one another eventually and after leaving placement cards with one of the patients daughters I departed assuring them they could stay for a while if they wished to.   Valerie Roys [facility] [phone]

## 2023-08-25 NOTE — ED Notes (Addendum)
1705 - pulse check  Time of death called at 1705 by Derrill Kay, MD

## 2023-08-25 DEATH — deceased
